# Patient Record
Sex: Male | Born: 1960 | Race: White | Hispanic: No | Smoking: Current every day smoker
Health system: Southern US, Community
[De-identification: ages and names within clinical notes are randomized; demographics above are authoritative.]

## PROBLEM LIST (undated history)

## (undated) DIAGNOSIS — R0600 Dyspnea, unspecified: Secondary | ICD-10-CM

## (undated) DIAGNOSIS — I2510 Atherosclerotic heart disease of native coronary artery without angina pectoris: Secondary | ICD-10-CM

## (undated) DIAGNOSIS — I219 Acute myocardial infarction, unspecified: Secondary | ICD-10-CM

## (undated) DIAGNOSIS — K219 Gastro-esophageal reflux disease without esophagitis: Secondary | ICD-10-CM

## (undated) DIAGNOSIS — F1010 Alcohol abuse, uncomplicated: Secondary | ICD-10-CM

## (undated) HISTORY — DX: Gastro-esophageal reflux disease without esophagitis: K21.9

## (undated) HISTORY — DX: Atherosclerotic heart disease of native coronary artery without angina pectoris: I25.10

## (undated) HISTORY — DX: Alcohol abuse, uncomplicated: F10.10

## (undated) HISTORY — DX: Acute myocardial infarction, unspecified: I21.9

---

## 2001-05-27 ENCOUNTER — Encounter: Payer: Self-pay | Admitting: Emergency Medicine

## 2001-05-27 ENCOUNTER — Emergency Department (HOSPITAL_COMMUNITY): Admission: EM | Admit: 2001-05-27 | Discharge: 2001-05-27 | Payer: Self-pay | Admitting: Emergency Medicine

## 2003-04-16 DIAGNOSIS — I219 Acute myocardial infarction, unspecified: Secondary | ICD-10-CM

## 2003-04-16 HISTORY — DX: Acute myocardial infarction, unspecified: I21.9

## 2003-08-26 ENCOUNTER — Encounter (INDEPENDENT_AMBULATORY_CARE_PROVIDER_SITE_OTHER): Payer: Self-pay | Admitting: *Deleted

## 2003-08-26 ENCOUNTER — Inpatient Hospital Stay (HOSPITAL_COMMUNITY): Admission: EM | Admit: 2003-08-26 | Discharge: 2003-08-28 | Payer: Self-pay | Admitting: Emergency Medicine

## 2003-08-31 ENCOUNTER — Inpatient Hospital Stay (HOSPITAL_COMMUNITY): Admission: EM | Admit: 2003-08-31 | Discharge: 2003-09-01 | Payer: Self-pay | Admitting: Emergency Medicine

## 2003-10-27 ENCOUNTER — Inpatient Hospital Stay (HOSPITAL_COMMUNITY): Admission: EM | Admit: 2003-10-27 | Discharge: 2003-10-28 | Payer: Self-pay | Admitting: Emergency Medicine

## 2003-12-09 ENCOUNTER — Encounter: Admission: RE | Admit: 2003-12-09 | Discharge: 2003-12-09 | Payer: Self-pay | Admitting: Gastroenterology

## 2003-12-22 ENCOUNTER — Emergency Department (HOSPITAL_COMMUNITY): Admission: EM | Admit: 2003-12-22 | Discharge: 2003-12-22 | Payer: Self-pay | Admitting: *Deleted

## 2004-02-19 ENCOUNTER — Emergency Department (HOSPITAL_COMMUNITY): Admission: EM | Admit: 2004-02-19 | Discharge: 2004-02-19 | Payer: Self-pay | Admitting: Emergency Medicine

## 2004-03-31 ENCOUNTER — Emergency Department (HOSPITAL_COMMUNITY): Admission: EM | Admit: 2004-03-31 | Discharge: 2004-03-31 | Payer: Self-pay

## 2006-05-15 IMAGING — CR DG CHEST 1V PORT
1 series · 1 of 1 positions shown · non-contrast
Comparison: 08/31/03.

CLINICAL DATA: chest pain
 PORTABLE CHEST ONE VIEW (6200 hours)

[view not recorded]
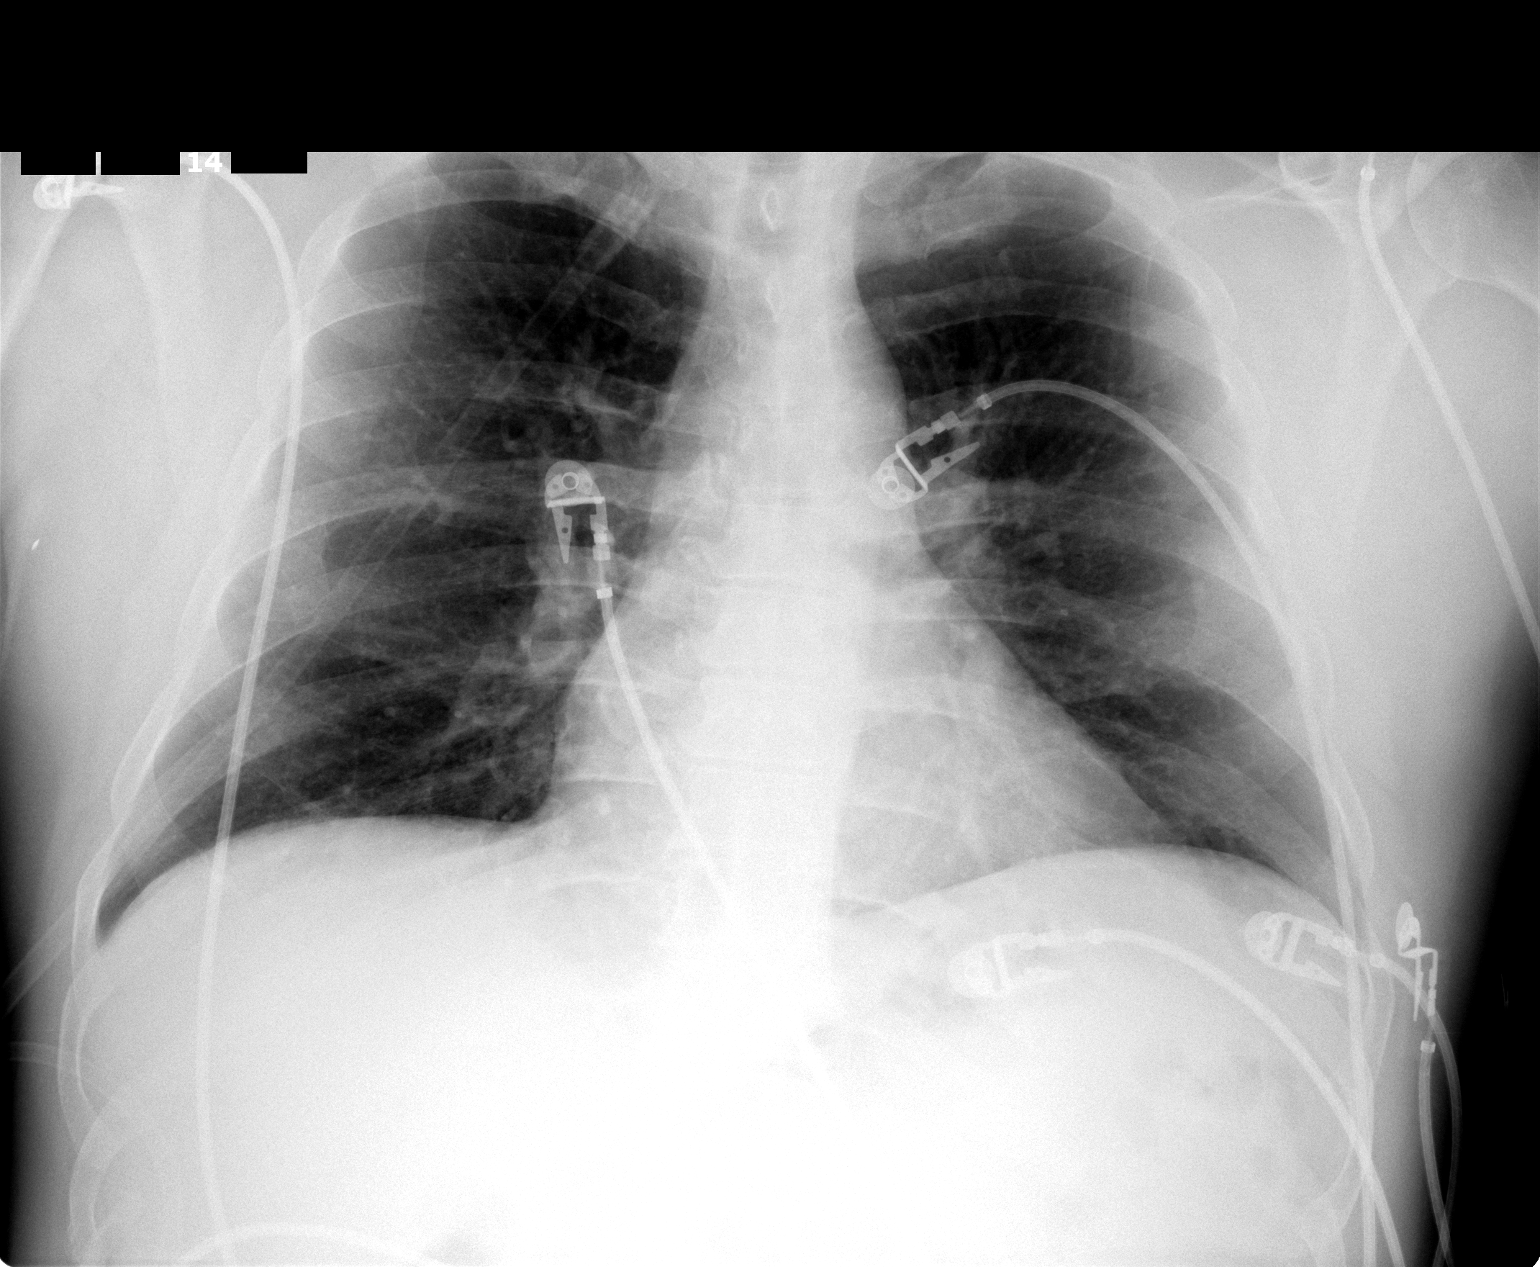

[1 of 1 positions shown; findings below may reference images not displayed]

FINDINGS: Telemetry leads and oxygen tubing overlie the chest.  Lungs are clear.  No CHF or pneumonia.  No effusion or pneumothorax. Heart size is normal.
 IMPRESSION
 No acute chest disease. Stable exam.

## 2008-07-12 ENCOUNTER — Emergency Department (HOSPITAL_COMMUNITY): Admission: EM | Admit: 2008-07-12 | Discharge: 2008-07-12 | Payer: Self-pay | Admitting: Emergency Medicine

## 2008-07-17 ENCOUNTER — Emergency Department (HOSPITAL_COMMUNITY): Admission: EM | Admit: 2008-07-17 | Discharge: 2008-07-17 | Payer: Self-pay | Admitting: Emergency Medicine

## 2010-06-11 ENCOUNTER — Emergency Department (HOSPITAL_COMMUNITY)
Admission: EM | Admit: 2010-06-11 | Discharge: 2010-06-11 | Discharge status: Against Medical Advice | Payer: Medicare Other | Attending: Emergency Medicine | Admitting: Emergency Medicine

## 2010-06-11 ENCOUNTER — Emergency Department (HOSPITAL_COMMUNITY): Payer: Medicare Other

## 2010-06-11 DIAGNOSIS — F1010 Alcohol abuse, uncomplicated: Secondary | ICD-10-CM | POA: Insufficient documentation

## 2010-06-11 DIAGNOSIS — R071 Chest pain on breathing: Secondary | ICD-10-CM | POA: Insufficient documentation

## 2010-06-11 DIAGNOSIS — R0789 Other chest pain: Secondary | ICD-10-CM

## 2010-06-11 LAB — DIFFERENTIAL
Basophils Absolute: 0 10*3/uL (ref 0.0–0.1)
Basophils Relative: 0 % (ref 0–1)
Eosinophils Absolute: 0 10*3/uL (ref 0.0–0.7)
Lymphs Abs: 2.2 10*3/uL (ref 0.7–4.0)
Monocytes Relative: 8 % (ref 3–12)

## 2010-06-11 LAB — PROTIME-INR: INR: 0.94 (ref 0.00–1.49)

## 2010-06-11 LAB — CBC
HCT: 47 % (ref 39.0–52.0)
Hemoglobin: 16.7 g/dL (ref 13.0–17.0)
MCH: 34.2 pg — ABNORMAL HIGH (ref 26.0–34.0)
MCHC: 35.5 g/dL (ref 30.0–36.0)
MCV: 96.1 fL (ref 78.0–100.0)
RBC: 4.89 MIL/uL (ref 4.22–5.81)
WBC: 7 10*3/uL (ref 4.0–10.5)

## 2010-06-11 LAB — POCT CARDIAC MARKERS: Troponin i, poc: <0.05 ng/mL (ref 0.00–0.09)

## 2010-06-11 LAB — POCT I-STAT, CHEM 8
Chloride: 107 mEq/L (ref 96–112)
Creatinine, Ser: 1.1 mg/dL (ref 0.4–1.5)
Glucose, Bld: 92 mg/dL (ref 70–99)
Hemoglobin: 17.3 g/dL — ABNORMAL HIGH (ref 13.0–17.0)
TCO2: 22 mmol/L (ref 0–100)

## 2010-06-11 LAB — COMPREHENSIVE METABOLIC PANEL
ALT: 86 U/L — ABNORMAL HIGH (ref 0–53)
AST: 121 U/L — ABNORMAL HIGH (ref 0–37)
Albumin: 4.3 g/dL (ref 3.5–5.2)
Alkaline Phosphatase: 95 U/L (ref 39–117)
Chloride: 104 mEq/L (ref 96–112)
GFR calc Af Amer: >60 mL/min (ref >60)
GFR calc non Af Amer: >60 mL/min (ref >60)
Potassium: 4.1 mEq/L (ref 3.5–5.1)
Sodium: 138 mEq/L (ref 135–145)
Total Bilirubin: 0.8 mg/dL (ref 0.3–1.2)

## 2010-06-11 LAB — CK TOTAL AND CKMB (NOT AT ARMC)
CK, MB: 12.4 ng/mL (ref 0.3–4.0)
Relative Index: 1.4 (ref 0.0–2.5)

## 2010-06-11 LAB — ETHANOL: Alcohol, Ethyl (B): 214 mg/dL — ABNORMAL HIGH (ref 0–10)

## 2010-06-11 LAB — TROPONIN I: Troponin I: 0.03 ng/mL (ref 0.00–0.06)

## 2010-06-22 NOTE — Consult Note (Signed)
NAME:  Noah, Rosales NO.:  [id number]  MEDICAL RECORD NO.:  [id number]           PATIENT TYPE:  LOCATION:                                 FACILITY:  PHYSICIAN:  Peter M. Swaziland, M.D.  DATE OF BIRTH:  1960-09-19  DATE OF CONSULTATION: DATE OF DISCHARGE:                                CONSULTATION   DATE OF CONSULTATION:  June 11, 2010.  The patient was formerly followed by [facility], but has not followed up recently nor does he have a primary care physician.  REASON FOR CONSULTATION:  Chest discomfort.  HISTORY OF PRESENT ILLNESS:  Noah Rosales is a 50 year old Caucasian male with a known history of coronary artery disease, having had acute inferior MI in May 2005 with subsequent DES to the proximal right coronary artery secondary to 99% stenosis and mild-to-moderate disease in LAD and left circumflex systems at that time as well as peripheral arterial disease with a drug-eluting stent to the right femoral artery, hyperlipidemia, normal endoscopy in 2005, but per patient, history of GERD as well as ongoing heavy tobacco abuse and EtOH abuse who presents now for further evaluation of chest discomfort.  The patient was in his usual state of health, which unfortunately includes chronic mild-to-moderate chest discomfort and upper extremity discomfort in his shoulders that he feels is most closely associated with eating spicy foods.  Yesterday, he ate some spicy foods, and this morning, a few minutes after waking up, he noticed significant worsening of his typical chest discomfort.  When pressed, he does state that it is very similar to his prior angina during his MI in 2005, but he also says it is very similar to the chest discomfort he experiences on a very regular basis that is most closely associated with spicy food, it is just more severe.  The patient has not been taking any medications for at least a year and continues to smoke two packs per day  and has been doing so for 40+ years.  He continues to drink significant amount of alcohol, with a blood level today of 214.  Per his report, approximately 10 drinks per day, but no illicit drugs.  He has had no exertional symptoms recently, but he does not exert himself and is on disability. The patient has been mildly hypertensive with peak systolics in the 140s and peak diastolic at 90, otherwise normal sinus rhythm on his EKG and telemetry.  Other vital signs within normal limits and stable.  Chest x- ray without significant changes, prior tracing.  Point-of-care markers does have mildly elevated CK-MB, but troponin is negative.  Otherwise labs remarkable for AST of 121, ALT of 86, lipase of 25, and of course, EtOH of 214.  The patient does appear slightly inebriated at the moment, but is in no acute distress.  PAST MEDICAL HISTORY: 1. CAD.     a.     Acute inferior MI with subsequent cardiac cath, Aug 26, 2003:  DES to proximal RCA secondary to 99% stenosis, residual 30%      to 50% stenosis in LAD and left  circumflex system.     b.     A 2-D echocardiogram, May 2005 with LVEF 45% to 50% and mild      hypokinesis of the mid-distal anterior wall. 2. PAD.     a.     Lower extremity catheterization, Aug 26, 2003:  DES to right      femoral artery. 3. Hyperlipidemia. 4. GERD.     a.     Endoscopy, 2005, without significant findings.  SOCIAL HISTORY:  The patient lives in [place] with his girlfriend.  He is on disability.  An 80+ pack-year smoking history, currently smoking two ppd and 10 alcoholic drinks per day.  No illicit drugs.  No herbal meds. Regular diet.  No regular exercise.  FAMILY HISTORY:  Markedly positive with father who had first MI at age 25 and deceased at age 20 after six heart attacks in total.  A brother who had CABG x3 at age 60.  Mother with coronary disease, but not prematurely; deceased at age 89.  REVIEW OF SYSTEMS:  Please see HPI.  All other  systems were reviewed and were negative.  The patient has had some mild nausea, but no vomiting. He has had some mild shortness of breath with his symptoms, but this has resolved.  No orthopnea, PND, lower extremity edema, palpitations, presyncope; chronic cough, unchanged.  No diaphoresis.  No fevers, chills, bleeding, dark stools, constipation, diarrhea, urinary frequency, or nocturia.  No change to chronic mild weakness/fatigue.  No significant change in mood symptoms or other significant changes.  CODE STATUS:  Full.  ALLERGIES:  NKDA.  MEDICATIONS:  No home medications.  In the emergency department, given 0.5 inch of Nitropaste.  PHYSICAL EXAMINATION:  VITAL SIGNS:  Temperature 97.4 degrees Fahrenheit with BP 144/88 and pulse at 82, respirations 18, and O2 saturation 100% on room air. GENERAL:  The patient is alert and oriented x3.  He is in no apparent distress, able to speak easily in full sentences without respiratory distress.  He does appear disheveled and slightly inebriated. HEENT:  Head is normocephalic, atraumatic.  Pupils equal, round, and reactive to light.  Extraocular muscles are intact.  Nares are patent without discharge.  Dentition is poor.  Oropharynx without erythema or exudates. NECK:  Supple without lymphadenopathy.  No thyromegaly.  No JVD. HEART:  Rate is regular with audible S1 and S2.  No clicks, rubs, murmurs or gallops and pulses are 2+ and equal in both upper and lower extremities bilaterally. LUNGS:  Clear to auscultation bilaterally. SKIN:  No rashes, no petechiae. ABDOMEN:  Soft, nontender, nondistended.  Normal abdominal bowel sounds. No rebound or guarding.  The patient is obese.  No obvious hepatosplenomegaly. EXTREMITIES:  Without clubbing, cyanosis, or edema. MUSCULOSKELETAL:  Without joint deformity or effusions.  No spinal or CVA tenderness. NEUROLOGIC:  Cranial nerves II-XII grossly intact.  Strength is 5/5 in all extremities and axial  groups.  Normal sensation and normal cerebellar function (as assessed in the hospital bed only).  RADIOLOGY:  No significant changes noted on his chest x-ray when compared to a prior chest x-ray from July 17, 2008.  EKG:  Rhythm NSR, rate 68 bpm with no significant ST-T wave changes; very small Q-waves in inferior leads, question significance; normal axis, no evidence of hypertrophy.  PR 148, QRS 70, and QTc 408.  When compared to a prior tracing from July 12, 2008, the tracing actually looks improved; previously had some mild T-wave inversion in inferior leads.  LABORATORY DATA:  WBC is 7.0 with HGB of 16.7, HCT 47.0, and PLT count is 203.  Sodium 138, potassium 4.1, chloride 104, bicarb 24, BUN 7, creatinine 0.82, glucose 93, AST 121, and ALT 86.  Point-of-care markers notable for a negative troponin, but CK-MB of 8.8, lipase 25, and EtOH 214.  ASSESSMENT/PLAN:  The patient seen by both Jarrett Ables, PA-C and attending cardiologist, Dr. Peter Swaziland.  Mr. Cottrill is a 50 year old Caucasian gentleman with a known history of coronary artery disease as well as history significant for peripheral arterial disease, hyperlipidemia, heavy ongoing tobacco abuse and EtOH abuse, as well as very strong family history and very poor medical compliance who now presents with chest discomfort with both typical and atypical features, with minimal objective evidence of a cardiac etiology thus far.  The patient refuses admission for further evaluation.  He also refuses to quit smoking or drinking EtOH.  He expresses no interest in taking any medications.  The patient received extensive education/instruction on the importance of tobacco cessation, EtOH cessation, good medical compliance in order to allow for further evaluation by a cardiac catheterization or any other ischemic workup. The patient refuses any workup anyway, but also refuses to make any lifestyle changes. The patient was also  instructed as to his likely premature death given his high-risk lifestyle, especially given known comorbidities.  The patient refuses any further care.     Jarrett Ables, PAC   ______________________________ Peter M. Swaziland, M.D.    MS/MEDQ  D:  06/11/2010  T:  06/12/2010  Job:  [id number]  Electronically Signed by Jarrett Ables PAC on 06/19/2010 04:29:36 PM Electronically Signed by PETER Swaziland M.D. on 06/22/2010 11:16:53 AM

## 2010-07-25 LAB — POCT CARDIAC MARKERS
CKMB, poc: 2.1 ng/mL (ref 1.0–8.0)
Myoglobin, poc: 109 ng/mL (ref 12–200)
Troponin i, poc: <0.05 ng/mL (ref 0.00–0.09)

## 2010-07-25 LAB — HEPATIC FUNCTION PANEL
Albumin: 3.9 g/dL (ref 3.5–5.2)
Alkaline Phosphatase: 101 U/L (ref 39–117)
Bilirubin, Direct: 0.2 mg/dL (ref 0.0–0.3)
Indirect Bilirubin: 0.6 mg/dL (ref 0.3–0.9)
Total Protein: 7.5 g/dL (ref 6.0–8.3)

## 2010-07-25 LAB — CBC
HCT: 46.5 % (ref 39.0–52.0)
MCV: 97.1 fL (ref 78.0–100.0)
Platelets: 218 10*3/uL (ref 150–400)
RBC: 4.8 MIL/uL (ref 4.22–5.81)
WBC: 6.2 10*3/uL (ref 4.0–10.5)

## 2010-07-25 LAB — BASIC METABOLIC PANEL
CO2: 27 mEq/L (ref 19–32)
Creatinine, Ser: 0.78 mg/dL (ref 0.4–1.5)
GFR calc Af Amer: >60 mL/min (ref >60)
GFR calc non Af Amer: >60 mL/min (ref >60)
Sodium: 141 mEq/L (ref 135–145)

## 2010-07-25 LAB — POCT I-STAT, CHEM 8
BUN: 9 mg/dL (ref 6–23)
Calcium, Ion: 1.11 mmol/L — ABNORMAL LOW (ref 1.12–1.32)
Glucose, Bld: 109 mg/dL — ABNORMAL HIGH (ref 70–99)
Potassium: 4 mEq/L (ref 3.5–5.1)

## 2010-07-25 LAB — MAGNESIUM: Magnesium: 2.2 mg/dL (ref 1.5–2.5)

## 2010-07-26 LAB — POCT I-STAT, CHEM 8
BUN: 4 mg/dL — ABNORMAL LOW (ref 6–23)
Calcium, Ion: 1.05 mmol/L — ABNORMAL LOW (ref 1.12–1.32)
Creatinine, Ser: 1 mg/dL (ref 0.4–1.5)
Glucose, Bld: 74 mg/dL (ref 70–99)
TCO2: 24 mmol/L (ref 0–100)

## 2010-08-31 NOTE — Op Note (Signed)
NAME:  ENDER, RORKE                         ACCOUNT NO.:  [id number]   MEDICAL RECORD NO.:  [id number]                   PATIENT TYPE:  INP   LOCATION:  2041                                 FACILITY:  [facility]   PHYSICIAN:  Fayrene Fearing L. Malon Kindle., M.D.          DATE OF BIRTH:  [birth year]   DATE OF PROCEDURE:  10/28/2003  DATE OF DISCHARGE:  10/28/2003                                 OPERATIVE REPORT   PROCEDURE:  Esophagogastroduodenoscopy.   INDICATIONS:  Patient is admitted.  Had recent dysphagia.  Also had a recent  heart attack, followed by Dr. Amil Amen.   DESCRIPTION OF PROCEDURE:  The procedure had been explained to the patient  and consent obtained.  With the patient in the left lateral decubitus  position, the Olympus scope was inserted and advanced.  The pharynx was  completely normal.  The esophagus was seen well.  The stomach was entered.  The pylorus identified and passed to the duodenum, including the bulb and  second portion.  The __________ was normal.  The pylori channel, antrum and  body of the stomach were normal.  No ulceration or inflammation.  The fundus  and cardia seen well on the retroflexed view and were normal.  The GE  junction was widely patent.  There were free reflux with no evidence of  stricture.  The distal and proximal esophagus endoscopically normal.  The  scope withdrawn.  The patient tolerated the procedure well.   ASSESSMENT:  1. Dysphagia.  2. An essentially normal endoscopy.   PLAN:  We will add Reglan to his medical regimen.  Feel it is okay to go  ahead and discharge.  We will see him back in the office in 4-6 weeks.                                               James L. Malon Kindle., M.D.    Waldron Session  D:  10/28/2003  T:  10/28/2003  Job:  [id number]   cc:   Francisca December, M.D.  [address]  [address]  [place]  [place] [address]  Fax: [phone]

## 2010-08-31 NOTE — Consult Note (Signed)
NAME:  Noah Rosales, Noah Rosales                         ACCOUNT NO.:  [id number]   MEDICAL RECORD NO.:  [id number]                   PATIENT TYPE:  INP   LOCATION:  2041                                 FACILITY:  [facility]   PHYSICIAN:  Fayrene Fearing L. Malon Kindle., M.D.          DATE OF BIRTH:  [birth year]   DATE OF CONSULTATION:  DATE OF DISCHARGE:                                   CONSULTATION   REFERRING PHYSICIAN:  Dr. Corliss Marcus   HISTORY:  A 50 year old gentleman who was hospitalized back in May.  Had an  inferior wall MI and underwent cardiac catheterization, angioplasty, and  stent implantation in the right coronary artery.  This went quite well.  He  had a good ejection fraction, went home.  He was brought back into the  hospital yesterday with chest pressure and sensations of pain in his neck.  Enzymes have been negative.  It is not felt by cardiology that these  symptoms were due to cardiac disease at this time.  The patient and his  wife/girlfriend indicate that he has trouble swallowing, particularly pills  and meat and at times will feel like it is stuck in his chest, cause pain  across his chest and in his neck.  Then, at times, he is able to eat  anything and swallow anything he wants without difficulty.  He has had no  heartburn.  Does not take any chronic antacids.  He has been on Prevacid for  a month and that really has not seemed to help.  He notes that if he belches  things get better.  He denies any abdominal pain.  He often wakes up at  night with a pressure in his chest, sits up, drinks something, and feels  better.  Often wakes up coughing and has a sour stomach acid taste in his  mouth.   MEDICATIONS ON ADMISSION:  1. Prevacid.  2. Altace.  3. Plavix.  4. Aspirin.  5. Metoprolol.  6. Nitroglycerin p.r.n.   ALLERGIES:  He has no drug allergies.   PAST MEDICAL HISTORY:  1. Recent inferior MI with angioplasty and stent.  2. No chronic medical problems other than  elevated cholesterol.  3. He has never had any previous surgeries.   FAMILY HISTORY:  Father died of a heart attack.  Mother died of unclear  causes.  No family history of cancer.   SOCIAL HISTORY:  Works in [employer].  Has been out of work since his  heart attack.  He has had a long-term live-in relationship with his  girlfriend.  He smoked for 35 years and drinks two to three beers when he  drinks, but often goes several days without drinking.   REVIEW OF SYSTEMS:  Remarkable for lack of melenic stools, lack of previous  history of ulcers or chronic heartburn.  He has had no recent melena,  hematochezia, or change in his bowel movements.  He has  had no liver  disease, hepatitis.  Never had his gallbladder checked.   PHYSICAL EXAMINATION:  VITAL SIGNS:  Unremarkable.  GENERAL:  Alert white male.  HEENT:  Eyes:  Sclerae nonicteric.  Throat is normal.  No lymphadenopathy.  Teeth reveal poor dentition.  No lesions in his pharynx.  LUNGS:  Clear.  HEART:  Regular rate and rhythm without murmurs or gallops.  ABDOMEN:  Obese, soft, and nontender.   ASSESSMENT:  Chest pain and dysphagia of unclear cause.  Cardiac enzymes are  negative.  Clearly, there is some element of gastrointestinal problems and  this may all be due to reflux but the patient has not really gotten much  relief with Prevacid.  I agree an endoscopy would be appropriate.   PLAN:  Will go ahead with an endoscopy today.  I have discussed this with  the patient and his wife/girlfriend and will try to get this done this  afternoon.                                               James L. Malon Kindle., M.D.    Waldron Session  D:  10/28/2003  T:  10/28/2003  Job:  [id number]

## 2010-08-31 NOTE — Cardiovascular Report (Signed)
NAME:  Noah Rosales, Noah Rosales NO.:  [id number]   MEDICAL RECORD NO.:  [id number]                   PATIENT TYPE:  INP   LOCATION:  1824                                 FACILITY:  [facility]   PHYSICIAN:  Francisca December, M.D.               DATE OF BIRTH:  12-Jan-1961   DATE OF PROCEDURE:  08/26/2003  DATE OF DISCHARGE:                              CARDIAC CATHETERIZATION   REFERRING PHYSICIAN:  Carleene Cooper, M.D.   PROCEDURES PERFORMED:  1. Coronary angiography  2. Percutaneous coronary intervention/stent implantation, right coronary     artery, proximal.  3. Right femoral arteriogram.  4. Angio-Seal percutaneous closure, right femoral artery.   CARDIOLOGIST:  Francisca December, M.D.   INDICATIONS:  Noah Rosales is a 50 year old man who is now  approximately 2.5 hours  into an acute inferior wall myocardial infarction.  He presented to [facility] emergency room this morning at about 10 A.M. following  the onset of anterior substernal chest pain radiating through to the back  and left shoulder at 0800 this morning.  He did have similar pain yesterday  associated with nausea and diaphoresis, which lasted about one hour.  He did  not seek medical care at that time.  The electrocardiogram was diagnostic  for an acute inferior wall myocardial infarction.  He is brought now to the  cardiac catheterization laboratory to provide for acute angioplasty abdomen  reperfusion.   PROCEDURAL NOTE:  The patient was brought to the cardiac catheterization  laboratory where the right groin was prepped and draped in the usual sterile  fashion.  Local anesthesia was obtained with the infiltration of the 1%  lidocaine.  A 7 French catheter and sheath were inserted percutaneously into  the right femoral artery utilizing an anterior approach over a guiding J-  wire.  A 100 cm 6 [place] #4 left Judkins diagnostic catheter was advanced  into the ascending aorta where the pressure was  recorded.  The left coronary  artery os was engaged and cine angiography performed in multiple LAO and RAO  projections.  The #4 6 French left Judkins catheter was exchanged for a 7  [place] #4 right FR-4 guiding catheter.  Cine angiography of the right  coronary artery was conducted in the LAO and RAO projections.  An ACT was  obtained and found to be 188 seconds.  Three-thousand units of heparin was  administered as well as a double bolus of some Integrilin and constant  infusion.  The lesion was crossed with a 0.014 inch Sci-Med intracoronary  guidewire without difficulty.  The lesion was primarily stented using a  3.5/20 mm Sci-Med TAXUS drug-eluting intracoronary stent.  The device was  deployed at a peak pressure of 12 atmospheres for one minute.  The stent  balloon was removed and a 3.5/15 mm Sci-Med Quantum Maverick intracoronary  balloon was placed in the more proximal segment of the stent.  It was  inflated to a peak pressure of 20 atmospheres for approximately 30 seconds.  This balloon was deflated and removed; and, cine angiography performed in  multiple LAO and RAO projections to confirm adequate patency both with an  without the guidewire in place.  The guiding catheter was then removed.   A 45-degree cine angiogram of the right femoral artery was performed  utilizing the side port of the arterial sheath.  A 40-degree LAO angiogram  was also performed, again using hand injection.  This documented the  arteriotomy site to be well above the bifurcation and to the profunda  femoris, and superficial femoral arteries.  There was no significant  atherosclerotic disease present.  I then proceeded with percutaneous closure  using __________, which was successful.  Repeat ACT prior to removal of the  sheath was 324 seconds.   HEMODYNAMIC DATA:  Systemic arterial pressure was 103/74 with a mean of 88  mmHg.   ANGIOGRAPHIC DATA:  There was a right dominant coronary system present.    Main Left Coronary Artery:  The main left coronary artery was normal.   Left Anterior Descending Artery:  The left anterior descending artery and  its branches was minimally-to-moderately diseased.  There were no high-grade  stenoses.  There was diffuse atherosclerosis proximally and in the  midportion.  There was some calcification present.  Two small-to-moderate  sized diagonal branches arise.  There is a 50% narrowing in the origin of  the first marginal.  There is a 30% narrowing in the midportion of the LAD,  which is relatively focal.  There is a 40% stenosis in the mid-to-distal  portion of the LAD just before the origin of the second diagonal branch.  The ongoing anterior descending artery reaches and barely traverses the  apex.   Left Circumflex Coronary Artery:  The left circumflex coronary artery shows  diffuse atherosclerosis; but, again without a high-grade stenosis.  There is  a 40% stenosis, which is focal in the proximal segment.  Two small marginal  branches arise and then there is a large third marginal followed by a small  fourth marginal.  Between the first and second marginals there is an  eccentric 40% stenosis, which is tubular to diffuse in nature.  Distal to  this there are no significant obstructions.   Right Coronary Artery:  The right coronary artery and its branches was  highly diseased.  The vessel was patent on initial angiography.  There was a  subtotal 99% stenosis in the proximal segment.  The lesion was diffuse  extending over greater than 20 mm, although the most focal portion of the  stenosis was only 3-4 mm.  Ongoing right coronary is without significant  obstruction.  It does bifurcate into a moderate-to-large posterior  descending artery, and a moderate posterolateral segment with one left  ventricular branch.  None of these distal branches have significant  obstruction, although there is a cutoff in the very distal portion of the PDA.  Following  balloon dilatation and stent implantation in the proximal  right coronary there is no residual stenosis.  TIMI Grade III flow was  present both prior to and after the stent implantation.  After stent  implantation there was complete distal flow in the PDA with typical sub-  branches identified.   FINAL IMPRESSION:  1. Atherosclerotic coronary vascular disease, single-vessel, but with     diffuse mild-to-moderate disease in the left coronary artery.  2. Acute inferior wall myocardial infarction  with spontaneous recanalization     after heparin administered in the emergency room.  3. Status post successful percutaneous coronary intervention/drug-eluting     stent implantation, proximal right coronary.  4. Successful percutaneous closure of the right femoral artery.                                               Francisca December, M.D.    JHE/MEDQ  D:  08/26/2003  T:  08/27/2003  Job:  [id number]

## 2010-10-09 ENCOUNTER — Emergency Department (HOSPITAL_COMMUNITY): Payer: Medicare Other

## 2010-10-09 ENCOUNTER — Emergency Department (HOSPITAL_COMMUNITY)
Admission: EM | Admit: 2010-10-09 | Discharge: 2010-10-09 | Discharge status: Against Medical Advice | Payer: Medicare Other | Attending: Emergency Medicine | Admitting: Emergency Medicine

## 2010-10-09 DIAGNOSIS — R079 Chest pain, unspecified: Secondary | ICD-10-CM | POA: Insufficient documentation

## 2010-10-09 DIAGNOSIS — I252 Old myocardial infarction: Secondary | ICD-10-CM | POA: Insufficient documentation

## 2010-10-09 DIAGNOSIS — Z9861 Coronary angioplasty status: Secondary | ICD-10-CM | POA: Insufficient documentation

## 2010-10-09 DIAGNOSIS — R0602 Shortness of breath: Secondary | ICD-10-CM | POA: Insufficient documentation

## 2010-10-09 DIAGNOSIS — I2510 Atherosclerotic heart disease of native coronary artery without angina pectoris: Secondary | ICD-10-CM | POA: Insufficient documentation

## 2010-10-09 DIAGNOSIS — I998 Other disorder of circulatory system: Secondary | ICD-10-CM | POA: Insufficient documentation

## 2010-10-09 DIAGNOSIS — R110 Nausea: Secondary | ICD-10-CM | POA: Insufficient documentation

## 2010-10-09 DIAGNOSIS — I10 Essential (primary) hypertension: Secondary | ICD-10-CM | POA: Insufficient documentation

## 2010-10-09 DIAGNOSIS — J438 Other emphysema: Secondary | ICD-10-CM | POA: Insufficient documentation

## 2010-10-09 LAB — COMPREHENSIVE METABOLIC PANEL
ALT: 195 U/L — ABNORMAL HIGH (ref 0–53)
Alkaline Phosphatase: 101 U/L (ref 39–117)
CO2: 25 mEq/L (ref 19–32)
Calcium: 9.3 mg/dL (ref 8.4–10.5)
GFR calc Af Amer: >60 mL/min (ref >60)
GFR calc non Af Amer: >60 mL/min (ref >60)
Glucose, Bld: 102 mg/dL — ABNORMAL HIGH (ref 70–99)
Sodium: 139 mEq/L (ref 135–145)

## 2010-10-09 LAB — DIFFERENTIAL
Basophils Relative: 0 % (ref 0–1)
Eosinophils Absolute: 0.1 10*3/uL (ref 0.0–0.7)
Eosinophils Relative: 2 % (ref 0–5)
Monocytes Absolute: 0.4 10*3/uL (ref 0.1–1.0)
Monocytes Relative: 6 % (ref 3–12)
Neutro Abs: 4.4 10*3/uL (ref 1.7–7.7)

## 2010-10-09 LAB — CK TOTAL AND CKMB (NOT AT ARMC): Total CK: 478 U/L — ABNORMAL HIGH (ref 7–232)

## 2010-10-09 LAB — CBC
Hemoglobin: 17.2 g/dL — ABNORMAL HIGH (ref 13.0–17.0)
MCH: 34.7 pg — ABNORMAL HIGH (ref 26.0–34.0)
MCHC: 36.1 g/dL — ABNORMAL HIGH (ref 30.0–36.0)

## 2010-11-13 ENCOUNTER — Inpatient Hospital Stay (HOSPITAL_BASED_OUTPATIENT_CLINIC_OR_DEPARTMENT_OTHER)
Admission: RE | Admit: 2010-11-13 | Discharge: 2010-11-13 | Disposition: A | Discharge status: Home / Self Care | Payer: Medicare Other | Source: Ambulatory Visit | Attending: Cardiology | Admitting: Cardiology

## 2010-11-13 DIAGNOSIS — Z9861 Coronary angioplasty status: Secondary | ICD-10-CM | POA: Insufficient documentation

## 2010-11-13 DIAGNOSIS — I2510 Atherosclerotic heart disease of native coronary artery without angina pectoris: Secondary | ICD-10-CM | POA: Insufficient documentation

## 2010-11-13 DIAGNOSIS — R079 Chest pain, unspecified: Secondary | ICD-10-CM | POA: Insufficient documentation

## 2010-11-15 NOTE — Cardiovascular Report (Signed)
NAME:  Noah Rosales, Noah Rosales NO.:  [id number]  MEDICAL RECORD NO.:  [id number]  LOCATION:                                 FACILITY:  PHYSICIAN:  Armanda Magic, M.D.     DATE OF BIRTH:  [birth year]  DATE OF PROCEDURE:  11/13/2010 DATE OF DISCHARGE:                           CARDIAC CATHETERIZATION   PROCEDURES: 1. Left heart catheterization. 2. Coronary angiography. 3. Left ventriculography.  OPERATOR:  Armanda Magic, MD  INDICATIONS:  History of CAD with new onset of chest pain.  INTRAVENOUS ACCESS:  Via right femoral artery 4-French sheath.  INTRAVENOUS MEDICATIONS:  Fentanyl 25 mcg and Versed 2 mg.  COMPLICATIONS:  None.  This is a 50 year old male with a history of coronary artery disease in the past with a right coronary artery stent in the past, who now presents with episodes of chest pain.  The patient was brought to the Cardiac Catheterization Laboratory in a fasting nonsedated state.  Informed consent was obtained.  The patient was connected to continuous heart rate and pulse oximetry monitoring and intermittent blood pressure monitoring.  The right groin was prepped and draped in a sterile fashion.  A 1% Xylocaine was used for local anesthesia.  Using a modified Seldinger technique, a 4-French sheath was placed in right femoral artery.  Under fluoroscopic guidance a 4-French JL-4 catheter was placed in left coronary artery but could not engage the coronary ostium adequately.  Catheter was exchanged out over a guidewire for a 4-French JL-5 catheter which successfully engaged the left coronary ostium.  Multiple cine films were taken at 30-degree RAO, 40-degree LAO views.  This catheter was then exchanged out over a guidewire for a 4-French 3-D RCA catheter which was placed under fluoroscopic guidance into the area of the right coronary artery but could not successfully engage the coronary ostium.  The catheter was exchanged out over a guidewire for a  4-French JR-4 catheter which again could not adequately engage the coronary ostium.  The catheter was exchanged out over a guidewire for a 4-French Amplatz catheter which successfully engaged the right coronary ostium.  Multiple cine films were taken at 30-degree RAO and 40-degree LAO views.  There appeared to be some vasospasm in the proximal portion of the right coronary artery and 200 mcg of intracoronary nitroglycerin was administered.  Multiple cine films again were taken then in the 30-degree RAO and 40-degree LAO views.  This catheter was then removed over a guide wire and a 4-French angled pigtail catheter was placed under fluoroscopic guidance into the left ventricular cavity.  Left ventriculography was performed in the 30- degree RAO view using a total of 25 mL of contrast at 12 mL per second. The catheter was then pulled back across the aortic valve with no significant gradient noted.  At the end of the procedure, all catheters and sheaths were removed.  Manual compression was performed until adequate hemostasis was obtained.  The patient was transferred back to the room in stable condition.  RESULTS: 1. The left main coronary artery is widely patent and bifurcates into     left anterior descending artery and left circumflex artery. 2. The left anterior descending artery  is widely patent in its     proximal portion giving rise to a moderate-sized first diagonal     branch.  Just distal to the first diagonal, there is a 50-70%     moderately long tubular stenosis of the mid LAD before giving rise     to a second diagonal branch which is widely patent.  The ongoing     LAD is patent and traverses to the apex.  There is evidence of left-     to-right collateral feeding the distal RCA of the LAD. 3. Left circumflex gives rise to a first small obtuse marginal branch     and there is a 70-80% focal stenosis in the left circumflex.  The     left circumflex then gives rise to a small  second obtuse marginal     branch which is widely patent.  Distally, the left circumflex has     another focal 70-80% stenosis.  The ongoing circumflex distal to     that is small but patent. 4. The right coronary artery shows a patent stent in the proximal and     midportion of the RCA.  It gives rise to a large RV marginal branch     which is widely patent.  Just distal to the end of the stent, there     is a focal stenosis of 70-80%.  Distal to that, the right coronary     artery is occluded. 5. Left ventriculography shows normal LV function, EF 60%, LVEDP 14     mmHg, aortic pressure 131/88 mmHg, LV pressure 126/10 mmHg.  ASSESSMENT: 1. Three-vessel obstructive coronary artery disease. 2. Normal left ventricular function.  PLAN:  Aspirin 325 mg daily.  We will start Imdur 30 mg daily.  [facility] was consulted while the patient was in recovery, and they will plan on seeing the patient in the office this week with plans for coronary artery bypass grafting next week.     Armanda Magic, M.D.     TT/MEDQ  D:  11/14/2010  T:  11/14/2010  Job:  [id number]  cc:   [facility] Surgery  Electronically Signed by Armanda Magic M.D. on 11/15/2010 08:54:48 AM

## 2010-11-20 ENCOUNTER — Ambulatory Visit (HOSPITAL_COMMUNITY)
Admission: RE | Admit: 2010-11-20 | Discharge: 2010-11-20 | Disposition: A | Discharge status: Home / Self Care | Payer: Medicare Other | Source: Ambulatory Visit | Attending: Surgery | Admitting: Surgery

## 2010-11-20 ENCOUNTER — Encounter (HOSPITAL_COMMUNITY)
Admission: RE | Admit: 2010-11-20 | Discharge: 2010-11-20 | Disposition: A | Discharge status: Home / Self Care | Payer: Medicare Other | Source: Ambulatory Visit | Attending: Surgery | Admitting: Surgery

## 2010-11-20 ENCOUNTER — Encounter (INDEPENDENT_AMBULATORY_CARE_PROVIDER_SITE_OTHER): Payer: Medicare Other | Admitting: Surgery

## 2010-11-20 ENCOUNTER — Encounter (HOSPITAL_COMMUNITY): Payer: Medicare Other

## 2010-11-20 ENCOUNTER — Other Ambulatory Visit: Payer: Self-pay | Admitting: Surgery

## 2010-11-20 DIAGNOSIS — I2510 Atherosclerotic heart disease of native coronary artery without angina pectoris: Secondary | ICD-10-CM

## 2010-11-20 DIAGNOSIS — Z01810 Encounter for preprocedural cardiovascular examination: Secondary | ICD-10-CM | POA: Insufficient documentation

## 2010-11-20 DIAGNOSIS — I6529 Occlusion and stenosis of unspecified carotid artery: Secondary | ICD-10-CM | POA: Insufficient documentation

## 2010-11-20 DIAGNOSIS — Z01812 Encounter for preprocedural laboratory examination: Secondary | ICD-10-CM | POA: Insufficient documentation

## 2010-11-20 LAB — PROTIME-INR: INR: 0.95 (ref 0.00–1.49)

## 2010-11-20 LAB — URINALYSIS, ROUTINE W REFLEX MICROSCOPIC
Bilirubin Urine: NEGATIVE
Hgb urine dipstick: NEGATIVE
Ketones, ur: NEGATIVE mg/dL
Specific Gravity, Urine: 1.016 (ref 1.005–1.030)
Urobilinogen, UA: 1 mg/dL (ref 0.0–1.0)
pH: 6 (ref 5.0–8.0)

## 2010-11-20 LAB — SURGICAL PCR SCREEN
MRSA, PCR: NEGATIVE
Staphylococcus aureus: POSITIVE — AB

## 2010-11-20 LAB — COMPREHENSIVE METABOLIC PANEL
AST: 37 U/L (ref 0–37)
CO2: 29 mEq/L (ref 19–32)
Calcium: 9.7 mg/dL (ref 8.4–10.5)
Chloride: 103 mEq/L (ref 96–112)
Creatinine, Ser: 0.85 mg/dL (ref 0.50–1.35)
GFR calc Af Amer: >60 mL/min (ref >60)
GFR calc non Af Amer: >60 mL/min (ref >60)
Glucose, Bld: 93 mg/dL (ref 70–99)
Total Bilirubin: 1 mg/dL (ref 0.3–1.2)

## 2010-11-20 LAB — HEMOGLOBIN A1C: Mean Plasma Glucose: 126 mg/dL — ABNORMAL HIGH (ref <117)

## 2010-11-20 LAB — CBC
HCT: 49.2 % (ref 39.0–52.0)
Hemoglobin: 17.3 g/dL — ABNORMAL HIGH (ref 13.0–17.0)
MCHC: 35.2 g/dL (ref 30.0–36.0)
RBC: 5.08 MIL/uL (ref 4.22–5.81)
WBC: 7.7 10*3/uL (ref 4.0–10.5)

## 2010-11-20 LAB — BLOOD GAS, ARTERIAL

## 2010-11-20 NOTE — Consult Note (Addendum)
NEW PATIENT CONSULTATION  Noah Rosales, Noah Rosales DOB:  Oct 25, 1960                                        November 20, 2010 CHART #:  [id number]  REASON FOR CONSULTATION:  Three-vessel coronary artery disease with unstable angina.  CLINICAL HISTORY:  I was asked by Dr. Mayford Knife to evaluate the patient for consideration for coronary artery bypass graft surgery.  He is a 50 year old gentleman with a history of heavy smoking and alcohol abuse who has a history of coronary artery disease, status post myocardial infarction and stenting of his right coronary artery in 2005.  He reports having recurrent episodes of chest discomfort over the past 8 years, although he is somewhat vague about this.  He said these episodes typically occur after eating a meal and began in his epigastrium spread up into his chest and then into both shoulders.  He said that he frequently drinks few beers and they go away.  He is apparently seen in the hospital several weeks ago due to chest discomfort and elevated CPK of 478 with an MB of 8.4 and normal troponin.  He refused admission.  He did have an outpatient nuclear stress test which showed a very small area of decreased blood flow, but I am not sure in which wall.  He underwent cardiac catheterization on November 13, 2010, which showed significant three- vessel disease.  The LAD had a 50-70% moderately long stenosis just distal to the first diagonal branch.  There were left-to-right collaterals feeding the occluded right coronary artery.  The right coronary had a patent stent in the proximal and midportion.  Just beyond the stent, there is about 70-80% stenosis and then the right coronary was occluded beyond that.  There was very faint filling of the distal vessel by collaterals in the left.  The left circumflex had a 70-80% midvessel stenosis.  There is 70-80% distal stenosis.  There is really only one large obtuse marginal branch.  Left ventricular  ejection fraction was about 60%.  REVIEW OF SYSTEMS:  GENERAL:  He denies any fever or chills.  He has had no recent weight changes.  He denies fatigue. EYES:  Negative. ENT:  Negative. ENDOCRINE:  He denies diabetes and hypothyroidism. CARDIOVASCULAR:  As above.  He has had chest discomfort and pressure, particularly after eating.  He does report occasional orthopnea and dyspnea on exertion.  RESPIRATORY:  He denies cough and sputum production. GI:  He has had no nausea or vomiting.  He denies melena and bright red blood per rectum. GU:  He denies dysuria and hematuria. VASCULAR:  He denies claudication and phlebitis. NEUROLOGICAL:  He has occasional dizziness.  He denies any focal weakness or numbness.  He has never had a TIA or stroke. MUSCULOSKELETAL:  He does report some arthralgias and muscle pains. PSYCHIATRIC:  He does have a history of nervousness and said that he had some withdrawal from alcohol and tobacco when he was in the hospital for 3 days for a stent in 2005. HEMATOLOGIC:  Negative.  ALLERGIES:  To Zoloft which caused a rash.  MEDICATIONS: 1. Prilosec 40 mg daily. 2. Aspirin 81 mg daily. 3. Nitroglycerin 0.4 mg p.r.n. 4. He also has some Viagra at home, but has not started taking that     antibiotics on weekends.  PAST MEDICAL HISTORY:  He has a history  of coronary artery disease as mentioned above, status post myocardial infarction and stenting of his right coronary artery.  He has history of gastroesophageal reflux disease.  He reports a history of recent impotence.  FAMILY HISTORY:  His father died of MI at 26 years old.  Mother died of MI at 53 years old.  He has 12 siblings and they have all had myocardial infarctions.  SOCIAL HISTORY:  He is married, lives with his wife.  He smokes about one and half pack of cigarettes per day now, but has smoked 3 packs per day until recently.  He drinks about 8 beers per night every night.  PHYSICAL EXAMINATION:   Vital Signs:  His blood pressure is 123/88, pulse is 84 and regular and respiratory rate 16 and unlabored.  Oxygen saturation on room air is 96%.  General:  He is a well-developed white male in no distress.  HEENT:  Normocephalic and atraumatic.  Pupils are equal and reactive to light and accommodation.  Extraocular muscles are intact.  Oropharynx is clear.  Neck:  Normal carotid pulses bilaterally. There are no bruits.  There is no adenopathy or thyromegaly.  Cardiac: Regular rate and rhythm with normal S1 and S2.  There is no murmur, rub or gallop.  Lungs:  Clear.  Abdomen:  Active bowel sounds.  His abdomen is soft and nontender.  There are no palpable masses or organomegaly. Extremities:  No peripheral edema.  Pedal pulses are palpable bilaterally.  Skin:  Warm and dry.  Neurologic:  Alert and oriented x3. Motor and sensory exams are grossly normal.  IMPRESSION:  The patient has significant three-vessel coronary artery disease with recurrent postprandial chest discomfort that sounds like angina.  I agree that coronary artery bypass graft surgery is probably the best treatment to prevent further ischemia and infarction and improve his quality of life.  I discussed the importance of maximum cardiac risk factor reduction including complete smoking cessation with the patient and his wife.  He said that he understood and wanted to try to quit.  I discussed the operative procedure with them including alternatives, benefits, and risks including, but not limited to bleeding, blood transfusion, infection, stroke, myocardial infarction, graft failure, and death.  He understands and agrees to proceed.  I also discussed the possibility that he may have alcohol or tobacco withdrawal in the hospital and that we would try to prophylax him for that.  He is in agreement with that.  He is scheduled for surgery on Thursday, November 22, 2010.  Evelene Croon, M.D. Electronically Signed  BB/MEDQ  D:   11/20/2010  T:  11/20/2010  Job:  [id number]  cc:   Armanda Magic, M.D.

## 2010-11-21 ENCOUNTER — Ambulatory Visit (HOSPITAL_COMMUNITY)
Admission: RE | Admit: 2010-11-21 | Discharge: 2010-11-21 | Disposition: A | Discharge status: Home / Self Care | Payer: Medicare Other | Source: Ambulatory Visit | Attending: Surgery | Admitting: Surgery

## 2010-11-22 ENCOUNTER — Inpatient Hospital Stay (HOSPITAL_COMMUNITY): Payer: Medicare Other

## 2010-11-22 ENCOUNTER — Inpatient Hospital Stay (HOSPITAL_COMMUNITY)
Admission: RE | Admit: 2010-11-22 | Discharge: 2010-11-27 | DRG: 236 | Disposition: A | Discharge status: Home / Self Care | Payer: Medicare Other | Source: Ambulatory Visit | Attending: Surgery | Admitting: Surgery

## 2010-11-22 DIAGNOSIS — I2510 Atherosclerotic heart disease of native coronary artery without angina pectoris: Secondary | ICD-10-CM

## 2010-11-22 DIAGNOSIS — Z9861 Coronary angioplasty status: Secondary | ICD-10-CM

## 2010-11-22 DIAGNOSIS — D62 Acute posthemorrhagic anemia: Secondary | ICD-10-CM | POA: Diagnosis not present

## 2010-11-22 DIAGNOSIS — F1010 Alcohol abuse, uncomplicated: Secondary | ICD-10-CM | POA: Diagnosis present

## 2010-11-22 DIAGNOSIS — F17200 Nicotine dependence, unspecified, uncomplicated: Secondary | ICD-10-CM | POA: Diagnosis present

## 2010-11-22 DIAGNOSIS — IMO0002 Reserved for concepts with insufficient information to code with codable children: Secondary | ICD-10-CM | POA: Diagnosis not present

## 2010-11-22 DIAGNOSIS — Z7982 Long term (current) use of aspirin: Secondary | ICD-10-CM

## 2010-11-22 DIAGNOSIS — I200 Unstable angina: Secondary | ICD-10-CM | POA: Diagnosis present

## 2010-11-22 DIAGNOSIS — Z01812 Encounter for preprocedural laboratory examination: Secondary | ICD-10-CM

## 2010-11-22 DIAGNOSIS — I252 Old myocardial infarction: Secondary | ICD-10-CM

## 2010-11-22 DIAGNOSIS — K219 Gastro-esophageal reflux disease without esophagitis: Secondary | ICD-10-CM | POA: Diagnosis present

## 2010-11-22 HISTORY — PX: OTHER SURGICAL HISTORY: SHX169

## 2010-11-22 LAB — POCT I-STAT 3, ART BLOOD GAS (G3+)
Acid-base deficit: 2 mmol/L (ref 0.0–2.0)
Bicarbonate: 24.8 mEq/L — ABNORMAL HIGH (ref 20.0–24.0)
Bicarbonate: 24.8 mEq/L — ABNORMAL HIGH (ref 20.0–24.0)
O2 Saturation: 100 %
O2 Saturation: 96 %
O2 Saturation: 94 %
Patient temperature: 35.3
Patient temperature: 37
TCO2: 26 mmol/L (ref 0–100)
TCO2: 26 mmol/L (ref 0–100)
TCO2: 26 mmol/L (ref 0–100)
TCO2: 26 mmol/L (ref 0–100)
pCO2 arterial: 43.4 mmHg (ref 35.0–45.0)
pCO2 arterial: 44.2 mmHg (ref 35.0–45.0)
pH, Arterial: 7.367 (ref 7.350–7.450)
pH, Arterial: 7.294 — ABNORMAL LOW (ref 7.350–7.450)
pH, Arterial: 7.362 (ref 7.350–7.450)
pH, Arterial: 7.367 (ref 7.350–7.450)
pO2, Arterial: 103 mmHg — ABNORMAL HIGH (ref 80.0–100.0)
pO2, Arterial: 77 mmHg — ABNORMAL LOW (ref 80.0–100.0)
pO2, Arterial: 93 mmHg (ref 80.0–100.0)

## 2010-11-22 LAB — POCT I-STAT 4, (NA,K, GLUC, HGB,HCT)
Glucose, Bld: 95 mg/dL (ref 70–99)
Glucose, Bld: 138 mg/dL — ABNORMAL HIGH (ref 70–99)
HCT: 44 % (ref 39.0–52.0)
HCT: 37 % — ABNORMAL LOW (ref 39.0–52.0)
Hemoglobin: 16 g/dL (ref 13.0–17.0)
Hemoglobin: 11.9 g/dL — ABNORMAL LOW (ref 13.0–17.0)
Hemoglobin: 12.6 g/dL — ABNORMAL LOW (ref 13.0–17.0)
Potassium: 4.3 mEq/L (ref 3.5–5.1)
Potassium: 5 mEq/L (ref 3.5–5.1)
Potassium: 5.7 mEq/L — ABNORMAL HIGH (ref 3.5–5.1)
Potassium: 4.1 mEq/L (ref 3.5–5.1)
Sodium: 139 mEq/L (ref 135–145)
Sodium: 139 mEq/L (ref 135–145)
Sodium: 140 mEq/L (ref 135–145)

## 2010-11-22 LAB — POCT I-STAT, CHEM 8
BUN: 5 mg/dL — ABNORMAL LOW (ref 6–23)
Chloride: 106 mEq/L (ref 96–112)
Chloride: 104 mEq/L (ref 96–112)
Creatinine, Ser: 0.8 mg/dL (ref 0.50–1.35)
Glucose, Bld: 119 mg/dL — ABNORMAL HIGH (ref 70–99)
HCT: 35 % — ABNORMAL LOW (ref 39.0–52.0)
Hemoglobin: 11.9 g/dL — ABNORMAL LOW (ref 13.0–17.0)
Potassium: 5.4 mEq/L — ABNORMAL HIGH (ref 3.5–5.1)
Potassium: 4.2 mEq/L (ref 3.5–5.1)
Sodium: 139 mEq/L (ref 135–145)
Sodium: 138 mEq/L (ref 135–145)
TCO2: 23 mmol/L (ref 0–100)

## 2010-11-22 LAB — CREATININE, SERUM
GFR calc Af Amer: >60 mL/min (ref >60)
GFR calc non Af Amer: >60 mL/min (ref >60)

## 2010-11-22 LAB — CBC
HCT: 38.5 % — ABNORMAL LOW (ref 39.0–52.0)
HCT: 37.6 % — ABNORMAL LOW (ref 39.0–52.0)
Hemoglobin: 12.5 g/dL — ABNORMAL LOW (ref 13.0–17.0)
MCH: 32.9 pg (ref 26.0–34.0)
MCV: 98.2 fL (ref 78.0–100.0)
MCV: 98.2 fL (ref 78.0–100.0)
Platelets: 137 10*3/uL — ABNORMAL LOW (ref 150–400)
RDW: 12.9 % (ref 11.5–15.5)
RDW: 13 % (ref 11.5–15.5)
WBC: 13.9 10*3/uL — ABNORMAL HIGH (ref 4.0–10.5)

## 2010-11-22 LAB — PROTIME-INR
INR: 1.61 — ABNORMAL HIGH (ref 0.00–1.49)
Prothrombin Time: 19.4 seconds — ABNORMAL HIGH (ref 11.6–15.2)

## 2010-11-22 LAB — HEMOGLOBIN AND HEMATOCRIT, BLOOD: Hemoglobin: 12 g/dL — ABNORMAL LOW (ref 13.0–17.0)

## 2010-11-22 LAB — GLUCOSE, CAPILLARY

## 2010-11-23 ENCOUNTER — Inpatient Hospital Stay (HOSPITAL_COMMUNITY): Payer: Medicare Other

## 2010-11-23 HISTORY — PX: OTHER SURGICAL HISTORY: SHX169

## 2010-11-23 LAB — CBC
HCT: 28.5 % — ABNORMAL LOW (ref 39.0–52.0)
HCT: 27.3 % — ABNORMAL LOW (ref 39.0–52.0)
MCH: 32.6 pg (ref 26.0–34.0)
MCHC: 33.7 g/dL (ref 30.0–36.0)
MCHC: 33.7 g/dL (ref 30.0–36.0)
MCHC: 34.8 g/dL (ref 30.0–36.0)
MCV: 95.1 fL (ref 78.0–100.0)
Platelets: 94 10*3/uL — ABNORMAL LOW (ref 150–400)
Platelets: 120 10*3/uL — ABNORMAL LOW (ref 150–400)
Platelets: 138 10*3/uL — ABNORMAL LOW (ref 150–400)
RDW: 12.7 % (ref 11.5–15.5)
RDW: 14.3 % (ref 11.5–15.5)
WBC: 10.2 10*3/uL (ref 4.0–10.5)
WBC: 12.9 10*3/uL — ABNORMAL HIGH (ref 4.0–10.5)

## 2010-11-23 LAB — GLUCOSE, CAPILLARY
Glucose-Capillary: 117 mg/dL — ABNORMAL HIGH (ref 70–99)
Glucose-Capillary: 123 mg/dL — ABNORMAL HIGH (ref 70–99)
Glucose-Capillary: 124 mg/dL — ABNORMAL HIGH (ref 70–99)
Glucose-Capillary: 107 mg/dL — ABNORMAL HIGH (ref 70–99)
Glucose-Capillary: 129 mg/dL — ABNORMAL HIGH (ref 70–99)
Glucose-Capillary: 121 mg/dL — ABNORMAL HIGH (ref 70–99)
Glucose-Capillary: 129 mg/dL — ABNORMAL HIGH (ref 70–99)
Glucose-Capillary: 93 mg/dL (ref 70–99)
Glucose-Capillary: 97 mg/dL (ref 70–99)

## 2010-11-23 LAB — POCT I-STAT 4, (NA,K, GLUC, HGB,HCT)
Glucose, Bld: 106 mg/dL — ABNORMAL HIGH (ref 70–99)
HCT: 23 % — ABNORMAL LOW (ref 39.0–52.0)
Hemoglobin: 10.2 g/dL — ABNORMAL LOW (ref 13.0–17.0)
Hemoglobin: 8.5 g/dL — ABNORMAL LOW (ref 13.0–17.0)
Potassium: 4.8 mEq/L (ref 3.5–5.1)
Potassium: 4.9 mEq/L (ref 3.5–5.1)
Sodium: 137 mEq/L (ref 135–145)
Sodium: 139 mEq/L (ref 135–145)

## 2010-11-23 LAB — POCT I-STAT 3, ART BLOOD GAS (G3+)
Acid-base deficit: 3 mmol/L — ABNORMAL HIGH (ref 0.0–2.0)
O2 Saturation: 97 %
Patient temperature: 38.4
TCO2: 26 mmol/L (ref 0–100)
TCO2: 22 mmol/L (ref 0–100)
pH, Arterial: 7.39 (ref 7.350–7.450)
pO2, Arterial: 97 mmHg (ref 80.0–100.0)

## 2010-11-23 LAB — BASIC METABOLIC PANEL
Calcium: 7.3 mg/dL — ABNORMAL LOW (ref 8.4–10.5)
GFR calc non Af Amer: >60 mL/min (ref >60)
Glucose, Bld: 114 mg/dL — ABNORMAL HIGH (ref 70–99)
Sodium: 136 mEq/L (ref 135–145)

## 2010-11-23 LAB — MAGNESIUM: Magnesium: 2.1 mg/dL (ref 1.5–2.5)

## 2010-11-23 LAB — PREPARE PLATELET PHERESIS

## 2010-11-23 LAB — POCT I-STAT, CHEM 8
Calcium, Ion: 1.14 mmol/L (ref 1.12–1.32)
Hemoglobin: 9.5 g/dL — ABNORMAL LOW (ref 13.0–17.0)
Sodium: 138 mEq/L (ref 135–145)
TCO2: 23 mmol/L (ref 0–100)

## 2010-11-23 LAB — CREATININE, SERUM: GFR calc Af Amer: >60 mL/min (ref >60)

## 2010-11-24 ENCOUNTER — Inpatient Hospital Stay (HOSPITAL_COMMUNITY): Payer: Medicare Other

## 2010-11-24 LAB — CBC
HCT: 25.1 % — ABNORMAL LOW (ref 39.0–52.0)
MCH: 32.3 pg (ref 26.0–34.0)
MCHC: 33.9 g/dL (ref 30.0–36.0)
MCV: 95.4 fL (ref 78.0–100.0)
Platelets: 124 10*3/uL — ABNORMAL LOW (ref 150–400)
RDW: 14.7 % (ref 11.5–15.5)

## 2010-11-24 LAB — TYPE AND SCREEN
ABO/RH(D): O NEG
Antibody Screen: NEGATIVE
Unit division: 0

## 2010-11-24 LAB — BASIC METABOLIC PANEL
BUN: 8 mg/dL (ref 6–23)
Calcium: 7.8 mg/dL — ABNORMAL LOW (ref 8.4–10.5)
Creatinine, Ser: 0.69 mg/dL (ref 0.50–1.35)
GFR calc Af Amer: >60 mL/min (ref >60)
GFR calc non Af Amer: >60 mL/min (ref >60)

## 2010-11-24 LAB — GLUCOSE, CAPILLARY: Glucose-Capillary: 108 mg/dL — ABNORMAL HIGH (ref 70–99)

## 2010-11-25 LAB — BASIC METABOLIC PANEL WITH GFR
BUN: 6 mg/dL (ref 6–23)
CO2: 27 meq/L (ref 19–32)
Calcium: 8.2 mg/dL — ABNORMAL LOW (ref 8.4–10.5)
Chloride: 106 meq/L (ref 96–112)
Creatinine, Ser: 0.7 mg/dL (ref 0.50–1.35)
GFR calc Af Amer: >60 mL/min
GFR calc non Af Amer: >60 mL/min
Glucose, Bld: 107 mg/dL — ABNORMAL HIGH (ref 70–99)
Potassium: 3.4 meq/L — ABNORMAL LOW (ref 3.5–5.1)
Sodium: 139 meq/L (ref 135–145)

## 2010-11-25 LAB — CBC
MCH: 31.9 pg (ref 26.0–34.0)
MCHC: 33.3 g/dL (ref 30.0–36.0)
MCV: 95.7 fL (ref 78.0–100.0)
Platelets: 153 10*3/uL (ref 150–400)
RBC: 2.76 MIL/uL — ABNORMAL LOW (ref 4.22–5.81)

## 2010-12-03 NOTE — Op Note (Signed)
NAMEMarland Rosales  DAGOBERTO, [name] NO.:  [id number]  MEDICAL RECORD NO.:  [id number]  LOCATION:  2304                         FACILITY:  [facility]  PHYSICIAN:  Evelene Croon, M.D.     DATE OF BIRTH:  [birth year]  DATE OF PROCEDURE:  11/22/2010 DATE OF DISCHARGE:                              OPERATIVE REPORT   PREOPERATIVE DIAGNOSIS:  Mediastinal bleeding, status post coronary artery bypass graft surgery.  POSTOPERATIVE DIAGNOSIS:  Mediastinal bleeding, status post coronary artery bypass graft surgery.  PROCEDURE:  Exploration of mediastinum, extracorporeal circulation, repair of bleeding vein graft branch.  SURGEON:  Evelene Croon, MD  ASSISTANT:  RNFA.  ANESTHESIA:  General endotracheal.  CLINICAL HISTORY:  This patient is a 50 year old gentleman, who underwent coronary artery bypass graft surgery x4 earlier today.  He had excellent hemostasis initially at the end of surgery, but within 1 hour postoperatively, he started putting out some blood from his mediastinal tubes.  This was about 150 mL an hour for few hours and it appeared to be darker venous-type blood.  This continued to slowly increased to about 200-250 mL an hour and then finally 300 mL an hour when we decided to return him to the operating room.  He remained hemodynamically stable with normal clotting values.  He required no blood products.  I discussed the operative procedure with the patient and his wife.  I discussed the alternatives, benefits, and risks including but not limited to bleeding, blood transfusion, infection, recurrence of bleeding, and he understood and agreed to proceed.  OPERATIVE PROCEDURE:  The patient was taken back to the operating room in hemodynamically stable condition.  He was placed on table in the supine position.  He was placed under general endotracheal anesthesia by Anesthesiology.  Then the neck, chest, and abdomen were prepped with Betadine soap and solution and draped in  usual sterile manner.  The chest incision was opened.  The sternal wires were removed.  On separation of the sternum, there was some bright red blood within the mediastinum.  The sternal retractor was placed.  There was also a wider amount of clot present behind the heart as well as some fresh bright red blood and it quickly became obvious that this was the bleeding site.  I removed all the blood clot and tried to see what was bleeding from the back of the heart and was concerned that might be something at the anastomosis of the left circumflex obtuse marginal vein graft.  I could not adequately expose this area, and therefore, I decided to place the patient on cardiopulmonary bypass since there was continued bright red blood from this area.  The patient was then heparinized when an adequate ACT was obtained.  The distal ascending aorta was cannulated using a 20- French aortic cannula for arterial inflow.  Venous outflow was achieved using a two-stage venous cannula for the right atrial appendage.  The patient was placed on cardiopulmonary bypass.  The lungs were deflated. While on bypass, I was able to lift the heart up and expose this graft. It was apparent that the bleeding was from a small vein graft branch just proximal to the anastomosis, which still had  the tie on it, but looked like it blew out right at the base of the branch.  This was repaired easily with a single 7-0 Prolene figure-of-eight stitch.  The anastomosis itself appeared hemostatic.  The remainder of the vein graft was checked and was hemostatic.  The other distal anastomoses were hemostatic.  There was no further bleeding sites seen.  The ventricular pacing wires were again placed on the anterior surface of the right ventricle.  The patient was AV paced at 90 and weaned from cardiopulmonary bypass on no inotropic agents.  Total bypass time was 17 minutes.  Then, protamine was given and the venous and aortic  cannulas were removed without difficulty.  Cardiac function appeared excellent. The cardiac output was 6 liters per minute.  Hemostasis was achieved without difficulty.  The chest tubes were declotted and placed back in original position with a tube in the posterior pericardium, one in the anterior mediastinum and one in the left pleural space.  Then, the sternum was closed with double #6 stainless steel wires.  The fascia was closed with continuous #1 Vicryl suture.  Subcutaneous tissue was closed with continuous 2-0 Vicryl and the skin with 3-0 Vicryl subcuticular closure.  The sponge, needle, and instrument counts were correct according to the scrub nurse.  Dry sterile dressing applied over the incisions around the chest tubes and Pleur-Evac suction.  The patient remained hemodynamically stable and was transported to the SICU in guarded, but stable condition.     Evelene Croon, M.D.     BB/MEDQ  D:  11/22/2010  T:  11/23/2010  Job:  [id number]  Electronically Signed by Evelene Croon M.D. on 12/03/2010 03:59:10 PM

## 2010-12-03 NOTE — Op Note (Signed)
NAMEMarland Kitchen  Noah Rosales, Noah Rosales NO.:  [id number]  MEDICAL RECORD NO.:  [id number]  LOCATION:  2304                         FACILITY:  [facility]  PHYSICIAN:  Evelene Croon, M.D.     DATE OF BIRTH:  1961-03-30  DATE OF PROCEDURE: DATE OF DISCHARGE:                              OPERATIVE REPORT   PREOPERATIVE DIAGNOSIS:  Severe three-vessel coronary artery disease with unstable angina.  POSTOPERATIVE DIAGNOSIS:  Severe three-vessel coronary artery disease with unstable angina.  OPERATIVE PROCEDURE:  Median sternotomy, extracorporeal circulation, coronary artery bypass graft surgery x4 using a left internal mammary artery graft to the left anterior descending coronary artery, with a saphenous vein graft to the obtuse marginal branch of the left circumflex coronary artery, and a sequential saphenous vein graft to posterior descending and posterolateral branches of the right coronary artery.  Endoscopic vein harvesting from the right leg.  ATTENDING SURGEON:  Evelene Croon, MD  ASSISTANT:  Coral Ceo, PA-C  ANESTHESIA:  General endotracheal.  CLINICAL HISTORY:  This patient is a 50 year old gentleman with a history of heavy smoking, alcohol abuse as well as history of coronary artery disease status post myocardial infarction and stenting of his right coronary artery in 2005.  He reports having recurrent episodes of chest discomfort over the past 8 years somewhat vague.  He says the episodes typically occurred after eating meal and began in the epigastrium and spread in the chest and then to both shoulders.  He was apparently seen in the hospital several weeks ago with chest discomfort and had an elevated CPK of 478 with MB of 8.4 and a normal troponin.  He refused admission and further workup.  He did have an outpatient nuclear stress test which showed very small area of decreased blood flow but I do not have the official results.  He underwent cardiac catheterization on  November 13, 2010, that showed significant three-vessel disease.  The LAD had moderately long 50-70% stenosis just distal to the first diagonal branch.  The vessel was heavily calcified in this area.  There were left- to-right collaterals feeding the occluded right coronary artery.  The right coronary artery had a patent stent in the proximal and midportion. Just beyond the stent, there was about 70-80% stenosis and the right coronary artery was occluded beyond that.  There was faint filling of the distal vessel by collaterals from the left.  I did review his prior catheterizations in 2005, which showed that the right coronary artery was a fairly large vessel with a moderate size posterior descending and posterolateral branch.  These had mild disease in 2005.  Left circumflex had a 70-80% midvessel stenosis and a 70-80% distal stenosis.  There was really only one large obtuse marginal branch.  Left ventricular ejection fraction was about 60%.  After review of the catheterization and examination of the patient, it was felt that coronary artery bypass graft surgery is the best treatment to prevent further ischemia, infarction and improve his quality of life.  I discussed the operative procedure with the patient and his wife including alternatives, benefits, and risks including but not limited to bleeding, blood transfusion, infection, stroke, myocardial infarction, graft failure, and death.  I also discussed the importance of maximum cardiac risk factor reduction including complete smoking cessation.  He said they had understood and agreed to proceed.  OPERATIVE PROCEDURE:  The patient was taken to the operating room and placed on the table in supine position.  After induction of general endotracheal anesthesia, a Foley catheter was placed in the bladder using sterile technique.  Then the chest, abdomen, both lower extremities were prepped and draped in usual sterile manner.  The chest was  entered through a median sternotomy incision and the pericardium opened in the midline.  Examination of the heart showed good ventricular contractility.  The ascending aorta had no palpable plaques in it.  Then, the left internal mammary artery was flushed from the chest wall as a pedicle graft.  This was a medium caliber vessel with excellent blood flow through it.  At the same time, a segment of greater saphenous vein was harvested from the right leg using endoscopic vein harvest technique.  This vein was of medium size and good quality.  Then, the patient was heparinized and when an adequate ACT was obtained, the distal ascending aorta was cannulated using a 20-French aortic cannula for arterial inflow.  Venous outflow was achieved using a two- stage venous cannula for the right atrial appendage.  Antegrade cardioplegia and vent cannula was inserted into the aortic root.  The patient was placed on cardiopulmonary bypass and distal coronaries identified.  The LAD was a large vessel that had minimal disease in the distal portion.  The obtuse marginal was also a large vessel and had some mild segmental plaque distally.  The right coronary artery did give off moderate-sized posterior descending and posterolateral branches. Both of these had some segmental plaque within them but were suitable for grafting.  Then, the aorta was crossclamped and 800 mL of cold blood antegrade cardioplegia was administered in the aortic root with quick arrest of the heart.  Systemic hypothermia to 20 degrees centigrade and topical hypothermic iced saline was used.  A temperature probe was placed in the septum insulating the pad in the pericardium.  The first distal anastomosis was performed in the obtuse marginal branch.  The internal diameter was 1.75 mm.  The conduit used was a segment of greater saphenous vein anastomosed for in an end-to-side manner using continuous 7-0 Prolene suture.  Flow was noted  through the graft and was excellent.  Second distal anastomosis was performed to the posterior descending coronary artery.  The internal diameter was about 1.5 mm.  The conduit used was a second segment of greater saphenous vein and the anastomosis performed in a sequential side-to-side manner using continuous 7-0 Prolene suture.  Flow was noted through the graft and was good.  The third distal anastomosis was performed in the posterolateral branch. The internal diameter was also about 1.5 mm.  Conduit used was the same segment of greater saphenous vein and the anastomosis performed in an end-to-side manner using continuous 7-0 Prolene suture.  Flow was noted through the graft and was excellent.  Then, another dose of cardioplegia was given down the vein grafts in the aortic root.  The fourth distal anastomosis was performed to the distal LAD.  The internal diameter of this vessel was about 2 mm.  Conduit used was a left internal mammary graft and this was brought through an opening in the left pericardium anterior to the phrenic nerve.  It was anastomosed to the LAD in an end-to-side manner using continuous 8-0 Prolene suture. The  pedicle was sutured to the epicardium with 6-0 Prolene sutures.  The patient was then rewarmed to 37 degrees centigrade.  Another dose of cardioplegia was given and the two proximal vein graft anastomoses were performed in the mid ascending aorta in an end-to-side manner using continuous 6-0 Prolene suture.  Then, the clamp was removed from mammary pedicle.  There was rapid warming of the ventricular septum and return of spontaneous ventricular fibrillation.  Crossclamp was removed, the time was 63 minutes and the patient spontaneously converted to sinus rhythm.  The proximal and distal anastomoses appeared static while the graft satisfactory.  Graft markers were placed around the proximal anastomosis.  Two temporary right ventricular and right atrial  pacing wires were placed above through the skin.  When the patient rewarmed to 37 degrees centigrade, he was weaned from cardiopulmonary bypass on no inotropic agents.  Total bypass time was 80 minutes.  Cardiac function appeared good with cardiac output of 5 liters per minute.  The protamine was given and venous and aortic cannulae were removed without difficulty.  Hemostasis was achieved.  Three chest tubes were placed with 2 in the posterior pericardium, 1 in left pleural space and 1 in the anterior mediastinum.  The sternum was then closed with double #6 stainless steel wires.  Fascia was closed with continuous #1 Vicryl suture.  Subcutaneous tissue was closed with continuous 2-0 Vicryl and the skin with 3-0 Vicryl subcuticular closure.  The lower extremity vein harvest site was closed in layers in a similar manner. The sponge, needle and instrument counts were correct according to scrub nurse.  Dry sterile dressings were applied over the incisions, around the chest tubes which were Pleur-Evac suctioned.  The patient remained hemodynamically stable, was transported to the SICU in guarded and stable condition.     Evelene Croon, M.D.     BB/MEDQ  D:  11/22/2010  T:  11/22/2010  Job:  [id number]  cc:   Armanda Magic, M.D.  Electronically Signed by Evelene Croon M.D. on 12/03/2010 03:59:06 PM

## 2010-12-03 NOTE — Discharge Summary (Signed)
NAMEMarland Rosales  MASYN, ROSTRO NO.:  [id number]  MEDICAL RECORD NO.:  [id number]  LOCATION:  2004                         FACILITY:  [facility]  PHYSICIAN:  Evelene Croon, M.D.     DATE OF BIRTH:  [birth year]  DATE OF ADMISSION:  11/22/2010 DATE OF DISCHARGE:  11/27/2010                              DISCHARGE SUMMARY   ADMITTING DIAGNOSES: 1. History of coronary artery disease (status post myocardial     infarction, percutaneous coronary intervention with stent to the     right coronary artery in 2005). 2. History of gastroesophageal reflux disease. 3. History of tobacco abuse. 4. History of alcohol abuse.  DISCHARGE DIAGNOSES: 1. History of coronary artery disease (status post myocardial     infarction, percutaneous coronary intervention with stent to the     right coronary artery in 2005). 2. History of gastroesophageal reflux disease. 3. History of tobacco abuse. 4. History of alcohol abuse. 5. Acute blood loss anemia.  PROCEDURES: 1. CABG x4 (LIMA to LAD, SVG to obtuse marginal, SVG sequentially to     posterior descending coronary artery and posterolateral branches of     the RCA by Dr. Laneta Simmers on November 22, 2010). 2. Exploration of mediastinum, extracorporeal circulation repair of     bleeding vein graft branch on November 22, 2010.  HISTORY OF PRESENT ILLNESS:  This is a 50 year old Caucasian male, with the aforementioned past medical history, who according to medical records was having recurrent episodes of chest discomfort for approximately the past 8 years.  As previously stated, he is status post MI and PCI to the RCA in 2005.  He was initially seen in consultation in the office by Dr. Laneta Simmers on November 20, 2010 for the consideration of coronary artery bypass grafting surgery.  According to the patient, this chest discomfort typically occurred after eating meals.  It begins in his epigastrium and spreads into his chest and into both shoulders. Apparently, he was  seen in the hospital several weeks prior to this admission for chest discomfort.  At that time, he was found to have a CPK of 478 with an MB of 8.4 and normal troponin.  Apparently, he  refused admission.  He then had an outpatient nuclear stress test which showed a very small area of decreased blood flow (although the actual results are currently not available).  He then underwent a cardiac catheterization on November 13, 2010 by Dr. Armanda Magic.  Results showed an LAD of 50%-70% moderately long stenosis just distal to first diagonal branch, just beyond the stent, there was a 70%-80% stenosis of the right coronary artery, which was then occluded beyond that, the left circumflex had a 70%-80% midvessel stenosis as well as distal stenosis, and the left ventricular ejection fraction was about 60%.  Potential risks, complications, and benefits of the surgery were discussed with the patient.  He agreed to proceed with surgery.  He was admitted to [facility] on November 22, 2010 in order to undergo CABG x4 by Dr. Laneta Simmers.  BRIEF HOSPITAL COURSE STAY:  The patient was extubated without difficulty early the evening of surgery.  He did have to undergo exploration of the mediastinum and repair  of bleeding vein graft branch later that evening.  Afterwards, he remained atrial paced initially and was not started on beta-blocker until postoperative day #4 secondary to labile blood pressure.  He was also initially on neo and insulin drips. These were able to be weaned off.  The Swan-Ganz, A-line, chest tube, and Foley were all removed early in his postoperative course.  He was started on a nicotine patch (the patient has a history of tobacco abuse and he was also started on a multivitamin, thiamine, Ativan, and given two beers daily for his history of alcohol abuse).  He continued to make steady progress.  He was felt surgically stable for transfer from the intensive care unit to [facility] for further  convalescence on November 25, 2010.  Currently, on postop day #4, he had T-max 99.6, later became afebrile, heart rate is in the 100, BP 119/75, O2 sat 92% on room air. He does have complaints of constipation and tenderness of the right lower extremity.  He will be given a laxative choice for his constipation.  He has some tenderness of his right lower extremity, but there are no obvious signs of infection.  He has already been tolerating a diet.  PHYSICAL EXAMINATION:  HEART:  He is slight tachycardic. PULMONARY:  Clear. ABDOMEN:  Soft, nontender.  Bowel sounds present. EXTREMITIES:  Trace lower extremity, right greater than left. SKIN:  Sternal and right lower extremity wounds are clean, dry, and continuing to heal.  Provided, he remains afebrile, hemodynamically stable, and pending morning round evaluation, and surgically stable for discharge on November 27, 2010.  Epicardial pacing wires will be removed today and chest tube sutures will be removed in the morning prior to his discharge.  Please note previous to his stay, he was surgically stable for transfer from intensive care unit to [facility] for further convalescence.  It should be noted that the patient was found to have acute blood loss anemia postoperatively.  His H and H went as low as 8.5 and 25.1.  He did not require postoperative transfusion.  He was started on ferrous gluconate twice daily.  LATEST LABORATORY STUDIES:  BMET done on August 12, potassium 3.4, which has been supplemented, sodium 139, BUN and creatinine 6 and 0.7 respectively.  CBC on this date, H and H 8.8 and 26.4, white blood cell count 1600, platelet count 153,000.  Last chest x-ray was done on November 24, 2010, which showed no pneumothorax, low lung volumes with perihilar and bibasilar atelectasis, small right pleural effusions.  DISCHARGE INSTRUCTIONS:  Include the following:  Diet:  Low-sodium heart-healthy diabetic diet.  Activity:  The patient may  walk up steps.  He may shower.  He is not to lift more than 10 pounds for 4 weeks and not to drive until after 4 weeks.  He is to continue with his breathing exercise daily.  He is to walk every day and increase his frequency and duration as tolerates.  Wound Care:  He is to use soap and water on his wounds.  He is to contact the office if any wound problems arise.  FOLLOWUP APPOINTMENTS: 1. The patient is to contact Dr. Norris Cross office for follow-up     appointment in 2 weeks. 2. The patient has an appointment to see Dr. Laneta Simmers on December 18, 2010 at 11:30 a.m., however, the patient is to be there at 11 a.m.     and he needs to have his chest x-ray taken at 10:15  a.m.  He also     needs to call for follow-up appointment with his medical doctor     regarding hemoglobin A1c preop of 6.0. 3.  The patient needs to make an appointment to see a medical doctor     regarding further followup of a HgA1C 6. 4.  The patient was instructed to stop smoking and he is currently     on the Nicoderm CQ patch.  He was given a number to assist him     with smoking cessation.  DISCHARGE MEDICATIONS:  At the time of this dictation include the following: 1. Ferrous gluconate 325 mg p.o. two times daily. 2. Folic acid 1 mg p.o. daily.3. Lasix 40 mg p.o. daily x4 days. 4. Potassium chloride 20 mEq p.o. daily x4 days. 5. Lopressor 25 mg one half tablet p.o. two times daily. 6. Multivitamin p.o. daily. 7. Nicotine patch 21 mg/24-hour patch transdermally daily. 8. Oxycodone 5 mg one-two tablets p.o. q.4-6 hours p.r.n. pain. 9. Enteric-coated aspirin 325 mg p.o. daily.  Please note that the patient was not discharged home on statin secondary to history of alcohol abuse and elevated LFTs upon admission.  He was also not discharged on an ACE inhibitor as his blood pressure was previously labile and he has a preserved EF. He has been just recently started on a low-dose beta-blocker .     Doree Fudge, PA   ______________________________ Evelene Croon, M.D.    DZ/MEDQ  D:  11/26/2010  T:  11/26/2010  Job:  [id number]  cc:   Armanda Magic, M.D.  Electronically Signed by Doree Fudge PA on 11/27/2010 09:04:33 AM Electronically Signed by Evelene Croon M.D. on 12/03/2010 03:59:14 PM

## 2010-12-07 ENCOUNTER — Telehealth: Payer: Self-pay | Admitting: *Deleted

## 2010-12-07 NOTE — Telephone Encounter (Signed)
Gen Lortab 7.5/500 one or two tabs every 3 to 6 hrs. Prn pain called to his pharmacy per spouse request. Lynnae January, RN

## 2010-12-12 ENCOUNTER — Other Ambulatory Visit: Payer: Self-pay | Admitting: Surgery

## 2010-12-12 DIAGNOSIS — I2510 Atherosclerotic heart disease of native coronary artery without angina pectoris: Secondary | ICD-10-CM

## 2010-12-14 DIAGNOSIS — F1010 Alcohol abuse, uncomplicated: Secondary | ICD-10-CM | POA: Insufficient documentation

## 2010-12-14 DIAGNOSIS — I2510 Atherosclerotic heart disease of native coronary artery without angina pectoris: Secondary | ICD-10-CM | POA: Insufficient documentation

## 2010-12-14 DIAGNOSIS — I219 Acute myocardial infarction, unspecified: Secondary | ICD-10-CM | POA: Insufficient documentation

## 2010-12-14 DIAGNOSIS — K219 Gastro-esophageal reflux disease without esophagitis: Secondary | ICD-10-CM | POA: Insufficient documentation

## 2010-12-18 ENCOUNTER — Ambulatory Visit: Payer: Medicare Other | Admitting: Surgery

## 2010-12-18 ENCOUNTER — Ambulatory Visit: Payer: Self-pay | Admitting: Surgery

## 2010-12-18 ENCOUNTER — Ambulatory Visit
Admission: RE | Admit: 2010-12-18 | Discharge: 2010-12-18 | Disposition: A | Discharge status: Home / Self Care | Payer: Medicare Other | Source: Ambulatory Visit | Attending: Surgery | Admitting: Surgery

## 2010-12-18 DIAGNOSIS — I2510 Atherosclerotic heart disease of native coronary artery without angina pectoris: Secondary | ICD-10-CM

## 2010-12-21 ENCOUNTER — Ambulatory Visit: Payer: Medicare Other | Admitting: Surgery

## 2011-01-01 ENCOUNTER — Ambulatory Visit (INDEPENDENT_AMBULATORY_CARE_PROVIDER_SITE_OTHER): Payer: Self-pay | Admitting: Surgery

## 2011-01-01 ENCOUNTER — Encounter: Payer: Self-pay | Admitting: Surgery

## 2011-01-01 VITALS — BP 140/90 | HR 96 | Resp 18 | Ht 68.0 in | Wt 197.0 lb

## 2011-01-01 DIAGNOSIS — Z951 Presence of aortocoronary bypass graft: Secondary | ICD-10-CM

## 2011-01-01 DIAGNOSIS — I2510 Atherosclerotic heart disease of native coronary artery without angina pectoris: Secondary | ICD-10-CM

## 2011-01-01 NOTE — Progress Notes (Signed)
  HPI: Patient returns for routine postoperative follow-up having undergone coronary bypass graft surgery x4 on  11/22/2010. The patient's early postoperative recovery while in the hospital was notable for postoperative mediastinal bleeding requiring exploration of the mediastinum with repair of a bleeding vein graft branch. He had a history of heavy alcohol and tobacco abuse and was treated with prophylactic Ativan, beer, multivitamins, and thiamine. Since hospital discharge the patient reports he has been feeling well overall. He is walking daily without chest pain or shortness of breath. He reports that he has been drinking about 6 beers per day and has smoked a few cigarettes per day since discharge.   Current Outpatient Prescriptions  Medication Sig Dispense Refill  . aspirin 325 MG tablet Take 325 mg by mouth daily.        . ferrous gluconate (FERGON) 325 MG tablet Take 325 mg by mouth daily with breakfast.        . folic acid (FOLVITE) 1 MG tablet Take 1 mg by mouth daily.        . metoprolol tartrate (LOPRESSOR) 25 MG tablet Take 25 mg by mouth 2 (two) times daily. 1/2 tablet bid       . Multiple Vitamin (MULTIVITAMIN PO) Take 1 tablet by mouth 1 day or 1 dose.        . nicotine (NICODERM CQ - DOSED IN MG/24 HOURS) 21 mg/24hr patch Place 1 patch onto the skin daily.        Marland Kitchen oxycodone (OXY-IR) 5 MG capsule Take 5 mg by mouth every 4 (four) hours as needed.        . potassium chloride (KLOR-CON) 20 MEQ packet Take 20 mEq by mouth 2 (two) times daily.          Physical Exam: BP 140/90  Pulse 96  Resp 18  Ht 5\' 8"  (1.727 m)  Wt 197 lb (89.359 kg)  BMI 29.95 kg/m2  SpO2 96% He looks well. Cardiac exam shows a regular rate and rhythm with normal heart sounds. Lung exam is clear. The chest incision is healing well and the sternum is stable. His leg incisions healing well and there is no peripheral edema.  Diagnostic Tests: Chest x-ray shows clear lung fields and no pleural  effusions.  Impression: Overall Mr. Kemler is making a good recovery following  surgery. I encouraged him to completely abstain from smoking. I discussed the importance of maximum cardiac risk factor reduction with him. I told him he could return to driving a car but should refrain from lifting anything heavy than 10 pounds for a total of 3 months from the date of surgery.  Plan: He is going to make a followup appointment with Dr. Armanda Magic and will contact me if he developed any problems with his incisions.

## 2011-01-01 NOTE — Patient Instructions (Signed)
You may return to driving when you feel comfortable with that.  Do not lift anything heavier than 10 lbs for three months postoperatively. Return to see me if any problems develop with you incision; such as redness, swelling, or drainage.  

## 2011-01-21 ENCOUNTER — Encounter: Payer: Self-pay | Admitting: *Deleted

## 2011-01-31 ENCOUNTER — Other Ambulatory Visit: Payer: Self-pay

## 2011-01-31 NOTE — Telephone Encounter (Signed)
Requesting refill on pain medication currently taking Vicodin 7.5/500 mg po every 4-6 hours prn #40, no refill. Rx called to pharmacy.

## 2011-02-11 ENCOUNTER — Encounter: Payer: Self-pay | Admitting: *Deleted

## 2013-03-10 ENCOUNTER — Other Ambulatory Visit: Payer: Self-pay | Admitting: Cardiology

## 2013-04-27 IMAGING — CR DG CHEST 1V PORT
1 series · 1 of 1 positions shown · non-contrast
Comparison: 06/10/2010

CLINICAL DATA: Chest pain, COPD, emphysema.

PORTABLE CHEST - 1 VIEW

[view not recorded]
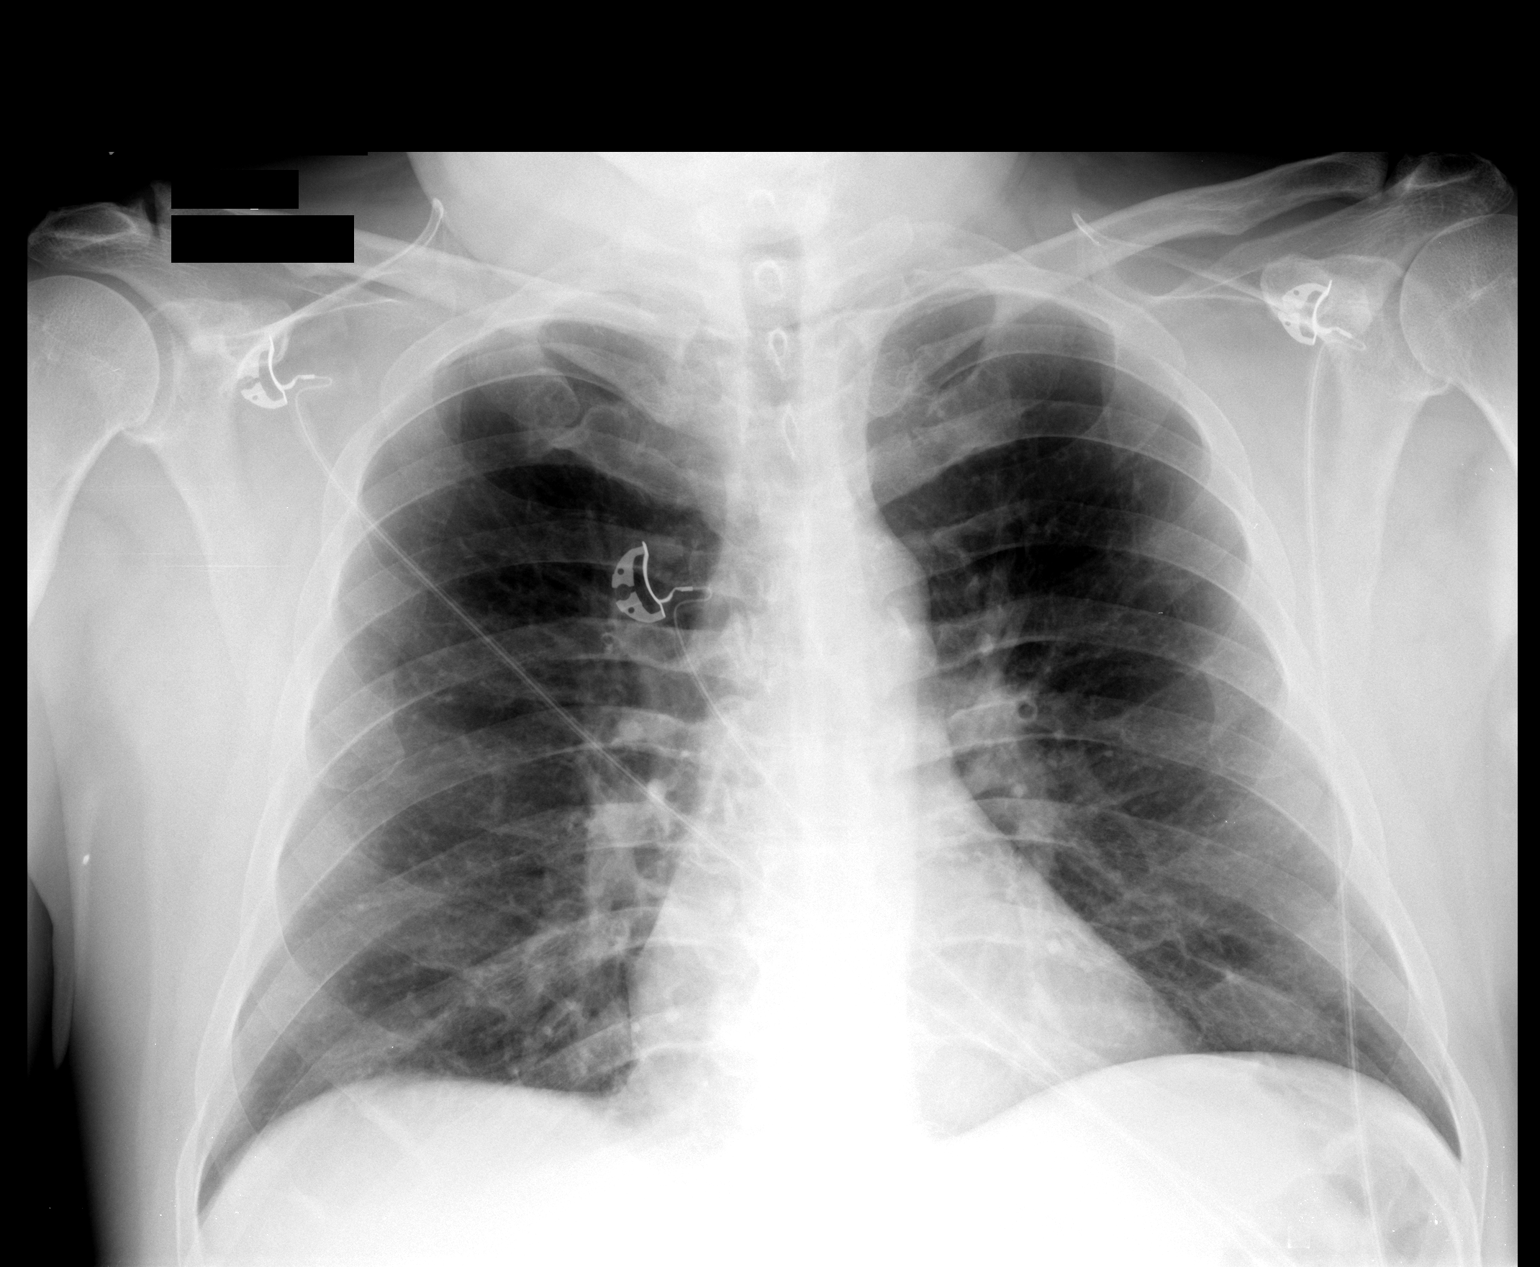

[1 of 1 positions shown; findings below may reference images not displayed]

FINDINGS: Mild peribronchial thickening, stable.  Heart is normal
size.  No confluent opacities or effusions.  No acute bony
abnormality.
IMPRESSION: Mild bronchitic changes.

## 2013-06-10 IMAGING — CR DG CHEST 1V PORT
1 series · 1 of 1 positions shown · non-contrast
Comparison: 11/20/2010.

CLINICAL DATA: CABG.  Postoperative radiograph.

PORTABLE CHEST - 1 VIEW

[AP]
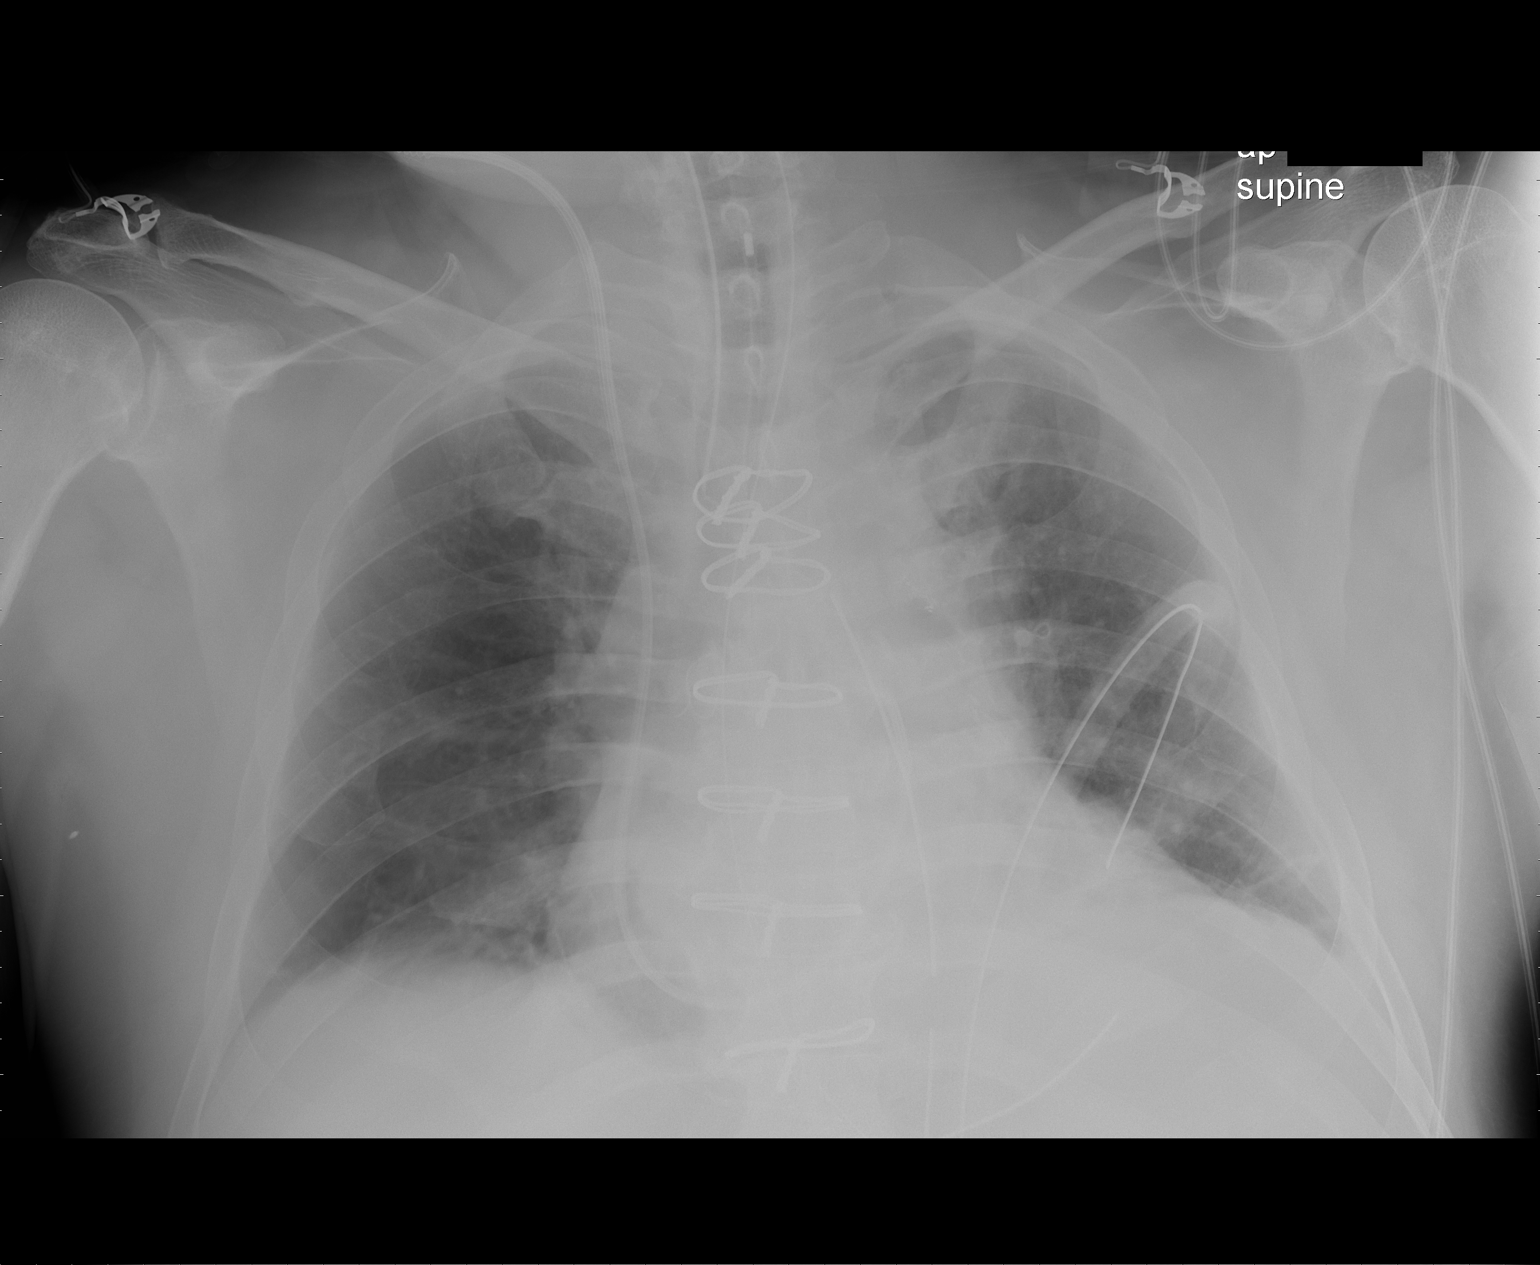

[1 of 1 positions shown; findings below may reference images not displayed]

FINDINGS: Low lung volumes are present.  Endotracheal tube is 31 mm
from the carina.  Mediastinal drain and chest tube noted.

Right IJ vascular sheath transmits Nazarudeen Darwish pulmonary catheter
which is in the pulmonary outflow tract.  Nasogastric tube present
with the tip not visualized.

Low volumes with atelectasis.  No pneumothorax.
IMPRESSION: 1.  Satisfactory appearance of support apparatus.  Endotracheal
tube 31 mm from the carina.
2.  Low volume chest with bibasilar atelectasis status post CABG.

## 2021-11-07 ENCOUNTER — Emergency Department (HOSPITAL_COMMUNITY): Payer: Medicare HMO

## 2021-11-07 ENCOUNTER — Inpatient Hospital Stay (HOSPITAL_COMMUNITY)
Admission: EM | Admit: 2021-11-07 | Discharge: 2021-11-10 | DRG: 690 | Disposition: A | Discharge status: Home / Self Care | Payer: Medicare HMO | Attending: Internal Medicine | Admitting: Internal Medicine

## 2021-11-07 ENCOUNTER — Encounter (HOSPITAL_COMMUNITY): Payer: Self-pay | Admitting: Emergency Medicine

## 2021-11-07 DIAGNOSIS — R338 Other retention of urine: Secondary | ICD-10-CM | POA: Diagnosis not present

## 2021-11-07 DIAGNOSIS — N3001 Acute cystitis with hematuria: Secondary | ICD-10-CM

## 2021-11-07 DIAGNOSIS — R319 Hematuria, unspecified: Secondary | ICD-10-CM | POA: Diagnosis not present

## 2021-11-07 DIAGNOSIS — N329 Bladder disorder, unspecified: Secondary | ICD-10-CM | POA: Diagnosis present

## 2021-11-07 DIAGNOSIS — E669 Obesity, unspecified: Secondary | ICD-10-CM | POA: Diagnosis present

## 2021-11-07 DIAGNOSIS — E860 Dehydration: Secondary | ICD-10-CM | POA: Diagnosis present

## 2021-11-07 DIAGNOSIS — R339 Retention of urine, unspecified: Secondary | ICD-10-CM | POA: Diagnosis not present

## 2021-11-07 DIAGNOSIS — E861 Hypovolemia: Secondary | ICD-10-CM | POA: Diagnosis present

## 2021-11-07 DIAGNOSIS — N3289 Other specified disorders of bladder: Secondary | ICD-10-CM

## 2021-11-07 DIAGNOSIS — N134 Hydroureter: Secondary | ICD-10-CM | POA: Diagnosis not present

## 2021-11-07 DIAGNOSIS — Z87442 Personal history of urinary calculi: Secondary | ICD-10-CM | POA: Diagnosis not present

## 2021-11-07 DIAGNOSIS — R310 Gross hematuria: Secondary | ICD-10-CM | POA: Diagnosis not present

## 2021-11-07 DIAGNOSIS — E785 Hyperlipidemia, unspecified: Secondary | ICD-10-CM | POA: Diagnosis present

## 2021-11-07 DIAGNOSIS — I252 Old myocardial infarction: Secondary | ICD-10-CM | POA: Diagnosis not present

## 2021-11-07 DIAGNOSIS — Z6831 Body mass index (BMI) 31.0-31.9, adult: Secondary | ICD-10-CM

## 2021-11-07 DIAGNOSIS — N136 Pyonephrosis: Secondary | ICD-10-CM | POA: Diagnosis not present

## 2021-11-07 DIAGNOSIS — E871 Hypo-osmolality and hyponatremia: Secondary | ICD-10-CM

## 2021-11-07 DIAGNOSIS — N1330 Unspecified hydronephrosis: Secondary | ICD-10-CM | POA: Diagnosis not present

## 2021-11-07 DIAGNOSIS — Z951 Presence of aortocoronary bypass graft: Secondary | ICD-10-CM | POA: Diagnosis not present

## 2021-11-07 DIAGNOSIS — F1010 Alcohol abuse, uncomplicated: Secondary | ICD-10-CM | POA: Diagnosis not present

## 2021-11-07 DIAGNOSIS — Z7982 Long term (current) use of aspirin: Secondary | ICD-10-CM

## 2021-11-07 DIAGNOSIS — K219 Gastro-esophageal reflux disease without esophagitis: Secondary | ICD-10-CM | POA: Diagnosis not present

## 2021-11-07 DIAGNOSIS — Z888 Allergy status to other drugs, medicaments and biological substances status: Secondary | ICD-10-CM

## 2021-11-07 DIAGNOSIS — F17210 Nicotine dependence, cigarettes, uncomplicated: Secondary | ICD-10-CM | POA: Diagnosis present

## 2021-11-07 DIAGNOSIS — Z91199 Patient's noncompliance with other medical treatment and regimen due to unspecified reason: Secondary | ICD-10-CM

## 2021-11-07 DIAGNOSIS — I2510 Atherosclerotic heart disease of native coronary artery without angina pectoris: Secondary | ICD-10-CM | POA: Diagnosis not present

## 2021-11-07 DIAGNOSIS — R079 Chest pain, unspecified: Secondary | ICD-10-CM | POA: Diagnosis not present

## 2021-11-07 DIAGNOSIS — I700 Atherosclerosis of aorta: Secondary | ICD-10-CM | POA: Diagnosis not present

## 2021-11-07 DIAGNOSIS — R9431 Abnormal electrocardiogram [ECG] [EKG]: Secondary | ICD-10-CM | POA: Diagnosis not present

## 2021-11-07 LAB — URINALYSIS, ROUTINE W REFLEX MICROSCOPIC: Protein, ur: NEGATIVE mg/dL

## 2021-11-07 LAB — COMPREHENSIVE METABOLIC PANEL
ALT: 40 U/L (ref 0–44)
AST: 48 U/L — ABNORMAL HIGH (ref 15–41)
Albumin: 3.6 g/dL (ref 3.5–5.0)
Alkaline Phosphatase: 87 U/L (ref 38–126)
Anion gap: 11 (ref 5–15)
BUN: <5 mg/dL — ABNORMAL LOW (ref 8–23)
CO2: 22 mmol/L (ref 22–32)
Calcium: 8.6 mg/dL — ABNORMAL LOW (ref 8.9–10.3)
Chloride: 93 mmol/L — ABNORMAL LOW (ref 98–111)
Creatinine, Ser: 0.7 mg/dL (ref 0.61–1.24)
GFR, Estimated: >60 mL/min (ref >60)
Glucose, Bld: 106 mg/dL — ABNORMAL HIGH (ref 70–99)
Potassium: 4.2 mmol/L (ref 3.5–5.1)
Sodium: 126 mmol/L — ABNORMAL LOW (ref 135–145)
Total Bilirubin: 1.2 mg/dL (ref 0.3–1.2)
Total Protein: 6.9 g/dL (ref 6.5–8.1)

## 2021-11-07 LAB — URINALYSIS, MICROSCOPIC (REFLEX)
RBC / HPF: >50 RBC/hpf (ref 0–5)
Squamous Epithelial / HPF: NONE SEEN (ref 0–5)

## 2021-11-07 LAB — CBC
HCT: 43.2 % (ref 39.0–52.0)
Hemoglobin: 15.3 g/dL (ref 13.0–17.0)
MCH: 36.6 pg — ABNORMAL HIGH (ref 26.0–34.0)
MCHC: 35.4 g/dL (ref 30.0–36.0)
MCV: 103.3 fL — ABNORMAL HIGH (ref 80.0–100.0)
Platelets: 205 10*3/uL (ref 150–400)
RBC: 4.18 MIL/uL — ABNORMAL LOW (ref 4.22–5.81)
RDW: 12 % (ref 11.5–15.5)
WBC: 8.7 10*3/uL (ref 4.0–10.5)
nRBC: 0 % (ref 0.0–0.2)

## 2021-11-07 MED ORDER — MORPHINE SULFATE (PF) 4 MG/ML IV SOLN
4.0000 mg | Freq: Once | INTRAVENOUS | Status: AC
  Administered 2021-11-07: 4 mg via INTRAVENOUS
  Filled 2021-11-07: qty 1

## 2021-11-07 MED ORDER — CEFTRIAXONE SODIUM 2 G IJ SOLR
2.0000 g | INTRAMUSCULAR | Status: DC
  Administered 2021-11-08 – 2021-11-10 (×3): 2 g via INTRAVENOUS
  Filled 2021-11-07 (×3): qty 20

## 2021-11-07 MED ORDER — SODIUM CHLORIDE 0.9 % IR SOLN
3000.0000 mL | Status: DC
  Administered 2021-11-07: 3000 mL

## 2021-11-07 MED ORDER — SODIUM CHLORIDE 0.9 % IV BOLUS: 500.0000 mL | Freq: Once | INTRAVENOUS | Status: DC

## 2021-11-07 MED ORDER — IOHEXOL 300 MG/ML  SOLN
100.0000 mL | Freq: Once | INTRAMUSCULAR | Status: AC | PRN
  Administered 2021-11-07: 100 mL via INTRAVENOUS

## 2021-11-07 MED ORDER — ONDANSETRON HCL 4 MG/2ML IJ SOLN
4.0000 mg | Freq: Once | INTRAMUSCULAR | Status: AC
Start: 2021-11-07 — End: 2021-11-07
  Administered 2021-11-07: 4 mg via INTRAVENOUS
  Filled 2021-11-07: qty 2

## 2021-11-07 MED ORDER — ALUM & MAG HYDROXIDE-SIMETH 200-200-20 MG/5ML PO SUSP
30.0000 mL | Freq: Once | ORAL | Status: AC
  Administered 2021-11-07: 30 mL via ORAL
  Filled 2021-11-07: qty 30

## 2021-11-07 NOTE — Consult Note (Signed)
Urology Consult  Referring physician:C Margo Aye Reason for referral: Gross hematuria  Chief Complaint: Gross hematuria  History of Present Illness: gross hematuria for 3 days and similar one month ago; had stone one year ago; void q 2 hours and nx1; flow good; smoking history; asa 81 mg  Hb 15.3; Cr normal; urine c/s pending;  CT scan: mild right hydro no stone; 1.7 cm protein cyst right kidney; filling defect posterior bladder irregular and possible source of hydro; full bladder;  PVR 1 liter; foley inserted and irrigated and CBI  Feels better with foley     Past Medical History:  Diagnosis Date   Alcohol abuse    CAD (coronary artery disease)    GERD (gastroesophageal reflux disease)    Myocardial infarction (HCC) 2005   s/p stent to right coronary artery   Past Surgical History:  Procedure Laterality Date   Exploration of mediastinum, extracorporeal circulation,  11/23/10   Dr Laneta Simmers   s/p cagb x 4  11/22/10   Dr Laneta Simmers    Medications: I have reviewed the patient's current medications. Allergies:  Allergies  Allergen Reactions   Sertraline Hcl Rash    Family History  Problem Relation Age of Onset   Heart disease Other    Social History:  reports that he has been smoking. He does not have any smokeless tobacco history on file. He reports current alcohol use. No history on file for drug use.  ROS: All systems are reviewed and negative except as noted. Rst negative   Physical Exam:  Vital signs in last 24 hours: Temp:  [97.7 F (36.5 C)-98.3 F (36.8 C)] 98.3 F (36.8 C) (07/26 2315) Pulse Rate:  [70-89] 84 (07/26 2315) Resp:  [14-21] 18 (07/26 2315) BP: (112-168)/(73-97) 150/91 (07/26 2315) SpO2:  [92 %-99 %] 93 % (07/26 2315)  Cardiovascular: Skin warm; not flushed Respiratory: Breaths quiet; no shortness of breath Abdomen: No masses Neurological: Normal sensation to touch Musculoskeletal: Normal motor function arms and legs Lymphatics: No inguinal  adenopathy Skin: No rashes Genitourinary:nondistended; no s/p tenderness; penis tender; 3 way 18 Fr light pink urine; irrigated easily with slow CBI; no clots or bright red blood  Laboratory Data:  Results for orders placed or performed during the hospital encounter of 11/07/21 (from the past 72 hour(s))  Urinalysis, Routine w reflex microscopic Urine, Clean Catch     Status: Abnormal   Collection Time: 11/07/21  5:16 PM  Result Value Ref Range   Color, Urine RED (A) YELLOW    Comment: BIOCHEMICALS MAY BE AFFECTED BY COLOR   APPearance TURBID (A) CLEAR   Specific Gravity, Urine NOT DONE 1.005 - 1.030   pH  5.0 - 8.0    TEST NOT REPORTED DUE TO COLOR INTERFERENCE OF URINE PIGMENT   Glucose, UA (A) NEGATIVE mg/dL    TEST NOT REPORTED DUE TO COLOR INTERFERENCE OF URINE PIGMENT   Hgb urine dipstick (A) NEGATIVE    TEST NOT REPORTED DUE TO COLOR INTERFERENCE OF URINE PIGMENT   Bilirubin Urine (A) NEGATIVE    TEST NOT REPORTED DUE TO COLOR INTERFERENCE OF URINE PIGMENT   Ketones, ur (A) NEGATIVE mg/dL    TEST NOT REPORTED DUE TO COLOR INTERFERENCE OF URINE PIGMENT   Protein, ur NEGATIVE NEGATIVE mg/dL   Nitrite (A) NEGATIVE    TEST NOT REPORTED DUE TO COLOR INTERFERENCE OF URINE PIGMENT   Leukocytes,Ua (A) NEGATIVE    TEST NOT REPORTED DUE TO COLOR INTERFERENCE OF URINE PIGMENT  Comment: Performed at [facility] [place] [facility], [address], [place], [place] [address]  Urinalysis, Microscopic (reflex)     Status: Abnormal   Collection Time: 11/07/21  5:16 PM  Result Value Ref Range   RBC / HPF >50 0 - 5 RBC/hpf   WBC, UA 6-10 0 - 5 WBC/hpf   Bacteria, UA MANY (A) NONE SEEN   Squamous Epithelial / LPF NONE SEEN 0 - 5   Mucus PRESENT     Comment: Performed at [facility], [address], [place], [place] [address]  CBC     Status: Abnormal   Collection Time: 11/07/21  5:32 PM  Result Value Ref Range   WBC 8.7 4.0 - 10.5 K/uL   RBC 4.18 (L) 4.22 - 5.81 MIL/uL   Hemoglobin 15.3  13.0 - 17.0 g/dL   HCT 74.0 81.4 - 48.1 %   MCV 103.3 (H) 80.0 - 100.0 fL   MCH 36.6 (H) 26.0 - 34.0 pg   MCHC 35.4 30.0 - 36.0 g/dL   RDW 85.6 31.4 - 97.0 %   Platelets 205 150 - 400 K/uL   nRBC 0.0 0.0 - 0.2 %    Comment: Performed at [facility], [address], [place], [place] [address]  Comprehensive metabolic panel     Status: Abnormal   Collection Time: 11/07/21  5:32 PM  Result Value Ref Range   Sodium 126 (L) 135 - 145 mmol/L   Potassium 4.2 3.5 - 5.1 mmol/L   Chloride 93 (L) 98 - 111 mmol/L   CO2 22 22 - 32 mmol/L   Glucose, Bld 106 (H) 70 - 99 mg/dL    Comment: Glucose reference range applies only to samples taken after fasting for at least 8 hours.   BUN <5 (L) 8 - 23 mg/dL   Creatinine, Ser 5.88 0.61 - 1.24 mg/dL   Calcium 8.6 (L) 8.9 - 10.3 mg/dL   Total Protein 6.9 6.5 - 8.1 g/dL   Albumin 3.6 3.5 - 5.0 g/dL   AST 48 (H) 15 - 41 U/L   ALT 40 0 - 44 U/L   Alkaline Phosphatase 87 38 - 126 U/L   Total Bilirubin 1.2 0.3 - 1.2 mg/dL   GFR, Estimated >50 >27 mL/min    Comment: (NOTE) Calculated using the CKD-EPI Creatinine Equation (2021)    Anion gap 11 5 - 15    Comment: Performed at [facility], [address] [place], [place] [address]   No results found for this or any previous visit (from the past 240 hour(s)). Creatinine: Recent Labs    11/07/21 1732  CREATININE 0.70    Xrays: See report/chart reviewed  Impression/Assessment:  Gross hematuria with possible mass in bladder with right hydro; picture drawn  Plan:  Did not change catheter since it looked good Irrigate PRN CBI until urology reassesses tomorrow Send urine for c/s Hold ASA Please start broad spectrum antibiotic  Will follow  Tyreesha Maharaj A Toriann Spadoni 11/07/2021, 11:36 PM

## 2021-11-07 NOTE — ED Notes (Signed)
Patient cc of new chest pain and back pain.   ED provider made aware

## 2021-11-07 NOTE — ED Provider Notes (Signed)
[facility] EMERGENCY DEPARTMENT Provider Note   CSN: [id number] Arrival date & time: 11/07/21  1614     History  Chief Complaint  Patient presents with   Hematuria    [name] is a 61 y.o. male.  Pt is a 61 yo male with a pmhx significant for CAD s/p CABG, CAD, GERD, and kidney stones.  Pt has not been to a doctor in several years.  Pt developed hematuria 3 days ago.  He also has back and abd pain.  He said he has had a kidney stone in the past, but none recently.  Pt is not taking blood thinners.  He only takes asa 81 mg.       Home Medications Prior to Admission medications   Medication Sig Start Date End Date Taking? Authorizing Provider  aspirin EC 81 MG tablet Take 81 mg by mouth daily as needed for moderate pain. Swallow whole.   Yes [provider]      Allergies    Sertraline hcl    Review of Systems   Review of Systems  Gastrointestinal:  Positive for abdominal pain.  Genitourinary:  Positive for hematuria.  All other systems reviewed and are negative.   Physical Exam Updated Vital Signs BP (!) 150/91   Pulse 84   Temp 98.3 F (36.8 C)   Resp 18   SpO2 93%  Physical Exam Vitals and nursing note reviewed.  Constitutional:      Appearance: Normal appearance. He is obese.  HENT:     Head: Normocephalic and atraumatic.     Right Ear: External ear normal.     Left Ear: External ear normal.     Nose: Nose normal.     Mouth/Throat:     Mouth: Mucous membranes are moist.     Pharynx: Oropharynx is clear.  Eyes:     Extraocular Movements: Extraocular movements intact.     Conjunctiva/sclera: Conjunctivae normal.     Pupils: Pupils are equal, round, and reactive to light.  Cardiovascular:     Rate and Rhythm: Normal rate and regular rhythm.     Pulses: Normal pulses.     Heart sounds: Normal heart sounds.  Pulmonary:     Effort: Pulmonary effort is normal.     Breath sounds: Normal breath sounds.  Abdominal:      General: Abdomen is flat. Bowel sounds are normal.     Palpations: Abdomen is soft.     Tenderness: There is abdominal tenderness in the suprapubic area.  Genitourinary:    Comments: Grossly bloody urine Musculoskeletal:        General: Normal range of motion.     Cervical back: Normal range of motion and neck supple.  Skin:    General: Skin is warm.     Capillary Refill: Capillary refill takes less than 2 seconds.  Neurological:     General: No focal deficit present.     Mental Status: He is alert and oriented to person, place, and time.  Psychiatric:        Mood and Affect: Mood normal.        Behavior: Behavior normal.     ED Results / Procedures / Treatments   Labs (all labs ordered are listed, but only abnormal results are displayed) Labs Reviewed  CBC - Abnormal; Notable for the following components:      Result Value   RBC 4.18 (*)    MCV 103.3 (*)    [facility]  36.6 (*)    All other components within normal limits  COMPREHENSIVE METABOLIC PANEL - Abnormal; Notable for the following components:   Sodium 126 (*)    Chloride 93 (*)    Glucose, Bld 106 (*)    BUN <5 (*)    Calcium 8.6 (*)    AST 48 (*)    All other components within normal limits  URINALYSIS, ROUTINE W REFLEX MICROSCOPIC - Abnormal; Notable for the following components:   Color, Urine RED (*)    APPearance TURBID (*)    Glucose, UA   (*)    Value: TEST NOT REPORTED DUE TO COLOR INTERFERENCE OF URINE PIGMENT   Hgb urine dipstick   (*)    Value: TEST NOT REPORTED DUE TO COLOR INTERFERENCE OF URINE PIGMENT   Bilirubin Urine   (*)    Value: TEST NOT REPORTED DUE TO COLOR INTERFERENCE OF URINE PIGMENT   Ketones, ur   (*)    Value: TEST NOT REPORTED DUE TO COLOR INTERFERENCE OF URINE PIGMENT   Nitrite   (*)    Value: TEST NOT REPORTED DUE TO COLOR INTERFERENCE OF URINE PIGMENT   Leukocytes,Ua   (*)    Value: TEST NOT REPORTED DUE TO COLOR INTERFERENCE OF URINE PIGMENT   All other components within normal  limits  URINALYSIS, MICROSCOPIC (REFLEX) - Abnormal; Notable for the following components:   Bacteria, UA MANY (*)    All other components within normal limits  URINE CULTURE  CULTURE, BLOOD (ROUTINE X 2)  CULTURE, BLOOD (ROUTINE X 2)  OSMOLALITY  ETHANOL  LIPID PANEL  HIV ANTIBODY (ROUTINE TESTING W REFLEX)  TROPONIN I (HIGH SENSITIVITY)  TROPONIN I (HIGH SENSITIVITY)    EKG None  Radiology DG Chest Portable 1 View  Result Date: 11/07/2021 CLINICAL DATA:  cp EXAM: PORTABLE CHEST 1 VIEW.  Patient is rotated. COMPARISON:  Chest x-ray 12/18/2010 FINDINGS: The heart and mediastinal contours are within normal limits. Surgical changes overlie the mediastinum. Left base hazy airspace opacity. No pulmonary edema. Query trace left pleural effusion. No right pleural effusion. No pneumothorax. No acute osseous abnormality. IMPRESSION: 1. Left base hazy airspace opacity that may represent a combination of infection/inflammation versus atelectasis. Recommend repeat PA and lateral view of the chest further evaluation. 2. Query trace left pleural effusion. Electronically Signed   By: Tish Frederickson M.D.   On: 11/07/2021 23:24   CT ABDOMEN PELVIS W CONTRAST  Result Date: 11/07/2021 CLINICAL DATA:  Hematuria EXAM: CT ABDOMEN AND PELVIS WITH CONTRAST TECHNIQUE: Multidetector CT imaging of the abdomen and pelvis was performed using the standard protocol following bolus administration of intravenous contrast. RADIATION DOSE REDUCTION: This exam was performed according to the departmental dose-optimization program which includes automated exposure control, adjustment of the mA and/or kV according to patient size and/or use of iterative reconstruction technique. CONTRAST:  OMNIPAQUE IOHEXOL 300 MG/ML  SOLN COMPARISON:  None Available. FINDINGS: Lower chest: Lung bases demonstrate pleural and parenchymal scarring at the left base. Coronary vascular calcification. Hepatobiliary: No focal liver abnormality  is seen. No gallstones, gallbladder wall thickening, or biliary dilatation. Pancreas: Unremarkable. No pancreatic ductal dilatation or surrounding inflammatory changes. Spleen: Normal in size without focal abnormality. Adrenals/Urinary Tract: Adrenal glands are normal. Mild right hydronephrosis and hydroureter. No obstructing stone. 17 mm slightly dense lesion mid pole right kidney without appreciable enhancement on delayed views suggesting hemorrhagic or proteinaceous complicated cyst, no specific follow-up imaging is recommended. Foley catheter in the bladder. Irregular hyperdense filling  defect within the posterior bladder, likely the source of obstruction on the right. Moderate urine in the bladder on delayed views. The bladder is slightly thick walled in appearance. Stomach/Bowel: Stomach is within normal limits. Appendix appears normal. No evidence of bowel wall thickening, distention, or inflammatory changes. Diverticular disease of the left colon without acute wall thickening. Vascular/Lymphatic: Nonaneurysmal aorta. Moderate aortic atherosclerosis. No suspicious lymph nodes. Reproductive: Prostate is unremarkable. Other: Negative for pelvic effusion or free air Musculoskeletal: No acute osseous abnormality IMPRESSION: 1. Mild right hydronephrosis and hydroureter without obstructing stone. Irregular hyperdense filling defect within the posterior bladder which could reflect hematoma or possible mass. Suggest correlation with direct visualization. 2. Diverticular disease of the left colon without acute inflammatory process. Electronically Signed   By: Jasmine Pang M.D.   On: 11/07/2021 22:51    Procedures Procedures    Medications Ordered in ED Medications  sodium chloride 0.9 % bolus 500 mL (0 mLs Intravenous Hold 11/07/21 2006)  sodium chloride irrigation 0.9 % 3,000 mL (3,000 mLs Irrigation New Bag/Given 11/07/21 1950)  cefTRIAXone (ROCEPHIN) 2 g in sodium chloride 0.9 % 100 mL IVPB (has no  administration in time range)  morphine (PF) 4 MG/ML injection 4 mg (4 mg Intravenous Given 11/07/21 2004)  ondansetron (ZOFRAN) injection 4 mg (4 mg Intravenous Given 11/07/21 2004)  alum & mag hydroxide-simeth (MAALOX/MYLANTA) 200-200-20 MG/5ML suspension 30 mL (30 mLs Oral Given 11/07/21 2311)  iohexol (OMNIPAQUE) 300 MG/ML solution 100 mL (100 mLs Intravenous Contrast Given 11/07/21 2233)    ED Course/ Medical Decision Making/ A&P                           Medical Decision Making Amount and/or Complexity of Data Reviewed Radiology: ordered.  Risk OTC drugs. Prescription drug management.   This patient presents to the ED for concern of hematuria, this involves an extensive number of treatment options, and is a complaint that carries with it a high risk of complications and morbidity.  The differential diagnosis includes kidney stone, uti, bladder cancer   Co morbidities that complicate the patient evaluation  CAD s/p CABG, CAD, GERD, and kidney stones   Additional history obtained:  Additional history obtained from epic chart review External records from outside source obtained and reviewed including wife   Lab Tests:  I Ordered, and personally interpreted labs.  The pertinent results include:  cmp with sodium low at 126; urine grossly bloody, but with many bacteria, cbc nl   Imaging Studies ordered:  I ordered imaging studies including cxr and ct abd/pelvis  I independently visualized and interpreted imaging which showed  CXR: IMPRESSION:  1. Left base hazy airspace opacity that may represent a combination  of infection/inflammation versus atelectasis. Recommend repeat PA  and lateral view of the chest further evaluation.  2. Query trace left pleural effusion.   CT abd/pelvis:  IMPRESSION:  1. Mild right hydronephrosis and hydroureter without obstructing  stone. Irregular hyperdense filling defect within the posterior  bladder which could reflect hematoma or  possible mass. Suggest  correlation with direct visualization.  2. Diverticular disease of the left colon without acute inflammatory  process.    I agree with the radiologist interpretation   Cardiac Monitoring:  The patient was maintained on a cardiac monitor.  I personally viewed and interpreted the cardiac monitored which showed an underlying rhythm of: nsr   Medicines ordered and prescription drug management:  I ordered medication including rocephin  for uti  Reevaluation of the patient after these medicines showed that the patient improved I have reviewed the patients home medicines and have made adjustments as needed   Test Considered:  ct   Critical Interventions:  foley   Consultations Obtained:  I requested consultation with the urologist (Dr. Matilde Sprang),  and discussed lab and imaging findings as well as pertinent plan - he will see pt in consult Pt d/w Dr. Nevada Crane (triad) for admission.   Problem List / ED Course:  Urinary retention:  post void residual > 1000cc urine.  3 way foley placed and CBI started.  Urine remains bloody. Gross hematuria:  pt given rocephin.  Urine sent for culture.  ? Mass in bladder.  Urology will see. Hyponatremia:  lipid panel ordered, osm ordered   Reevaluation:  After the interventions noted above, I reevaluated the patient and found that they have :improved   Social Determinants of Health:  Lives at home with wife   Dispostion:  After consideration of the diagnostic results and the patients response to treatment, I feel that the patent would benefit from admission.          Final Clinical Impression(s) / ED Diagnoses Final diagnoses:  Gross hematuria  Acute cystitis with hematuria  Hyponatremia  Urinary retention    Rx / DC Orders ED Discharge Orders     None         Isla Pence, MD 11/07/21 2345

## 2021-11-07 NOTE — H&P (Signed)
History and Physical  RAD GRAMLING BTD:[id number] DOB: 1961-03-23 DOA: 11/07/2021  Referring physician: Dr. Particia Nearing, EDP  PCP: Pcp, No  Outpatient Specialists: Urology Patient coming from: Home  Chief Complaint:  Gross hematuria.  HPI: Noah Rosales is a 61 y.o. male with medical history significant for coronary artery disease status post CABG, GERD, nephrolithiasis, who presented to [facility] ED from home with concern for gross hematuria x3 days.  Associated symptoms include dysuria.  Denies any subjective fevers.  Last dose of home aspirin was yesterday morning.  He presented to the ED for further evaluation.  In the ED, CT scan shows gross hematuria with possible mass in bladder.  Right hydronephrosis.  Seen by urologist recommended to irrigate bladder until urine output is no longer bloody and until urology assessed in the morning.  The patient was admitted by the hospitalist service, [facility].  ED Course: Tmax 98.3.  BP 118/72, pulse 93, respiratory rate 16, O2 saturation 93% on 2 L.  Lab studies significant for serum sodium 126, chloride 93, glucose 106, CBG 126.  Review of Systems: Review of systems as noted in the HPI. All other systems reviewed and are negative.   Past Medical History:  Diagnosis Date   Alcohol abuse    CAD (coronary artery disease)    GERD (gastroesophageal reflux disease)    Myocardial infarction [facility]) 2005   s/p stent to right coronary artery   Past Surgical History:  Procedure Laterality Date   Exploration of mediastinum, extracorporeal circulation,  11/23/10   Dr Laneta Simmers   s/p cagb x 4  11/22/10   Dr Laneta Simmers    Social History:  reports that he has been smoking. He does not have any smokeless tobacco history on file. He reports current alcohol use. No history on file for drug use.   Allergies  Allergen Reactions   Sertraline Hcl Rash    Family History  Problem Relation Age of Onset   Heart disease Other       Prior to Admission medications    Medication Sig Start Date End Date Taking? Authorizing Provider  aspirin EC 81 MG tablet Take 81 mg by mouth daily as needed for moderate pain. Swallow whole.   Yes [provider]    Physical Exam: BP (!) 150/91   Pulse 84   Temp 98.3 F (36.8 C)   Resp 18   SpO2 93%   General: 61 y.o. year-old male well developed well nourished in no acute distress.  Alert and oriented x3. Cardiovascular: Regular rate and rhythm with no rubs or gallops.  No thyromegaly or JVD noted.  No lower extremity edema. 2/4 pulses in all 4 extremities. Respiratory: Clear to auscultation with no wheezes or rales. Good inspiratory effort. Abdomen: Soft nontender nondistended with normal bowel sounds x4 quadrants. Muskuloskeletal: No cyanosis, clubbing or edema noted bilaterally Neuro: CN II-XII intact, strength, sensation, reflexes Skin: No ulcerative lesions noted or rashes Psychiatry: Judgement and insight appear normal. Mood is appropriate for condition and setting          Labs on Admission:  Basic Metabolic Panel: Recent Labs  Lab 11/07/21 1732  NA 126*  K 4.2  CL 93*  CO2 22  GLUCOSE 106*  BUN <5*  CREATININE 0.70  CALCIUM 8.6*   Liver Function Tests: Recent Labs  Lab 11/07/21 1732  AST 48*  ALT 40  ALKPHOS 87  BILITOT 1.2  PROT 6.9  ALBUMIN 3.6   No results for input(s): "LIPASE", "AMYLASE" in  the last 168 hours. No results for input(s): "AMMONIA" in the last 168 hours. CBC: Recent Labs  Lab 11/07/21 1732  WBC 8.7  HGB 15.3  HCT 43.2  MCV 103.3*  PLT 205   Cardiac Enzymes: No results for input(s): "CKTOTAL", "CKMB", "CKMBINDEX", "TROPONINI" in the last 168 hours.  BNP (last 3 results) No results for input(s): "BNP" in the last 8760 hours.  ProBNP (last 3 results) No results for input(s): "PROBNP" in the last 8760 hours.  CBG: No results for input(s): "GLUCAP" in the last 168 hours.  Radiological Exams on Admission: DG Chest Portable 1 View  Result  Date: 11/07/2021 CLINICAL DATA:  cp EXAM: PORTABLE CHEST 1 VIEW.  Patient is rotated. COMPARISON:  Chest x-ray 12/18/2010 FINDINGS: The heart and mediastinal contours are within normal limits. Surgical changes overlie the mediastinum. Left base hazy airspace opacity. No pulmonary edema. Query trace left pleural effusion. No right pleural effusion. No pneumothorax. No acute osseous abnormality. IMPRESSION: 1. Left base hazy airspace opacity that may represent a combination of infection/inflammation versus atelectasis. Recommend repeat PA and lateral view of the chest further evaluation. 2. Query trace left pleural effusion. Electronically Signed   By: Tish Frederickson M.D.   On: 11/07/2021 23:24   CT ABDOMEN PELVIS W CONTRAST  Result Date: 11/07/2021 CLINICAL DATA:  Hematuria EXAM: CT ABDOMEN AND PELVIS WITH CONTRAST TECHNIQUE: Multidetector CT imaging of the abdomen and pelvis was performed using the standard protocol following bolus administration of intravenous contrast. RADIATION DOSE REDUCTION: This exam was performed according to the departmental dose-optimization program which includes automated exposure control, adjustment of the mA and/or kV according to patient size and/or use of iterative reconstruction technique. CONTRAST:  OMNIPAQUE IOHEXOL 300 MG/ML  SOLN COMPARISON:  None Available. FINDINGS: Lower chest: Lung bases demonstrate pleural and parenchymal scarring at the left base. Coronary vascular calcification. Hepatobiliary: No focal liver abnormality is seen. No gallstones, gallbladder wall thickening, or biliary dilatation. Pancreas: Unremarkable. No pancreatic ductal dilatation or surrounding inflammatory changes. Spleen: Normal in size without focal abnormality. Adrenals/Urinary Tract: Adrenal glands are normal. Mild right hydronephrosis and hydroureter. No obstructing stone. 17 mm slightly dense lesion mid pole right kidney without appreciable enhancement on delayed views suggesting  hemorrhagic or proteinaceous complicated cyst, no specific follow-up imaging is recommended. Foley catheter in the bladder. Irregular hyperdense filling defect within the posterior bladder, likely the source of obstruction on the right. Moderate urine in the bladder on delayed views. The bladder is slightly thick walled in appearance. Stomach/Bowel: Stomach is within normal limits. Appendix appears normal. No evidence of bowel wall thickening, distention, or inflammatory changes. Diverticular disease of the left colon without acute wall thickening. Vascular/Lymphatic: Nonaneurysmal aorta. Moderate aortic atherosclerosis. No suspicious lymph nodes. Reproductive: Prostate is unremarkable. Other: Negative for pelvic effusion or free air Musculoskeletal: No acute osseous abnormality IMPRESSION: 1. Mild right hydronephrosis and hydroureter without obstructing stone. Irregular hyperdense filling defect within the posterior bladder which could reflect hematoma or possible mass. Suggest correlation with direct visualization. 2. Diverticular disease of the left colon without acute inflammatory process. Electronically Signed   By: Jasmine Pang M.D.   On: 11/07/2021 22:51    EKG: I independently viewed the EKG done and my findings are as followed: Sinus rhythm rate of 79.  No significant ST-T changes.  QTc 333.  Assessment/Plan Present on Admission:  Gross hematuria  Principal Problem:   Gross hematuria  Gross hematuria with pyuria UA positive for pyuria, gross hematuria for the past  3 days. Seen by urology, recommended to continue to hold off home aspirin Follow urine culture and blood cultures x2 peripherally. Started on Rocephin empirically Gentle IV fluid hydration NS at 50 cc/h x 2 days.  Hypovolemic hyponatremia Serum sodium 126 Started normal saline at 50 cc/h x 1 day. Repeat BMP in the morning.  Possible bladder mass N.p.o. after midnight or until seen by urology.    DVT prophylaxis: SCDs.   Pharmacological DVT prophylaxis contraindicated in the setting of active bleeding.  Full code  Code Status: Full code  Family Communication: None at bedside  Disposition Plan: Admitted to telemetry medical unit.  Consults called: Urology.  Admission status: Inpatient status.   Status is: Inpatient The patient requires at least 2 midnights for further evaluation and treatment of present condition.   Darlin Drop MD [facility] Pager [phone]  If 7PM-7AM, please contact night-coverage [email or web address] Password [facility]  11/07/2021, 11:39 PM

## 2021-11-07 NOTE — ED Notes (Signed)
Patient transported to CT 

## 2021-11-07 NOTE — ED Triage Notes (Signed)
Patient here with compliant of hematuria that started three days ago, history of kidney stones. States this hurts more intensely than when he had a kidney stone. Patient is alert, oriented, and in no apparent distress at this time.

## 2021-11-07 NOTE — ED Notes (Signed)
Patient roomed

## 2021-11-07 NOTE — ED Notes (Signed)
ED Provider at bedside. 

## 2021-11-07 NOTE — ED Notes (Signed)
Post void bladder scan: >949mL   Provider notified

## 2021-11-07 NOTE — ED Provider Triage Note (Signed)
Emergency Medicine Provider Triage Evaluation Note  Noah Rosales , a 61 y.o. male  was evaluated in triage.  Pt complains of hematuria and lower abdominal pain. States that same has been ongoing for the past 3 days and he is currently urinating bright red with large clots. States his pain is located throughout his lower abdomen and around to his back. Endorses hx of kidney stones, states his pain is worse than when he has had kidney stones in the past.  Review of Systems  Positive:  Negative:   Physical Exam  BP (!) 152/97   Pulse 89   Temp 97.7 F (36.5 C) (Oral)   Resp 16   SpO2 96%  Gen:   Awake, no distress   Resp:  Normal effort  MSK:   Moves extremities without difficulty  Other:    Medical Decision Making  Medically screening exam initiated at 5:16 PM.  Appropriate orders placed.  Noah Rosales was informed that the remainder of the evaluation will be completed by another provider, this initial triage assessment does not replace that evaluation, and the importance of remaining in the ED until their evaluation is complete.     Noah Bandy, PA-C 11/07/21 1718

## 2021-11-08 ENCOUNTER — Other Ambulatory Visit: Payer: Self-pay

## 2021-11-08 DIAGNOSIS — R310 Gross hematuria: Secondary | ICD-10-CM | POA: Diagnosis not present

## 2021-11-08 DIAGNOSIS — E871 Hypo-osmolality and hyponatremia: Secondary | ICD-10-CM

## 2021-11-08 DIAGNOSIS — N3289 Other specified disorders of bladder: Secondary | ICD-10-CM

## 2021-11-08 LAB — COMPREHENSIVE METABOLIC PANEL
ALT: 37 U/L (ref 0–44)
AST: 40 U/L (ref 15–41)
Albumin: 3.5 g/dL (ref 3.5–5.0)
Alkaline Phosphatase: 77 U/L (ref 38–126)
Anion gap: 9 (ref 5–15)
BUN: <5 mg/dL — ABNORMAL LOW (ref 8–23)
CO2: 26 mmol/L (ref 22–32)
Calcium: 8.5 mg/dL — ABNORMAL LOW (ref 8.9–10.3)
Chloride: 98 mmol/L (ref 98–111)
Creatinine, Ser: 0.69 mg/dL (ref 0.61–1.24)
GFR, Estimated: >60 mL/min (ref >60)
Glucose, Bld: 106 mg/dL — ABNORMAL HIGH (ref 70–99)
Potassium: 4.2 mmol/L (ref 3.5–5.1)
Sodium: 133 mmol/L — ABNORMAL LOW (ref 135–145)
Total Bilirubin: 1.6 mg/dL — ABNORMAL HIGH (ref 0.3–1.2)
Total Protein: 6.6 g/dL (ref 6.5–8.1)

## 2021-11-08 LAB — CBC WITH DIFFERENTIAL/PLATELET
Abs Immature Granulocytes: 0.02 10*3/uL (ref 0.00–0.07)
Basophils Absolute: 0 10*3/uL (ref 0.0–0.1)
Basophils Relative: 0 %
Eosinophils Absolute: 0 10*3/uL (ref 0.0–0.5)
Eosinophils Relative: 1 %
HCT: 41.9 % (ref 39.0–52.0)
Hemoglobin: 15 g/dL (ref 13.0–17.0)
Immature Granulocytes: 0 %
Lymphocytes Relative: 11 %
Lymphs Abs: 0.9 10*3/uL (ref 0.7–4.0)
MCH: 36.3 pg — ABNORMAL HIGH (ref 26.0–34.0)
MCHC: 35.8 g/dL (ref 30.0–36.0)
MCV: 101.5 fL — ABNORMAL HIGH (ref 80.0–100.0)
Monocytes Absolute: 0.7 10*3/uL (ref 0.1–1.0)
Monocytes Relative: 9 %
Neutro Abs: 6.2 10*3/uL (ref 1.7–7.7)
Neutrophils Relative %: 79 %
Platelets: 188 10*3/uL (ref 150–400)
RBC: 4.13 MIL/uL — ABNORMAL LOW (ref 4.22–5.81)
RDW: 12.1 % (ref 11.5–15.5)
WBC: 7.9 10*3/uL (ref 4.0–10.5)
nRBC: 0 % (ref 0.0–0.2)

## 2021-11-08 LAB — LIPID PANEL
Cholesterol: 229 mg/dL — ABNORMAL HIGH (ref 0–200)
HDL: 53 mg/dL (ref >40)
LDL Cholesterol: 157 mg/dL — ABNORMAL HIGH (ref 0–99)
Total CHOL/HDL Ratio: 4.3 RATIO
Triglycerides: 93 mg/dL (ref <150)
VLDL: 19 mg/dL (ref 0–40)

## 2021-11-08 LAB — PHOSPHORUS: Phosphorus: 3.9 mg/dL (ref 2.5–4.6)

## 2021-11-08 LAB — MAGNESIUM: Magnesium: 2.2 mg/dL (ref 1.7–2.4)

## 2021-11-08 LAB — ETHANOL: Alcohol, Ethyl (B): 10 mg/dL — ABNORMAL HIGH (ref <10)

## 2021-11-08 LAB — TROPONIN I (HIGH SENSITIVITY)
Troponin I (High Sensitivity): 4 ng/L (ref <18)
Troponin I (High Sensitivity): 4 ng/L (ref <18)

## 2021-11-08 LAB — HIV ANTIBODY (ROUTINE TESTING W REFLEX): HIV Screen 4th Generation wRfx: NONREACTIVE

## 2021-11-08 LAB — OSMOLALITY: Osmolality: 280 mOsm/kg (ref 275–295)

## 2021-11-08 MED ORDER — FOLIC ACID 1 MG PO TABS
1.0000 mg | ORAL_TABLET | Freq: Every day | ORAL | Status: DC
  Administered 2021-11-08 – 2021-11-10 (×3): 1 mg via ORAL
  Filled 2021-11-08 (×3): qty 1

## 2021-11-08 MED ORDER — THIAMINE HCL 100 MG PO TABS
100.0000 mg | ORAL_TABLET | Freq: Every day | ORAL | Status: DC
Start: 2021-11-08 — End: 2021-11-10
  Administered 2021-11-08 – 2021-11-09 (×2): 100 mg via ORAL
  Filled 2021-11-08 (×3): qty 1

## 2021-11-08 MED ORDER — OXYCODONE HCL 5 MG PO TABS
5.0000 mg | ORAL_TABLET | Freq: Four times a day (QID) | ORAL | Status: AC | PRN
  Administered 2021-11-08 (×2): 5 mg via ORAL
  Filled 2021-11-08 (×2): qty 1

## 2021-11-08 MED ORDER — HYDROMORPHONE HCL 1 MG/ML IJ SOLN
0.5000 mg | INTRAMUSCULAR | Status: DC | PRN
  Administered 2021-11-09 (×2): 0.5 mg via INTRAVENOUS
  Filled 2021-11-08 (×2): qty 1

## 2021-11-08 MED ORDER — THIAMINE HCL 100 MG/ML IJ SOLN
100.0000 mg | Freq: Every day | INTRAMUSCULAR | Status: DC
Start: 2021-11-08 — End: 2021-11-10
  Administered 2021-11-10: 100 mg via INTRAVENOUS

## 2021-11-08 MED ORDER — ADULT MULTIVITAMIN W/MINERALS CH
1.0000 | ORAL_TABLET | Freq: Every day | ORAL | Status: DC
Start: 2021-11-08 — End: 2021-11-10
  Administered 2021-11-08 – 2021-11-10 (×3): 1 via ORAL
  Filled 2021-11-08 (×3): qty 1

## 2021-11-08 MED ORDER — ACETAMINOPHEN 325 MG PO TABS
650.0000 mg | ORAL_TABLET | Freq: Four times a day (QID) | ORAL | Status: DC | PRN
  Administered 2021-11-08 – 2021-11-09 (×2): 650 mg via ORAL
  Filled 2021-11-08 (×3): qty 2

## 2021-11-08 MED ORDER — LORAZEPAM 1 MG PO TABS: 1.0000 mg | ORAL_TABLET | ORAL | Status: DC | PRN

## 2021-11-08 MED ORDER — POLYETHYLENE GLYCOL 3350 17 G PO PACK: 17.0000 g | PACK | Freq: Every day | ORAL | Status: DC | PRN

## 2021-11-08 MED ORDER — LORAZEPAM 2 MG/ML IJ SOLN: 0.0000 mg | Freq: Two times a day (BID) | INTRAMUSCULAR | Status: DC

## 2021-11-08 MED ORDER — MELATONIN 5 MG PO TABS
5.0000 mg | ORAL_TABLET | Freq: Every evening | ORAL | Status: DC | PRN
  Filled 2021-11-08: qty 1

## 2021-11-08 MED ORDER — CHLORHEXIDINE GLUCONATE CLOTH 2 % EX PADS
6.0000 | MEDICATED_PAD | Freq: Every day | CUTANEOUS | Status: DC
  Administered 2021-11-08 – 2021-11-10 (×3): 6 via TOPICAL

## 2021-11-08 MED ORDER — LORAZEPAM 2 MG/ML IJ SOLN: 0.0000 mg | Freq: Four times a day (QID) | INTRAMUSCULAR | Status: AC

## 2021-11-08 MED ORDER — LORAZEPAM 2 MG/ML IJ SOLN: 1.0000 mg | INTRAMUSCULAR | Status: DC | PRN

## 2021-11-08 MED ORDER — SODIUM CHLORIDE 0.9 % IV SOLN: INTRAVENOUS | Status: AC

## 2021-11-08 NOTE — Progress Notes (Signed)
CSW received consult for substance use for patient. CSW spoke with patient. CSW offered patient outpatient substance use resources. Patient declined. All questions answered. No further questions reported at this time.

## 2021-11-08 NOTE — Progress Notes (Signed)
Looks good Clear to dark urine 2 small clots once and almost no drip CBI Stop CBI Send home on broad spectrum antibiotic tomorrow if draining OK with foley- call Urology and we will see next week Bactrim or cipro is fine pending c/s

## 2021-11-08 NOTE — Progress Notes (Signed)
PROGRESS NOTE  Noah Rosales  AJO:[id number] DOB: [birth year] DOA: 11/07/2021 PCP: Pcp, No   Brief Narrative:  Patient is a 61 year old male with history of coronary artery disease status post CABG, GERD, nephrolithiasis, noncompliance, current alcohol use/smoking who presented here with complaints of gross hematuria for 3 days along with dysuria.  CT imaging done in the emergency department showed possible mass in the bladder, right hydronephrosis.  Urology consulted and following.  On presentation he was hemodynamically stable.  Lab work showed serum sodium of 126.   Assessment & Plan:  Principal Problem:   Gross hematuria Active Problems:   CAD (coronary artery disease)   Alcohol abuse   Hyponatremia   Bladder mass  Gross hematuria/pyuria: Patient was having hematuria for 3 days.  UA positive for pyuria, red blood cells.  Takes aspirin at home which is on hold.  Follow-up urine culture, blood culture. S/P foley placement. Started on ceftriaxone.  Continue gentle IV fluids.  Urology following  Bladder mass: As seen on the CT imaging. Showed mild right hydronephrosis and hydroureter without obstructing stone,irregular hyperdense filling defect within the posterior bladder which could reflect hematoma or possible mass. Urology following.  Might need cystoscopy  Hyponatremia: This is most likely hypovolemic hyponatremia secondary to dehydration.  Sodium has improved with IV fluid.  Currently stable.  Hyperlipidemia: LDL of 157.  Might need to start on statin given history of coronary artery disease status post CABG.  History of coronary artery disease: Status post CABG.  Patient is super noncompliant.  He has not seen a doctor for last 11 years.  He takes aspirin only as needed.  We may need to continue aspirin and start on high-dose statin on discharge  Alcohol abuse: Drinks 6-8 beers a day.  CIWA protocol started.  Continue thiamine folic acid  Tobacco use: Counseled cessation.   Smokes a pack a day.  Obesity:BMI 31.5        DVT prophylaxis:SCDs Start: 11/07/21 2339     Code Status: Full Code  Family Communication: None at bedside  Patient status:Inpatient  Patient is from :Home  Anticipated discharge NO:BSJG  Estimated DC date:in 2-3 days   Consultants: Urology  Procedures:None  Antimicrobials:  Anti-infectives (From admission, onward)    Start     Dose/Rate Route Frequency Ordered Stop   11/07/21 2345  cefTRIAXone (ROCEPHIN) 2 g in sodium chloride 0.9 % 100 mL IVPB        2 g 200 mL/hr over 30 Minutes Intravenous Every 24 hours 11/07/21 2338         Subjective: Patient seen and examined at the bedside this morning.  Hemodynamically stable.  Lying in bed.  Alert and oriented Foley is draining pinkish urine.  He says his urine is clearing up.  Denies any abdominal pain, nausea or vomiting.  Objective: Vitals:   11/08/21 0333 11/08/21 0456 11/08/21 0640 11/08/21 0808  BP: (!) 147/84  126/70 122/74  Pulse: 93  90 94  Resp: 20  18 18   Temp: 98.5 F (36.9 C)  98.7 F (37.1 C)   TempSrc: Oral  Oral   SpO2: 93%  92% 92%  Weight:  96.9 kg    Height:  5\' 9"  (1.753 m)      Intake/Output Summary (Last 24 hours) at 11/08/2021 1110 Last data filed at 11/08/2021 1014 Gross per 24 hour  Intake 7072.04 ml  Output 11/10/2021 ml  Net -4952.96 ml   Filed Weights   11/08/21 0456  Weight: 96.9 kg  Examination:  General exam: Overall comfortable, not in distress,obese HEENT: PERRL Respiratory system:  no wheezes or crackles  Cardiovascular system: S1 & S2 heard, RRR.  Gastrointestinal system: Abdomen is nondistended, soft and nontender. Central nervous system: Alert and oriented Extremities: No edema, no clubbing ,no cyanosis Skin: No rashes, no ulcers,no icterus   GU: Foley   Data Reviewed: I have personally reviewed following labs and imaging studies  CBC: Recent Labs  Lab 11/07/21 1732 11/08/21 0439  WBC 8.7 7.9  NEUTROABS   --  6.2  HGB 15.3 15.0  HCT 43.2 41.9  MCV 103.3* 101.5*  PLT 205 188   Basic Metabolic Panel: Recent Labs  Lab 11/07/21 1732 11/08/21 0439  NA 126* 133*  K 4.2 4.2  CL 93* 98  CO2 22 26  GLUCOSE 106* 106*  BUN <5* <5*  CREATININE 0.70 0.69  CALCIUM 8.6* 8.5*  MG  --  2.2  PHOS  --  3.9     Recent Results (from the past 240 hour(s))  Culture, blood (Routine X 2) w Reflex to ID Panel     Status: None (Preliminary result)   Collection Time: 11/08/21 12:21 AM   Specimen: BLOOD RIGHT HAND  Result Value Ref Range Status   Specimen Description BLOOD RIGHT HAND  Final   Special Requests   Final    BOTTLES DRAWN AEROBIC AND ANAEROBIC Blood Culture adequate volume   Culture   Final    NO GROWTH < 12 HOURS Performed at [facility], [address] [place], [place] [address]    Report Status PENDING  Incomplete  Culture, blood (Routine X 2) w Reflex to ID Panel     Status: None (Preliminary result)   Collection Time: 11/08/21 12:41 AM   Specimen: BLOOD LEFT HAND  Result Value Ref Range Status   Specimen Description BLOOD LEFT HAND  Final   Special Requests   Final    BOTTLES DRAWN AEROBIC AND ANAEROBIC Blood Culture adequate volume   Culture   Final    NO GROWTH < 12 HOURS Performed at [facility], [address], [place], [place] [address]    Report Status PENDING  Incomplete     Radiology Studies: DG Chest Portable 1 View  Result Date: 11/07/2021 CLINICAL DATA:  cp EXAM: PORTABLE CHEST 1 VIEW.  Patient is rotated. COMPARISON:  Chest x-ray 12/18/2010 FINDINGS: The heart and mediastinal contours are within normal limits. Surgical changes overlie the mediastinum. Left base hazy airspace opacity. No pulmonary edema. Query trace left pleural effusion. No right pleural effusion. No pneumothorax. No acute osseous abnormality. IMPRESSION: 1. Left base hazy airspace opacity that may represent a combination of infection/inflammation versus atelectasis. Recommend  repeat PA and lateral view of the chest further evaluation. 2. Query trace left pleural effusion. Electronically Signed   By: Tish Frederickson M.D.   On: 11/07/2021 23:24   CT ABDOMEN PELVIS W CONTRAST  Result Date: 11/07/2021 CLINICAL DATA:  Hematuria EXAM: CT ABDOMEN AND PELVIS WITH CONTRAST TECHNIQUE: Multidetector CT imaging of the abdomen and pelvis was performed using the standard protocol following bolus administration of intravenous contrast. RADIATION DOSE REDUCTION: This exam was performed according to the departmental dose-optimization program which includes automated exposure control, adjustment of the mA and/or kV according to patient size and/or use of iterative reconstruction technique. CONTRAST:  OMNIPAQUE IOHEXOL 300 MG/ML  SOLN COMPARISON:  None Available. FINDINGS: Lower chest: Lung bases demonstrate pleural and parenchymal scarring at the left  base. Coronary vascular calcification. Hepatobiliary: No focal liver abnormality is seen. No gallstones, gallbladder wall thickening, or biliary dilatation. Pancreas: Unremarkable. No pancreatic ductal dilatation or surrounding inflammatory changes. Spleen: Normal in size without focal abnormality. Adrenals/Urinary Tract: Adrenal glands are normal. Mild right hydronephrosis and hydroureter. No obstructing stone. 17 mm slightly dense lesion mid pole right kidney without appreciable enhancement on delayed views suggesting hemorrhagic or proteinaceous complicated cyst, no specific follow-up imaging is recommended. Foley catheter in the bladder. Irregular hyperdense filling defect within the posterior bladder, likely the source of obstruction on the right. Moderate urine in the bladder on delayed views. The bladder is slightly thick walled in appearance. Stomach/Bowel: Stomach is within normal limits. Appendix appears normal. No evidence of bowel wall thickening, distention, or inflammatory changes. Diverticular disease of the left colon without acute  wall thickening. Vascular/Lymphatic: Nonaneurysmal aorta. Moderate aortic atherosclerosis. No suspicious lymph nodes. Reproductive: Prostate is unremarkable. Other: Negative for pelvic effusion or free air Musculoskeletal: No acute osseous abnormality IMPRESSION: 1. Mild right hydronephrosis and hydroureter without obstructing stone. Irregular hyperdense filling defect within the posterior bladder which could reflect hematoma or possible mass. Suggest correlation with direct visualization. 2. Diverticular disease of the left colon without acute inflammatory process. Electronically Signed   By: Jasmine Pang M.D.   On: 11/07/2021 22:51    Scheduled Meds:  Chlorhexidine Gluconate Cloth  6 each Topical Q0600   folic acid  1 mg Oral Daily   LORazepam  0-4 mg Intravenous Q6H   Followed by   Melene Muller ON 11/10/2021] LORazepam  0-4 mg Intravenous Q12H   multivitamin with minerals  1 tablet Oral Daily   thiamine  100 mg Oral Daily   Or   thiamine  100 mg Intravenous Daily   Continuous Infusions:  sodium chloride 50 mL/hr at 11/08/21 0500   cefTRIAXone (ROCEPHIN)  IV Stopped (11/08/21 0136)   sodium chloride Stopped (11/07/21 2006)   sodium chloride irrigation       LOS: 1 day   Burnadette Pop, MD [facility] P7/27/2023, 11:10 AM

## 2021-11-09 ENCOUNTER — Encounter (HOSPITAL_COMMUNITY): Payer: Self-pay | Admitting: Internal Medicine

## 2021-11-09 DIAGNOSIS — R310 Gross hematuria: Secondary | ICD-10-CM | POA: Diagnosis not present

## 2021-11-09 LAB — BLOOD CULTURE ID PANEL (REFLEXED) - BCID2

## 2021-11-09 LAB — URINE CULTURE: Culture: NO GROWTH

## 2021-11-09 LAB — CULTURE, BLOOD (ROUTINE X 2): Special Requests: ADEQUATE

## 2021-11-09 MED ORDER — CIPROFLOXACIN HCL 500 MG PO TABS: 500.0000 mg | ORAL_TABLET | Freq: Two times a day (BID) | ORAL | 0 refills | Status: AC

## 2021-11-09 NOTE — Progress Notes (Signed)
PROGRESS NOTE  Noah Rosales  HKV:[id number] DOB: 1960/06/04 DOA: 11/07/2021 PCP: Pcp, No   Brief Narrative: Patient is a 61 year old male with history of coronary artery disease status post CABG, GERD, nephrolithiasis, noncompliance, current alcohol use/smoking who presented here with complaints of gross hematuria for 3 days along with dysuria.  CT imaging done in the emergency department showed possible mass in the bladder, right hydronephrosis.  Urology consulted and following.  On presentation he was hemodynamically stable.  Lab work showed serum sodium of 126.   Assessment & Plan:  Principal Problem:   Gross hematuria Active Problems:   CAD (coronary artery disease)   Alcohol abuse   Hyponatremia   Bladder mass  Gross hematuria/pyuria: Patient was having hematuria for 3 days.  UA positive for pyuria, red blood cells.  Takes aspirin at home which is on hold.  Urine culture, no growth so far. Blood cultures, 1 out of 4 bottles with gram variable rods.  Currently on ceftriaxone.  We will continue ceftriaxone.  Continue gentle IV fluids. Initially treated with continuous bladder irrigation, still has hematuria but freely flowing.  Monitor today. Urology recommended discharge home with Foley catheter, scheduled appointment for 8/10 with urology. Catheter care, nursing to teach to the patient.  Bladder mass: As seen on the CT imaging. Showed mild right hydronephrosis and hydroureter without obstructing stone,irregular hyperdense filling defect within the posterior bladder which could reflect hematoma or possible mass.  Seen by urology.  As above, plan to discharge home with Foley catheter and follow-up cystoscopy.  Hyponatremia: This is most likely hypovolemic hyponatremia secondary to dehydration.  Sodium has improved with IV fluid.  Currently stable.  Hyperlipidemia: LDL of 157.  Noncompliant.  Does not agree to go back on a statin.  History of coronary artery disease: Status post  CABG.  Patient is super noncompliant.  He has not seen a doctor for last 11 years.  He takes aspirin only as needed.    Alcohol abuse: Drinks 6-8 beers a day.  CIWA protocol started.  Continue thiamine folic acid.  No withdrawal so far.  Tobacco use: Counseled cessation.  Smokes a pack a day.  Nicotine patch.  Obesity:BMI 31.5   Blood cultures positive on IV antibiotics.  Final sensitivity pending.  Still has hematuria but urine is flowing freely.  Anticipate home tomorrow.    DVT prophylaxis:SCDs Start: 11/07/21 2339     Code Status: Full Code  Family Communication: None at bedside  Patient status:Inpatient  Patient is from :Home  Anticipated discharge FI:[facility]  Estimated DC date: Tomorrow.   Consultants: Urology  Procedures:None  Antimicrobials:  Anti-infectives (From admission, onward)    Start     Dose/Rate Route Frequency Ordered Stop   11/09/21 0000  ciprofloxacin (CIPRO) 500 MG tablet        500 mg Oral 2 times daily 11/09/21 0843 11/13/21 2359   11/07/21 2345  cefTRIAXone (ROCEPHIN) 2 g in sodium chloride 0.9 % 100 mL IVPB        2 g 200 mL/hr over 30 Minutes Intravenous Every 24 hours 11/07/21 2338         Subjective:  Patient seen and examined.  No overnight events.  He insists on going home. CBI was turned off yesterday, he still has hematuria but without evidence of blood clots or obstruction. Patient wanted to go at any cost, I showed him positive blood cultures and he agreed to stay back.  Objective: Vitals:   11/08/21 2004 11/08/21 2348 11/09/21  5993 11/09/21 1144  BP: (!) 123/58 117/66 (!) 141/77 119/67  Pulse: 82 62 70 (!) 57  Resp: 16  16 16   Temp: 99 F (37.2 C)  97.8 F (36.6 C) 98.1 F (36.7 C)  TempSrc: Oral  Oral Oral  SpO2: 96%  97% 97%  Weight:   95.3 kg   Height:        Intake/Output Summary (Last 24 hours) at 11/09/2021 1357 Last data filed at 11/09/2021 1143 Gross per 24 hour  Intake 3120 ml  Output 4600 ml  Net -1480 ml    Filed Weights   11/08/21 0456 11/09/21 0624  Weight: 96.9 kg 95.3 kg    Examination:  General: Looks fairly comfortable.  On room air.  Alert oriented x4.  Slightly impulsive. Cardiovascular: S1-S2 normal.  Regular rate rhythm. Respiratory: Bilateral clear.  No added sounds. Gastrointestinal: Soft.  Nontender.  Bowel sound present. Ext: No swelling or edema. Neuro: Intact. Musculoskeletal: No deformities. Skin: Intact. Catheters: Foley catheter with grossly bloody urine.  Free-flowing.    Data Reviewed: I have personally reviewed following labs and imaging studies  CBC: Recent Labs  Lab 11/07/21 1732 11/08/21 0439  WBC 8.7 7.9  NEUTROABS  --  6.2  HGB 15.3 15.0  HCT 43.2 41.9  MCV 103.3* 101.5*  PLT 205 188   Basic Metabolic Panel: Recent Labs  Lab 11/07/21 1732 11/08/21 0439  NA 126* 133*  K 4.2 4.2  CL 93* 98  CO2 22 26  GLUCOSE 106* 106*  BUN <5* <5*  CREATININE 0.70 0.69  CALCIUM 8.6* 8.5*  MG  --  2.2  PHOS  --  3.9     Recent Results (from the past 240 hour(s))  Culture, blood (Routine X 2) w Reflex to ID Panel     Status: Abnormal   Collection Time: 11/08/21 12:21 AM   Specimen: BLOOD RIGHT HAND  Result Value Ref Range Status   Specimen Description BLOOD RIGHT HAND  Final   Special Requests   Final    BOTTLES DRAWN AEROBIC AND ANAEROBIC Blood Culture adequate volume   Culture  Setup Time (A)  Final    GRAM VARIABLE ROD AEROBIC BOTTLE ONLY CRITICAL RESULT CALLED TO, READ BACK BY AND VERIFIED WITH: PHARMD JAMES LEDFORD 11/09/21@00 :32 BY TW    Culture (A)  Final    BACILLUS SPECIES Standardized susceptibility testing for this organism is not available. CRITICAL RESULT CALLED TO, READ BACK BY AND VERIFIED WITH: A. PAYTES PHARMD, AT 1205 11/09/21 D. VANHOOK Performed at [facility], [address], [place], [place]    Report Status 11/09/2021 FINAL  Final  Blood Culture ID Panel (Reflexed)     Status: None   Collection  Time: 11/08/21 12:21 AM  Result Value Ref Range Status   Enterococcus faecalis NOT DETECTED NOT DETECTED Corrected   Enterococcus Faecium NOT DETECTED NOT DETECTED Corrected   Listeria monocytogenes NOT DETECTED NOT DETECTED Corrected   Staphylococcus species NOT DETECTED NOT DETECTED Corrected   Staphylococcus aureus (BCID) NOT DETECTED NOT DETECTED Corrected   Staphylococcus epidermidis NOT DETECTED NOT DETECTED Corrected   Staphylococcus lugdunensis NOT DETECTED NOT DETECTED Corrected   Streptococcus species NOT DETECTED NOT DETECTED Corrected   Streptococcus agalactiae NOT DETECTED NOT DETECTED Corrected   Streptococcus pneumoniae NOT DETECTED NOT DETECTED Corrected   Streptococcus pyogenes NOT DETECTED NOT DETECTED Corrected   A.calcoaceticus-baumannii NOT DETECTED NOT DETECTED Corrected   Bacteroides fragilis NOT DETECTED NOT DETECTED Corrected   Enterobacterales  NOT DETECTED NOT DETECTED Corrected   Enterobacter cloacae complex NOT DETECTED NOT DETECTED Corrected   Escherichia coli NOT DETECTED NOT DETECTED Corrected   Klebsiella aerogenes NOT DETECTED NOT DETECTED Corrected   Klebsiella oxytoca NOT DETECTED NOT DETECTED Corrected   Klebsiella pneumoniae NOT DETECTED NOT DETECTED Corrected   Proteus species NOT DETECTED NOT DETECTED Corrected   Salmonella species NOT DETECTED NOT DETECTED Corrected   Serratia marcescens NOT DETECTED NOT DETECTED Corrected   Haemophilus influenzae NOT DETECTED NOT DETECTED Corrected   Neisseria meningitidis NOT DETECTED NOT DETECTED Corrected   Pseudomonas aeruginosa NOT DETECTED NOT DETECTED Corrected   Stenotrophomonas maltophilia NOT DETECTED NOT DETECTED Corrected   Candida albicans NOT DETECTED NOT DETECTED Corrected   Candida auris NOT DETECTED NOT DETECTED Corrected   Candida glabrata NOT DETECTED NOT DETECTED Corrected   Candida krusei NOT DETECTED NOT DETECTED Corrected   Candida parapsilosis NOT DETECTED NOT DETECTED Corrected    Candida tropicalis NOT DETECTED NOT DETECTED Corrected   Cryptococcus neoformans/gattii NOT DETECTED NOT DETECTED Corrected    Comment: Performed at [facility], [address], [place], [place] [address]  Urine Culture     Status: None   Collection Time: 11/08/21 12:41 AM   Specimen: Urine, Catheterized  Result Value Ref Range Status   Specimen Description URINE, CATHETERIZED  Final   Special Requests NONE  Final   Culture   Final    NO GROWTH Performed at [facility], [address], [place], [place] [address]    Report Status 11/09/2021 FINAL  Final  Culture, blood (Routine X 2) w Reflex to ID Panel     Status: None (Preliminary result)   Collection Time: 11/08/21 12:41 AM   Specimen: BLOOD LEFT HAND  Result Value Ref Range Status   Specimen Description BLOOD LEFT HAND  Final   Special Requests   Final    BOTTLES DRAWN AEROBIC AND ANAEROBIC Blood Culture adequate volume   Culture   Final    NO GROWTH 1 DAY Performed at [facility], [address], [place], [place] [address]    Report Status PENDING  Incomplete     Radiology Studies: DG Chest Portable 1 View  Result Date: 11/07/2021 CLINICAL DATA:  cp EXAM: PORTABLE CHEST 1 VIEW.  Patient is rotated. COMPARISON:  Chest x-ray 12/18/2010 FINDINGS: The heart and mediastinal contours are within normal limits. Surgical changes overlie the mediastinum. Left base hazy airspace opacity. No pulmonary edema. Query trace left pleural effusion. No right pleural effusion. No pneumothorax. No acute osseous abnormality. IMPRESSION: 1. Left base hazy airspace opacity that may represent a combination of infection/inflammation versus atelectasis. Recommend repeat PA and lateral view of the chest further evaluation. 2. Query trace left pleural effusion. Electronically Signed   By: Tish Frederickson M.D.   On: 11/07/2021 23:24   CT ABDOMEN PELVIS W CONTRAST  Result Date: 11/07/2021 CLINICAL DATA:  Hematuria EXAM: CT ABDOMEN AND  PELVIS WITH CONTRAST TECHNIQUE: Multidetector CT imaging of the abdomen and pelvis was performed using the standard protocol following bolus administration of intravenous contrast. RADIATION DOSE REDUCTION: This exam was performed according to the departmental dose-optimization program which includes automated exposure control, adjustment of the mA and/or kV according to patient size and/or use of iterative reconstruction technique. CONTRAST:  OMNIPAQUE IOHEXOL 300 MG/ML  SOLN COMPARISON:  None Available. FINDINGS: Lower chest: Lung bases demonstrate pleural and parenchymal scarring at the left base. Coronary vascular calcification. Hepatobiliary: No focal liver  abnormality is seen. No gallstones, gallbladder wall thickening, or biliary dilatation. Pancreas: Unremarkable. No pancreatic ductal dilatation or surrounding inflammatory changes. Spleen: Normal in size without focal abnormality. Adrenals/Urinary Tract: Adrenal glands are normal. Mild right hydronephrosis and hydroureter. No obstructing stone. 17 mm slightly dense lesion mid pole right kidney without appreciable enhancement on delayed views suggesting hemorrhagic or proteinaceous complicated cyst, no specific follow-up imaging is recommended. Foley catheter in the bladder. Irregular hyperdense filling defect within the posterior bladder, likely the source of obstruction on the right. Moderate urine in the bladder on delayed views. The bladder is slightly thick walled in appearance. Stomach/Bowel: Stomach is within normal limits. Appendix appears normal. No evidence of bowel wall thickening, distention, or inflammatory changes. Diverticular disease of the left colon without acute wall thickening. Vascular/Lymphatic: Nonaneurysmal aorta. Moderate aortic atherosclerosis. No suspicious lymph nodes. Reproductive: Prostate is unremarkable. Other: Negative for pelvic effusion or free air Musculoskeletal: No acute osseous abnormality IMPRESSION: 1. Mild right  hydronephrosis and hydroureter without obstructing stone. Irregular hyperdense filling defect within the posterior bladder which could reflect hematoma or possible mass. Suggest correlation with direct visualization. 2. Diverticular disease of the left colon without acute inflammatory process. Electronically Signed   By: Jasmine Pang M.D.   On: 11/07/2021 22:51    Scheduled Meds:  Chlorhexidine Gluconate Cloth  6 each Topical Q0600   folic acid  1 mg Oral Daily   LORazepam  0-4 mg Intravenous Q6H   Followed by   Melene Muller ON 11/10/2021] LORazepam  0-4 mg Intravenous Q12H   multivitamin with minerals  1 tablet Oral Daily   thiamine  100 mg Oral Daily   Or   thiamine  100 mg Intravenous Daily   Continuous Infusions:  cefTRIAXone (ROCEPHIN)  IV 2 g (11/08/21 2348)   sodium chloride Stopped (11/07/21 2006)   sodium chloride irrigation       LOS: 2 days   Dorcas Carrow, MD [facility] P7/28/2023, 1:57 PM

## 2021-11-09 NOTE — Care Management Important Message (Signed)
Important Message  Patient Details  Name: Noah Rosales MRN: [id number] Date of Birth: Sep 18, 1960   Medicare Important Message Given:  Yes     [place], [place] 11/09/2021, 9:28 AM

## 2021-11-09 NOTE — Progress Notes (Signed)
PHARMACY - PHYSICIAN COMMUNICATION CRITICAL VALUE ALERT - BLOOD CULTURE IDENTIFICATION (BCID)  Assessment:61 YOM with h/o nephrolithiasis presenting with sx of UTI.  Suspected source(s): Urinary   Culture Result(s): UPDATE: Originally read as 1/4 GNRs, now read as 1/4 gram variable rods, possibly bacillus spp.   BCID Result(s): Nothing detected on BCID    Name of physician (or Provider) Contacted: Dorcas Carrow, MD   Changes to prescribed antibiotics recommended:  No changes recommended at this time- remain on ceftriaxone 2g IV Q24H   Jani Gravel, PharmD PGY-2 Infectious Diseases Resident  11/09/2021 12:05 PM

## 2021-11-09 NOTE — Progress Notes (Signed)
PHARMACY - PHYSICIAN COMMUNICATION CRITICAL VALUE ALERT - BLOOD CULTURE IDENTIFICATION (BCID)  Noah Rosales is an 61 y.o. male who presented to [facility] on 11/07/2021 with a chief complaint of hematuria  7/27 Blood >>>1/4 GNR (no organism detected on rapid culture)  Name of physician (or Provider) Contacted: Dr. Antionette Char  Current antibiotics: Ceftriaxone 2g IV q24h  Changes to prescribed antibiotics recommended:  No changes for now F/U standard culture   Results for orders placed or performed during the hospital encounter of 11/07/21  Blood Culture ID Panel (Reflexed) (Collected: 11/08/2021 12:21 AM)  Result Value Ref Range   Enterococcus faecalis NOT DETECTED NOT DETECTED   Enterococcus Faecium NOT DETECTED NOT DETECTED   Listeria monocytogenes NOT DETECTED NOT DETECTED   Staphylococcus species NOT DETECTED NOT DETECTED   Staphylococcus aureus (BCID) NOT DETECTED NOT DETECTED   Staphylococcus epidermidis NOT DETECTED NOT DETECTED   Staphylococcus lugdunensis NOT DETECTED NOT DETECTED   Streptococcus species NOT DETECTED NOT DETECTED   Streptococcus agalactiae NOT DETECTED NOT DETECTED   Streptococcus pneumoniae NOT DETECTED NOT DETECTED   Streptococcus pyogenes NOT DETECTED NOT DETECTED   A.calcoaceticus-baumannii NOT DETECTED NOT DETECTED   Bacteroides fragilis NOT DETECTED NOT DETECTED   Enterobacterales NOT DETECTED NOT DETECTED   Enterobacter cloacae complex NOT DETECTED NOT DETECTED   Escherichia coli NOT DETECTED NOT DETECTED   Klebsiella aerogenes NOT DETECTED NOT DETECTED   Klebsiella oxytoca NOT DETECTED NOT DETECTED   Klebsiella pneumoniae NOT DETECTED NOT DETECTED   Proteus species NOT DETECTED NOT DETECTED   Salmonella species NOT DETECTED NOT DETECTED   Serratia marcescens NOT DETECTED NOT DETECTED   Haemophilus influenzae NOT DETECTED NOT DETECTED   Neisseria meningitidis NOT DETECTED NOT DETECTED   Pseudomonas aeruginosa NOT DETECTED NOT DETECTED    Stenotrophomonas maltophilia NOT DETECTED NOT DETECTED   Candida albicans NOT DETECTED NOT DETECTED   Candida auris NOT DETECTED NOT DETECTED   Candida glabrata NOT DETECTED NOT DETECTED   Candida krusei NOT DETECTED NOT DETECTED   Candida parapsilosis NOT DETECTED NOT DETECTED   Candida tropicalis NOT DETECTED NOT DETECTED   Cryptococcus neoformans/gattii NOT DETECTED NOT DETECTED    Abran Duke 11/09/2021  2:35 AM

## 2021-11-10 DIAGNOSIS — R310 Gross hematuria: Secondary | ICD-10-CM | POA: Diagnosis not present

## 2021-11-10 LAB — URINALYSIS, ROUTINE W REFLEX MICROSCOPIC
Bilirubin Urine: NEGATIVE
Glucose, UA: 100 mg/dL — AB
Ketones, ur: NEGATIVE mg/dL
Nitrite: POSITIVE — AB
Protein, ur: >300 mg/dL — AB
Specific Gravity, Urine: 1.02 (ref 1.005–1.030)
pH: 7 (ref 5.0–8.0)

## 2021-11-10 LAB — URINALYSIS, MICROSCOPIC (REFLEX)
RBC / HPF: >50 RBC/hpf (ref 0–5)
Squamous Epithelial / HPF: NONE SEEN (ref 0–5)
WBC, UA: >50 WBC/hpf (ref 0–5)

## 2021-11-10 LAB — BASIC METABOLIC PANEL
Anion gap: 6 (ref 5–15)
BUN: <5 mg/dL — ABNORMAL LOW (ref 8–23)
CO2: 28 mmol/L (ref 22–32)
Calcium: 8.1 mg/dL — ABNORMAL LOW (ref 8.9–10.3)
Chloride: 104 mmol/L (ref 98–111)
Creatinine, Ser: 0.77 mg/dL (ref 0.61–1.24)
GFR, Estimated: >60 mL/min (ref >60)
Glucose, Bld: 107 mg/dL — ABNORMAL HIGH (ref 70–99)
Potassium: 3.9 mmol/L (ref 3.5–5.1)
Sodium: 138 mmol/L (ref 135–145)

## 2021-11-10 LAB — CBC
HCT: 36.9 % — ABNORMAL LOW (ref 39.0–52.0)
Hemoglobin: 12.4 g/dL — ABNORMAL LOW (ref 13.0–17.0)
MCH: 36.3 pg — ABNORMAL HIGH (ref 26.0–34.0)
MCHC: 33.6 g/dL (ref 30.0–36.0)
MCV: 107.9 fL — ABNORMAL HIGH (ref 80.0–100.0)
Platelets: 171 10*3/uL (ref 150–400)
RBC: 3.42 MIL/uL — ABNORMAL LOW (ref 4.22–5.81)
RDW: 12.1 % (ref 11.5–15.5)
WBC: 6.4 10*3/uL (ref 4.0–10.5)
nRBC: 0 % (ref 0.0–0.2)

## 2021-11-10 NOTE — Discharge Summary (Signed)
Physician Discharge Summary   Patient: Noah Rosales MRN: [id number] DOB: [birth year]  Admit date:     11/07/2021  Discharge date: 11/10/21  Discharge Physician: Barnetta Chapel   PCP: Pcp, No   Recommendations at discharge:   Follow-up with urology team on discharge.  Urology team has cleared patient for discharge. Follow-up primary care provider within 1 week of discharge.  Discharge Diagnoses: Principal Problem:   Gross hematuria Active Problems:   CAD (coronary artery disease)   Alcohol abuse   Hyponatremia   Bladder mass   Hospital Course: Patient is a 61 year old male with history of coronary artery disease status post CABG, GERD, nephrolithiasis, noncompliance, current alcohol use/smoking who presented here with complaints of gross hematuria for 3 days along with dysuria.  CT imaging done in the emergency department showed possible mass in the bladder and right sided hydronephrosis.  Urology team was consulted.  Urology team inserted Foley catheter and continuous bladder irrigation was initiated.  Urology team is cleared patient for discharge, and patient will be discharged with Foley catheter in situ.  Patient will follow-up with urology team on discharge.  On presentation, sodium level was 126.  Sodium level was 138 prior to discharge.  Assessment and Plan: Gross hematuria/pyuria:  -Patient was having hematuria for 3 days.   -UA was positive for pyuria, red blood cells.  Patient takes aspirin at home which is on hold.  -Urine culture did not grow any organisms.   -Blood cultures, 1 out of 4 bottles with gram variable rods, likely contaminant.   -Patient was initially managed with IV ceftriaxone due to preliminary positive blood culture.   -Initially treated with continuous bladder irrigation, still has hematuria but freely flowing.   -Urology team is cleared patient for discharge home with Foley catheter -Patient will follow with urology team on 11/22/2021.   Bladder  mass:  -CT imaging revealed mild right hydronephrosis and hydroureter without obstructing stone,irregular hyperdense filling defect within the posterior bladder which could reflect hematoma or possible mass.   -Management as per urology team.   -The plan, as per urology team, is to discharge home with Foley catheter and follow-up cystoscopy.   Hyponatremia:  -Resolved.     Hyperlipidemia: LDL of 157.  Noncompliant.  Does not agree to go back on a statin.   History of coronary artery disease: Status post CABG.  Patient is super noncompliant.  He has not seen a doctor for last 11 years.  He takes aspirin only as needed.     Alcohol abuse: Drinks 6-8 beers a day.  CIWA protocol started.  Continue thiamine folic acid.  No withdrawal so far.   Tobacco use: Counseled cessation.  Smokes a pack a day.  Nicotine patch.   Obesity:BMI 31.5   Consultants: Urology Procedures performed: Foley catheter insertion, irrigation continuous bladder irrigation. Disposition: Home Diet recommendation:  Discharge Diet Orders (From admission, onward)     Start     Ordered   11/09/21 0000  Diet - low sodium heart healthy        11/09/21 0843           Cardiac diet DISCHARGE MEDICATION: Allergies as of 11/10/2021       Reactions   Sertraline Hcl Rash        Medication List     STOP taking these medications    aspirin EC 81 MG tablet       TAKE these medications    ciprofloxacin 500 MG tablet Commonly  known as: Cipro Take 1 tablet (500 mg total) by mouth 2 (two) times daily for 7 doses.        Follow-up Information     MacDiarmid, Lorin Picket, MD. Schedule an appointment as soon as possible for a visit in 12 day(s).   Specialty: Urology Why: Appoitment made for August 10th at 9:30am Contact information: [address] [place] [address] [phone]                Discharge Exam: [name] Weights   11/08/21 0456 11/09/21 0624  Weight: 96.9 kg 95.3 kg     Condition  at discharge: stable  The results of significant diagnostics from this hospitalization (including imaging, microbiology, ancillary and laboratory) are listed below for reference.   Imaging Studies: DG Chest Portable 1 View  Result Date: 11/07/2021 CLINICAL DATA:  cp EXAM: PORTABLE CHEST 1 VIEW.  Patient is rotated. COMPARISON:  Chest x-ray 12/18/2010 FINDINGS: The heart and mediastinal contours are within normal limits. Surgical changes overlie the mediastinum. Left base hazy airspace opacity. No pulmonary edema. Query trace left pleural effusion. No right pleural effusion. No pneumothorax. No acute osseous abnormality. IMPRESSION: 1. Left base hazy airspace opacity that may represent a combination of infection/inflammation versus atelectasis. Recommend repeat PA and lateral view of the chest further evaluation. 2. Query trace left pleural effusion. Electronically Signed   By: Tish Frederickson M.D.   On: 11/07/2021 23:24   CT ABDOMEN PELVIS W CONTRAST  Result Date: 11/07/2021 CLINICAL DATA:  Hematuria EXAM: CT ABDOMEN AND PELVIS WITH CONTRAST TECHNIQUE: Multidetector CT imaging of the abdomen and pelvis was performed using the standard protocol following bolus administration of intravenous contrast. RADIATION DOSE REDUCTION: This exam was performed according to the departmental dose-optimization program which includes automated exposure control, adjustment of the mA and/or kV according to patient size and/or use of iterative reconstruction technique. CONTRAST:  OMNIPAQUE IOHEXOL 300 MG/ML  SOLN COMPARISON:  None Available. FINDINGS: Lower chest: Lung bases demonstrate pleural and parenchymal scarring at the left base. Coronary vascular calcification. Hepatobiliary: No focal liver abnormality is seen. No gallstones, gallbladder wall thickening, or biliary dilatation. Pancreas: Unremarkable. No pancreatic ductal dilatation or surrounding inflammatory changes. Spleen: Normal in size without focal  abnormality. Adrenals/Urinary Tract: Adrenal glands are normal. Mild right hydronephrosis and hydroureter. No obstructing stone. 17 mm slightly dense lesion mid pole right kidney without appreciable enhancement on delayed views suggesting hemorrhagic or proteinaceous complicated cyst, no specific follow-up imaging is recommended. Foley catheter in the bladder. Irregular hyperdense filling defect within the posterior bladder, likely the source of obstruction on the right. Moderate urine in the bladder on delayed views. The bladder is slightly thick walled in appearance. Stomach/Bowel: Stomach is within normal limits. Appendix appears normal. No evidence of bowel wall thickening, distention, or inflammatory changes. Diverticular disease of the left colon without acute wall thickening. Vascular/Lymphatic: Nonaneurysmal aorta. Moderate aortic atherosclerosis. No suspicious lymph nodes. Reproductive: Prostate is unremarkable. Other: Negative for pelvic effusion or free air Musculoskeletal: No acute osseous abnormality IMPRESSION: 1. Mild right hydronephrosis and hydroureter without obstructing stone. Irregular hyperdense filling defect within the posterior bladder which could reflect hematoma or possible mass. Suggest correlation with direct visualization. 2. Diverticular disease of the left colon without acute inflammatory process. Electronically Signed   By: Jasmine Pang M.D.   On: 11/07/2021 22:51    Microbiology: Results for orders placed or performed during the hospital encounter of 11/07/21  Culture, blood (Routine X 2) w Reflex to ID  Panel     Status: Abnormal   Collection Time: 11/08/21 12:21 AM   Specimen: BLOOD RIGHT HAND  Result Value Ref Range Status   Specimen Description BLOOD RIGHT HAND  Final   Special Requests   Final    BOTTLES DRAWN AEROBIC AND ANAEROBIC Blood Culture adequate volume   Culture  Setup Time (A)  Final    GRAM VARIABLE ROD AEROBIC BOTTLE ONLY CRITICAL RESULT CALLED TO, READ  BACK BY AND VERIFIED WITH: PHARMD JAMES LEDFORD 11/09/21@00 :32 BY TW    Culture (A)  Final    BACILLUS SPECIES Standardized susceptibility testing for this organism is not available. CRITICAL RESULT CALLED TO, READ BACK BY AND VERIFIED WITH: A. PAYTES PHARMD, AT 1205 11/09/21 D. VANHOOK Performed at [facility], [address] [place], [place] [address]    Report Status 11/09/2021 FINAL  Final  Blood Culture ID Panel (Reflexed)     Status: None   Collection Time: 11/08/21 12:21 AM  Result Value Ref Range Status   Enterococcus faecalis NOT DETECTED NOT DETECTED Corrected   Enterococcus Faecium NOT DETECTED NOT DETECTED Corrected   Listeria monocytogenes NOT DETECTED NOT DETECTED Corrected   Staphylococcus species NOT DETECTED NOT DETECTED Corrected   Staphylococcus aureus (BCID) NOT DETECTED NOT DETECTED Corrected   Staphylococcus epidermidis NOT DETECTED NOT DETECTED Corrected   Staphylococcus lugdunensis NOT DETECTED NOT DETECTED Corrected   Streptococcus species NOT DETECTED NOT DETECTED Corrected   Streptococcus agalactiae NOT DETECTED NOT DETECTED Corrected   Streptococcus pneumoniae NOT DETECTED NOT DETECTED Corrected   Streptococcus pyogenes NOT DETECTED NOT DETECTED Corrected   A.calcoaceticus-baumannii NOT DETECTED NOT DETECTED Corrected   Bacteroides fragilis NOT DETECTED NOT DETECTED Corrected   Enterobacterales NOT DETECTED NOT DETECTED Corrected   Enterobacter cloacae complex NOT DETECTED NOT DETECTED Corrected   Escherichia coli NOT DETECTED NOT DETECTED Corrected   Klebsiella aerogenes NOT DETECTED NOT DETECTED Corrected   Klebsiella oxytoca NOT DETECTED NOT DETECTED Corrected   Klebsiella pneumoniae NOT DETECTED NOT DETECTED Corrected   Proteus species NOT DETECTED NOT DETECTED Corrected   Salmonella species NOT DETECTED NOT DETECTED Corrected   Serratia marcescens NOT DETECTED NOT DETECTED Corrected   Haemophilus influenzae NOT DETECTED NOT DETECTED Corrected    Neisseria meningitidis NOT DETECTED NOT DETECTED Corrected   Pseudomonas aeruginosa NOT DETECTED NOT DETECTED Corrected   Stenotrophomonas maltophilia NOT DETECTED NOT DETECTED Corrected   Candida albicans NOT DETECTED NOT DETECTED Corrected   Candida auris NOT DETECTED NOT DETECTED Corrected   Candida glabrata NOT DETECTED NOT DETECTED Corrected   Candida krusei NOT DETECTED NOT DETECTED Corrected   Candida parapsilosis NOT DETECTED NOT DETECTED Corrected   Candida tropicalis NOT DETECTED NOT DETECTED Corrected   Cryptococcus neoformans/gattii NOT DETECTED NOT DETECTED Corrected    Comment: Performed at [facility], [address] [place], [place] [address]  Urine Culture     Status: None   Collection Time: 11/08/21 12:41 AM   Specimen: Urine, Catheterized  Result Value Ref Range Status   Specimen Description URINE, CATHETERIZED  Final   Special Requests NONE  Final   Culture   Final    NO GROWTH Performed at [facility], [address], [place], [place] [address]    Report Status 11/09/2021 FINAL  Final  Culture, blood (Routine X 2) w Reflex to ID Panel     Status: None (Preliminary result)   Collection Time: 11/08/21 12:41 AM   Specimen: BLOOD LEFT HAND  Result Value  Ref Range Status   Specimen Description BLOOD LEFT HAND  Final   Special Requests   Final    BOTTLES DRAWN AEROBIC AND ANAEROBIC Blood Culture adequate volume   Culture   Final    NO GROWTH 2 DAYS Performed at [facility], [address], [place], [place] [address]    Report Status PENDING  Incomplete    Labs: CBC: Recent Labs  Lab 11/07/21 1732 11/08/21 0439 11/10/21 0116  WBC 8.7 7.9 6.4  NEUTROABS  --  6.2  --   HGB 15.3 15.0 12.4*  HCT 43.2 41.9 36.9*  MCV 103.3* 101.5* 107.9*  PLT 205 188 171   Basic Metabolic Panel: Recent Labs  Lab 11/07/21 1732 11/08/21 0439 11/10/21 0116  NA 126* 133* 138  K 4.2 4.2 3.9  CL 93* 98 104  CO2 22 26 28   GLUCOSE 106* 106* 107*   BUN <5* <5* <5*  CREATININE 0.70 0.69 0.77  CALCIUM 8.6* 8.5* 8.1*  MG  --  2.2  --   PHOS  --  3.9  --    Liver Function Tests: Recent Labs  Lab 11/07/21 1732 11/08/21 0439  AST 48* 40  ALT 40 37  ALKPHOS 87 77  BILITOT 1.2 1.6*  PROT 6.9 6.6  ALBUMIN 3.6 3.5   CBG: No results for input(s): "GLUCAP" in the last 168 hours.  Discharge time spent: greater than 30 minutes.  Signed: 11/10/21, MD [facility] 11/10/2021

## 2021-11-10 NOTE — Progress Notes (Signed)
Received referral to assist pt with [facility] RN. Met with pt and girlfriend. He plans to return home with the support of his girlfriend. He doesn't have a preference for a [place] agency. Discussed CMS Medicare compare list. He agreed to use [facility]. Contacted Georgina Snell with [facility] and he accepted the referral. Informed Georgina Snell that pt needs [facility] RN starting tomorrow.

## 2021-11-10 NOTE — Progress Notes (Signed)
Foley bag with 600 ml blood tinged urine draining well. No s/s blood clots or obstruction of urinary catheter. Okay for discharge to home. HH RN arranged per Case Mgr to visit tomorrow to assess foley catheter and drainage. Current drain bag removed and leg bag applied for transport home. Addl standard drain bag provided with care instruction for foley catheter at home. Pt and girl friend verbalized understanding of care and maintenance as well as follow up with urology. Okay to d/c home with 3 way foley cath per Dr Marlou Porch.

## 2021-11-13 LAB — CULTURE, BLOOD (ROUTINE X 2)
Culture: NO GROWTH
Special Requests: ADEQUATE

## 2021-11-22 ENCOUNTER — Emergency Department (HOSPITAL_COMMUNITY)
Admission: EM | Admit: 2021-11-22 | Discharge: 2021-11-23 | Disposition: A | Discharge status: Home / Self Care | Payer: Medicare HMO | Attending: Emergency Medicine | Admitting: Emergency Medicine

## 2021-11-22 ENCOUNTER — Other Ambulatory Visit: Payer: Self-pay

## 2021-11-22 ENCOUNTER — Encounter (HOSPITAL_COMMUNITY): Payer: Self-pay | Admitting: Emergency Medicine

## 2021-11-22 DIAGNOSIS — Z466 Encounter for fitting and adjustment of urinary device: Secondary | ICD-10-CM | POA: Diagnosis not present

## 2021-11-22 DIAGNOSIS — Z5321 Procedure and treatment not carried out due to patient leaving prior to being seen by health care provider: Secondary | ICD-10-CM | POA: Insufficient documentation

## 2021-11-22 NOTE — ED Notes (Signed)
Foley catheter removed by triage RN , patient expressed relief.

## 2021-11-22 NOTE — ED Triage Notes (Signed)
Patient requesting urinary catheter remove , patient stated pain / blood clots for several days .

## 2021-11-22 NOTE — ED Provider Triage Note (Signed)
Emergency Medicine Provider Triage Evaluation Note  Noah Rosales , a 61 y.o. male  was evaluated in triage.  Pt complains of painful foley catheter. Had catheter placed at [facility] for blood clots in urine and urinary retention. Was supposed to go to urologist today and was going to request to have it removed but missed his appointment. Hasn't had blood clots in urine x 2 days.   Review of Systems  Positive: Penile pain Negative: Abdominal pain, vomiting, hematuria  Physical Exam  BP 135/78 (BP Location: Left Arm)   Pulse 77   Temp 98 F (36.7 C) (Oral)   Resp 16   SpO2 95%  Gen:   Awake, no distress   Resp:  Normal effort  MSK:   Moves extremities without difficulty  Other:    Medical Decision Making  Medically screening exam initiated at 9:33 PM.  Appropriate orders placed.  Noah Rosales was informed that the remainder of the evaluation will be completed by another provider, this initial triage assessment does not replace that evaluation, and the importance of remaining in the ED until their evaluation is complete.     Alucard Fearnow T, PA-C 11/22/21 2135

## 2021-11-23 NOTE — ED Notes (Signed)
Patient called x2 for vitals recheck with no respose

## 2022-09-08 ENCOUNTER — Encounter (HOSPITAL_COMMUNITY): Payer: Self-pay

## 2022-09-08 ENCOUNTER — Emergency Department (HOSPITAL_COMMUNITY): Payer: Medicare HMO

## 2022-09-08 ENCOUNTER — Inpatient Hospital Stay (HOSPITAL_COMMUNITY)
Admission: EM | Admit: 2022-09-08 | Discharge: 2022-09-13 | DRG: 653 | Disposition: A | Discharge status: Home Health | Payer: Medicare HMO | Attending: Internal Medicine | Admitting: Internal Medicine

## 2022-09-08 ENCOUNTER — Other Ambulatory Visit: Payer: Self-pay

## 2022-09-08 DIAGNOSIS — Z8551 Personal history of malignant neoplasm of bladder: Secondary | ICD-10-CM | POA: Diagnosis not present

## 2022-09-08 DIAGNOSIS — F1010 Alcohol abuse, uncomplicated: Secondary | ICD-10-CM | POA: Diagnosis not present

## 2022-09-08 DIAGNOSIS — K219 Gastro-esophageal reflux disease without esophagitis: Secondary | ICD-10-CM | POA: Diagnosis present

## 2022-09-08 DIAGNOSIS — F17200 Nicotine dependence, unspecified, uncomplicated: Secondary | ICD-10-CM | POA: Diagnosis present

## 2022-09-08 DIAGNOSIS — N2889 Other specified disorders of kidney and ureter: Secondary | ICD-10-CM

## 2022-09-08 DIAGNOSIS — J90 Pleural effusion, not elsewhere classified: Secondary | ICD-10-CM | POA: Diagnosis not present

## 2022-09-08 DIAGNOSIS — E871 Hypo-osmolality and hyponatremia: Secondary | ICD-10-CM | POA: Diagnosis present

## 2022-09-08 DIAGNOSIS — Z955 Presence of coronary angioplasty implant and graft: Secondary | ICD-10-CM

## 2022-09-08 DIAGNOSIS — F17210 Nicotine dependence, cigarettes, uncomplicated: Secondary | ICD-10-CM | POA: Diagnosis not present

## 2022-09-08 DIAGNOSIS — Z6831 Body mass index (BMI) 31.0-31.9, adult: Secondary | ICD-10-CM

## 2022-09-08 DIAGNOSIS — Z951 Presence of aortocoronary bypass graft: Secondary | ICD-10-CM

## 2022-09-08 DIAGNOSIS — E876 Hypokalemia: Secondary | ICD-10-CM | POA: Diagnosis present

## 2022-09-08 DIAGNOSIS — F10239 Alcohol dependence with withdrawal, unspecified: Secondary | ICD-10-CM | POA: Diagnosis present

## 2022-09-08 DIAGNOSIS — B9562 Methicillin resistant Staphylococcus aureus infection as the cause of diseases classified elsewhere: Secondary | ICD-10-CM | POA: Diagnosis present

## 2022-09-08 DIAGNOSIS — E669 Obesity, unspecified: Secondary | ICD-10-CM | POA: Diagnosis present

## 2022-09-08 DIAGNOSIS — Z888 Allergy status to other drugs, medicaments and biological substances status: Secondary | ICD-10-CM

## 2022-09-08 DIAGNOSIS — C679 Malignant neoplasm of bladder, unspecified: Principal | ICD-10-CM | POA: Diagnosis present

## 2022-09-08 DIAGNOSIS — S3729XA Other injury of bladder, initial encounter: Secondary | ICD-10-CM | POA: Diagnosis not present

## 2022-09-08 DIAGNOSIS — I10 Essential (primary) hypertension: Secondary | ICD-10-CM | POA: Diagnosis not present

## 2022-09-08 DIAGNOSIS — R9431 Abnormal electrocardiogram [ECG] [EKG]: Secondary | ICD-10-CM | POA: Diagnosis not present

## 2022-09-08 DIAGNOSIS — D494 Neoplasm of unspecified behavior of bladder: Secondary | ICD-10-CM | POA: Diagnosis not present

## 2022-09-08 DIAGNOSIS — N136 Pyonephrosis: Secondary | ICD-10-CM | POA: Diagnosis present

## 2022-09-08 DIAGNOSIS — D62 Acute posthemorrhagic anemia: Secondary | ICD-10-CM | POA: Diagnosis present

## 2022-09-08 DIAGNOSIS — Z8249 Family history of ischemic heart disease and other diseases of the circulatory system: Secondary | ICD-10-CM

## 2022-09-08 DIAGNOSIS — N200 Calculus of kidney: Secondary | ICD-10-CM | POA: Diagnosis not present

## 2022-09-08 DIAGNOSIS — I252 Old myocardial infarction: Secondary | ICD-10-CM | POA: Diagnosis not present

## 2022-09-08 DIAGNOSIS — D630 Anemia in neoplastic disease: Secondary | ICD-10-CM | POA: Diagnosis not present

## 2022-09-08 DIAGNOSIS — N320 Bladder-neck obstruction: Secondary | ICD-10-CM | POA: Diagnosis present

## 2022-09-08 DIAGNOSIS — R338 Other retention of urine: Principal | ICD-10-CM

## 2022-09-08 DIAGNOSIS — I2510 Atherosclerotic heart disease of native coronary artery without angina pectoris: Secondary | ICD-10-CM | POA: Diagnosis present

## 2022-09-08 DIAGNOSIS — N390 Urinary tract infection, site not specified: Secondary | ICD-10-CM

## 2022-09-08 DIAGNOSIS — N3289 Other specified disorders of bladder: Secondary | ICD-10-CM | POA: Diagnosis present

## 2022-09-08 DIAGNOSIS — G9341 Metabolic encephalopathy: Secondary | ICD-10-CM | POA: Diagnosis present

## 2022-09-08 DIAGNOSIS — Z452 Encounter for adjustment and management of vascular access device: Secondary | ICD-10-CM | POA: Diagnosis not present

## 2022-09-08 DIAGNOSIS — R310 Gross hematuria: Secondary | ICD-10-CM | POA: Diagnosis present

## 2022-09-08 DIAGNOSIS — R319 Hematuria, unspecified: Secondary | ICD-10-CM | POA: Diagnosis present

## 2022-09-08 DIAGNOSIS — R58 Hemorrhage, not elsewhere classified: Secondary | ICD-10-CM | POA: Diagnosis not present

## 2022-09-08 DIAGNOSIS — N1330 Unspecified hydronephrosis: Secondary | ICD-10-CM | POA: Diagnosis not present

## 2022-09-08 DIAGNOSIS — N131 Hydronephrosis with ureteral stricture, not elsewhere classified: Secondary | ICD-10-CM | POA: Diagnosis not present

## 2022-09-08 LAB — URINALYSIS, ROUTINE W REFLEX MICROSCOPIC

## 2022-09-08 LAB — COMPREHENSIVE METABOLIC PANEL
ALT: 33 U/L (ref 0–44)
AST: 46 U/L — ABNORMAL HIGH (ref 15–41)
Albumin: 3.1 g/dL — ABNORMAL LOW (ref 3.5–5.0)
Alkaline Phosphatase: 83 U/L (ref 38–126)
Anion gap: 12 (ref 5–15)
BUN: 7 mg/dL — ABNORMAL LOW (ref 8–23)
CO2: 20 mmol/L — ABNORMAL LOW (ref 22–32)
Calcium: 7.7 mg/dL — ABNORMAL LOW (ref 8.9–10.3)
Chloride: 94 mmol/L — ABNORMAL LOW (ref 98–111)
Creatinine, Ser: 0.83 mg/dL (ref 0.61–1.24)
GFR, Estimated: >60 mL/min (ref >60)
Glucose, Bld: 103 mg/dL — ABNORMAL HIGH (ref 70–99)
Potassium: 4.1 mmol/L (ref 3.5–5.1)
Sodium: 126 mmol/L — ABNORMAL LOW (ref 135–145)
Total Bilirubin: 1.2 mg/dL (ref 0.3–1.2)
Total Protein: 6 g/dL — ABNORMAL LOW (ref 6.5–8.1)

## 2022-09-08 LAB — CBC WITH DIFFERENTIAL/PLATELET
Abs Immature Granulocytes: 0.03 10*3/uL (ref 0.00–0.07)
Basophils Absolute: 0 10*3/uL (ref 0.0–0.1)
Basophils Relative: 0 %
Eosinophils Absolute: 0 10*3/uL (ref 0.0–0.5)
Eosinophils Relative: 0 %
HCT: 28.6 % — ABNORMAL LOW (ref 39.0–52.0)
Hemoglobin: 9 g/dL — ABNORMAL LOW (ref 13.0–17.0)
Immature Granulocytes: 0 %
Lymphocytes Relative: 12 %
Lymphs Abs: 0.9 10*3/uL (ref 0.7–4.0)
MCH: 30.9 pg (ref 26.0–34.0)
MCHC: 31.5 g/dL (ref 30.0–36.0)
MCV: 98.3 fL (ref 80.0–100.0)
Monocytes Absolute: 0.5 10*3/uL (ref 0.1–1.0)
Monocytes Relative: 6 %
Neutro Abs: 6.1 10*3/uL (ref 1.7–7.7)
Neutrophils Relative %: 82 %
Platelets: 112 10*3/uL — ABNORMAL LOW (ref 150–400)
RBC: 2.91 MIL/uL — ABNORMAL LOW (ref 4.22–5.81)
RDW: 13.4 % (ref 11.5–15.5)
WBC: 7.6 10*3/uL (ref 4.0–10.5)
nRBC: 0 % (ref 0.0–0.2)

## 2022-09-08 LAB — URINALYSIS, MICROSCOPIC (REFLEX)
RBC / HPF: >50 RBC/hpf (ref 0–5)
WBC, UA: >50 WBC/hpf (ref 0–5)

## 2022-09-08 MED ORDER — THIAMINE HCL 100 MG/ML IJ SOLN
100.0000 mg | Freq: Every day | INTRAMUSCULAR | Status: DC
  Administered 2022-09-10 – 2022-09-12 (×2): 100 mg via INTRAVENOUS
  Filled 2022-09-08 (×2): qty 2

## 2022-09-08 MED ORDER — FOLIC ACID 1 MG PO TABS
1.0000 mg | ORAL_TABLET | Freq: Every day | ORAL | Status: DC
  Administered 2022-09-10 – 2022-09-13 (×4): 1 mg via ORAL
  Filled 2022-09-08 (×5): qty 1

## 2022-09-08 MED ORDER — MORPHINE SULFATE (PF) 2 MG/ML IV SOLN
2.0000 mg | INTRAVENOUS | Status: DC | PRN
  Administered 2022-09-09 (×2): 4 mg via INTRAVENOUS
  Administered 2022-09-10 – 2022-09-12 (×5): 2 mg via INTRAVENOUS
  Filled 2022-09-08: qty 1
  Filled 2022-09-08: qty 2
  Filled 2022-09-08 (×4): qty 1
  Filled 2022-09-08: qty 2

## 2022-09-08 MED ORDER — ACETAMINOPHEN 325 MG PO TABS
650.0000 mg | ORAL_TABLET | Freq: Four times a day (QID) | ORAL | Status: DC | PRN
  Administered 2022-09-09 – 2022-09-13 (×2): 650 mg via ORAL
  Filled 2022-09-08 (×2): qty 2

## 2022-09-08 MED ORDER — LORAZEPAM 2 MG/ML IJ SOLN
1.0000 mg | INTRAMUSCULAR | Status: AC | PRN
  Administered 2022-09-09: 4 mg via INTRAVENOUS
  Administered 2022-09-09: 3 mg via INTRAVENOUS
  Administered 2022-09-10: 1 mg via INTRAVENOUS
  Filled 2022-09-08 (×2): qty 2
  Filled 2022-09-08: qty 1

## 2022-09-08 MED ORDER — SODIUM CHLORIDE 0.9 % IV SOLN
1.0000 g | INTRAVENOUS | Status: DC
  Administered 2022-09-09: 1 g via INTRAVENOUS
  Filled 2022-09-08 (×2): qty 10

## 2022-09-08 MED ORDER — ACETAMINOPHEN 650 MG RE SUPP: 650.0000 mg | Freq: Four times a day (QID) | RECTAL | Status: DC | PRN

## 2022-09-08 MED ORDER — ONDANSETRON HCL 4 MG/2ML IJ SOLN: 4.0000 mg | Freq: Four times a day (QID) | INTRAMUSCULAR | Status: DC | PRN

## 2022-09-08 MED ORDER — SODIUM CHLORIDE 0.9 % IV SOLN
1.0000 g | Freq: Once | INTRAVENOUS | Status: AC
  Administered 2022-09-08: 1 g via INTRAVENOUS
  Filled 2022-09-08: qty 10

## 2022-09-08 MED ORDER — MORPHINE SULFATE (PF) 4 MG/ML IV SOLN
4.0000 mg | Freq: Once | INTRAVENOUS | Status: AC
  Administered 2022-09-08: 4 mg via INTRAVENOUS
  Filled 2022-09-08: qty 1

## 2022-09-08 MED ORDER — THIAMINE MONONITRATE 100 MG PO TABS
100.0000 mg | ORAL_TABLET | Freq: Every day | ORAL | Status: DC
  Administered 2022-09-11 – 2022-09-13 (×2): 100 mg via ORAL
  Filled 2022-09-08 (×3): qty 1

## 2022-09-08 MED ORDER — ONDANSETRON HCL 4 MG PO TABS: 4.0000 mg | ORAL_TABLET | Freq: Four times a day (QID) | ORAL | Status: DC | PRN

## 2022-09-08 MED ORDER — SODIUM CHLORIDE 0.9 % IR SOLN
3000.0000 mL | Status: DC
  Administered 2022-09-08 (×4): 3000 mL

## 2022-09-08 MED ORDER — LORAZEPAM 1 MG PO TABS
1.0000 mg | ORAL_TABLET | ORAL | Status: AC | PRN
  Administered 2022-09-11: 1 mg via ORAL
  Filled 2022-09-08: qty 1

## 2022-09-08 MED ORDER — SODIUM CHLORIDE 0.9 % IV SOLN: INTRAVENOUS | Status: DC

## 2022-09-08 MED ORDER — ADULT MULTIVITAMIN W/MINERALS CH
1.0000 | ORAL_TABLET | Freq: Every day | ORAL | Status: DC
  Administered 2022-09-10 – 2022-09-13 (×4): 1 via ORAL
  Filled 2022-09-08 (×5): qty 1

## 2022-09-08 NOTE — Assessment & Plan Note (Addendum)
On patients chart from July 2023 admission for hematuria.  Per chart at that time: "-CT imaging revealed mild right hydronephrosis and hydroureter without obstructing stone,irregular hyperdense filling defect within the posterior bladder which could reflect hematoma or possible mass.   -Management as per urology team.   -The plan, as per urology team, is to discharge home with Foley catheter and follow-up cystoscopy."  Not clear if he had cystoscopy done in follow up or not. Urology to see pt in AM, dw Dr. Liliane Shi as above.

## 2022-09-08 NOTE — ED Triage Notes (Signed)
Pt woke around 0700 with flank, back, and groin pain. Passed 10-12 decent sized stones at home (EMS witnessed) w/ about of bloody urine. Feels like he's been retaining urine since passing the stones. Pt decided to self-medicate w/ six 12oz beers. Given of Fentanyl in route to hospital

## 2022-09-08 NOTE — ED Notes (Signed)
ED TO INPATIENT HANDOFF REPORT  ED Nurse Name and Phone #: Juliette Alcide RN [phone]  S Name/Age/Gender Noah Rosales 62 y.o. male Room/Bed: 030C/030C  Code Status   Code Status: Full Code  Home/SNF/Other Home Patient oriented to: self, place, time, and situation Is this baseline? Yes   Triage Complete: Triage complete  Chief Complaint Gross hematuria [R31.0]  Triage Note Pt woke around 0700 with flank, back, and groin pain. Passed 10-12 decent sized stones at home (EMS witnessed) w/ about of bloody urine. Feels like he's been retaining urine since passing the stones. Pt decided to self-medicate w/ six 12oz beers. Given of Fentanyl in route to hospital   Allergies Allergies  Allergen Reactions   Sertraline Hcl Rash    Zoloft    Level of Care/Admitting Diagnosis ED Disposition     ED Disposition  Admit   Condition  --   Comment  Hospital Area: [facility] [[id number]]  Level of Care: Telemetry Medical [104]  May place patient in observation at [facility] or [facility] if equivalent level of care is available:: Yes  Covid Evaluation: Asymptomatic - no recent exposure (last 10 days) testing not required  Diagnosis: Gross hematuria [599.71.ICD-9-CM]  Admitting Physician: Hillary Bow [[id number]]  Attending Physician: Hillary Bow [phone]          B Medical/Surgery History Past Medical History:  Diagnosis Date   Alcohol abuse    CAD (coronary artery disease)    GERD (gastroesophageal reflux disease)    Myocardial infarction (HCC) 2005   s/p stent to right coronary artery   Past Surgical History:  Procedure Laterality Date   Exploration of mediastinum, extracorporeal circulation,  11/23/10   Dr Laneta Simmers   s/p cagb x 4  11/22/10   Dr Laneta Simmers     A IV Location/Drains/Wounds Patient Lines/Drains/Airways Status     Active Line/Drains/Airways     Name Placement date Placement time Site Days   Peripheral IV 09/08/22 20 G  Left;Posterior Hand 09/08/22  1601  Hand  less than 1   Urethral Catheter Rollen Sox, RN Triple-lumen 20 Fr. 09/08/22  1715  Triple-lumen  less than 1   Wound / Incision (Open or Dehisced) 11/08/21 Irritant Dermatitis (Moisture Associated Skin Damage) Pelvis Anterior;Left;Right Groin 11/08/21  0333  Pelvis  304            Intake/Output Last 24 hours  Intake/Output Summary (Last 24 hours) at 09/08/2022 2320 Last data filed at 09/08/2022 2218 Gross per 24 hour  Intake 100 ml  Output 81191 ml  Net -10200 ml    Labs/Imaging Results for orders placed or performed during the hospital encounter of 09/08/22 (from the past 48 hour(s))  Urinalysis, Routine w reflex microscopic -Urine, Clean Catch     Status: Abnormal   Collection Time: 09/08/22  4:11 PM  Result Value Ref Range   Color, Urine RED (A) YELLOW    Comment: BIOCHEMICALS MAY BE AFFECTED BY COLOR   APPearance TURBID (A) CLEAR   Specific Gravity, Urine  1.005 - 1.030    TEST NOT REPORTED DUE TO COLOR INTERFERENCE OF URINE PIGMENT   pH  5.0 - 8.0    TEST NOT REPORTED DUE TO COLOR INTERFERENCE OF URINE PIGMENT   Glucose, UA (A) NEGATIVE mg/dL    TEST NOT REPORTED DUE TO COLOR INTERFERENCE OF URINE PIGMENT   Hgb urine dipstick (A) NEGATIVE    TEST NOT REPORTED DUE TO COLOR INTERFERENCE OF URINE PIGMENT  Bilirubin Urine (A) NEGATIVE    TEST NOT REPORTED DUE TO COLOR INTERFERENCE OF URINE PIGMENT   Ketones, ur (A) NEGATIVE mg/dL    TEST NOT REPORTED DUE TO COLOR INTERFERENCE OF URINE PIGMENT   Protein, ur (A) NEGATIVE mg/dL    TEST NOT REPORTED DUE TO COLOR INTERFERENCE OF URINE PIGMENT   Nitrite (A) NEGATIVE    TEST NOT REPORTED DUE TO COLOR INTERFERENCE OF URINE PIGMENT   Leukocytes,Ua (A) NEGATIVE    TEST NOT REPORTED DUE TO COLOR INTERFERENCE OF URINE PIGMENT    Comment: Performed at [facility], [address], [place], [place] [address]  Urinalysis, Microscopic (reflex)     Status: Abnormal   Collection  Time: 09/08/22  4:11 PM  Result Value Ref Range   RBC / HPF >50 0 - 5 RBC/hpf   WBC, UA >50 0 - 5 WBC/hpf   Bacteria, UA MANY (A) NONE SEEN   Squamous Epithelial / HPF 0-5 0 - 5 /HPF   Mucus PRESENT     Comment: Performed at [facility], [address], [place], [place] [address]  CBC with Differential     Status: Abnormal   Collection Time: 09/08/22  4:50 PM  Result Value Ref Range   WBC 7.6 4.0 - 10.5 K/uL   RBC 2.91 (L) 4.22 - 5.81 MIL/uL   Hemoglobin 9.0 (L) 13.0 - 17.0 g/dL   HCT 09.8 (L) 11.9 - 14.7 %   MCV 98.3 80.0 - 100.0 fL   MCH 30.9 26.0 - 34.0 pg   MCHC 31.5 30.0 - 36.0 g/dL   RDW 82.9 56.2 - 13.0 %   Platelets 112 (L) 150 - 400 K/uL    Comment: REPEATED TO VERIFY   nRBC 0.0 0.0 - 0.2 %   Neutrophils Relative % 82 %   Neutro Abs 6.1 1.7 - 7.7 K/uL   Lymphocytes Relative 12 %   Lymphs Abs 0.9 0.7 - 4.0 K/uL   Monocytes Relative 6 %   Monocytes Absolute 0.5 0.1 - 1.0 K/uL   Eosinophils Relative 0 %   Eosinophils Absolute 0.0 0.0 - 0.5 K/uL   Basophils Relative 0 %   Basophils Absolute 0.0 0.0 - 0.1 K/uL   Immature Granulocytes 0 %   Abs Immature Granulocytes 0.03 0.00 - 0.07 K/uL    Comment: Performed at [facility], [address], [place], [place] [address]  Comprehensive metabolic panel     Status: Abnormal   Collection Time: 09/08/22  4:50 PM  Result Value Ref Range   Sodium 126 (L) 135 - 145 mmol/L   Potassium 4.1 3.5 - 5.1 mmol/L   Chloride 94 (L) 98 - 111 mmol/L   CO2 20 (L) 22 - 32 mmol/L   Glucose, Bld 103 (H) 70 - 99 mg/dL    Comment: Glucose reference range applies only to samples taken after fasting for at least 8 hours.   BUN 7 (L) 8 - 23 mg/dL   Creatinine, Ser 4.69 0.61 - 1.24 mg/dL   Calcium 7.7 (L) 8.9 - 10.3 mg/dL   Total Protein 6.0 (L) 6.5 - 8.1 g/dL   Albumin 3.1 (L) 3.5 - 5.0 g/dL   AST 46 (H) 15 - 41 U/L   ALT 33 0 - 44 U/L   Alkaline Phosphatase 83 38 - 126 U/L   Total Bilirubin 1.2 0.3 - 1.2 mg/dL   GFR, Estimated  >62 >95 mL/min    Comment: (NOTE) Calculated using  the CKD-EPI Creatinine Equation (2021)    Anion gap 12 5 - 15    Comment: Performed at [facility], [address] [place], [place] [address]   CT Renal Stone Study  Result Date: 09/08/2022 CLINICAL DATA:  Abdominal pain. EXAM: CT ABDOMEN AND PELVIS WITHOUT CONTRAST TECHNIQUE: Multidetector CT imaging of the abdomen and pelvis was performed following the standard protocol without IV contrast. RADIATION DOSE REDUCTION: This exam was performed according to the departmental dose-optimization program which includes automated exposure control, adjustment of the mA and/or kV according to patient size and/or use of iterative reconstruction technique. COMPARISON:  November 07, 2021 FINDINGS: Lower chest: Small left pleural effusion. Normal heart size calcific atherosclerotic disease of the coronary arteries and aorta. Hepatobiliary: No focal liver abnormality is seen. Status post cholecystectomy. No biliary dilatation. Pancreas: Unremarkable. No pancreatic ductal dilatation or surrounding inflammatory changes. Spleen: Normal in size without focal abnormality. Adrenals/Urinary Tract: Normal adrenal glands. Normal left kidney and left ureter. 1.8 cm indeterminate right renal mass, exophytic off of the posterior pole of the right kidney. Marked right hydronephrosis and hydroureter to the level of the vesicoureteral junction. The urinary bladder is markedly distended and contains what appears to be multiple blood clots. There are also gas bubbles within the urinary bladder. Urinary Foley has been placed. Stomach/Bowel: Stomach is within normal limits. No evidence of appendicitis. No evidence of bowel wall thickening, distention, or inflammatory changes. Vascular/Lymphatic: Aortic atherosclerosis. No enlarged abdominal or pelvic lymph nodes. Reproductive: Mildly enlarged prostate gland. Other: No abdominal wall hernia or abnormality. No abdominopelvic ascites.  Musculoskeletal: No acute or significant osseous findings. IMPRESSION: 1. Marked right hydronephrosis and hydroureter to the level of the vesicoureteral junction. The urinary bladder is markedly distended, to 21.9 cm, and contains what appears to be multiple blood clots. There are also gas bubbles within the urinary bladder. Urinary Foley has been placed. 2. 1.8 cm indeterminate right renal mass, exophytic off of the posterior pole of the right kidney. Further evaluation with renal protocol CT or MRI may be considered, when clinically appropriate. 3. Small left pleural effusion. 4. Aortic atherosclerosis. Aortic Atherosclerosis (ICD10-I70.0). Electronically Signed   By: Ted Mcalpine M.D.   On: 09/08/2022 17:53    Pending Labs Unresulted Labs (From admission, onward)     Start     Ordered   09/09/22 0500  CBC  Tomorrow morning,   R        09/08/22 2312   09/09/22 0500  Basic metabolic panel  Tomorrow morning,   R        09/08/22 2312   09/08/22 1842  Urine Culture  Once,   URGENT       Question:  Indication  Answer:  Acute gross hematuria   09/08/22 1841            Vitals/Pain Today's Vitals   09/08/22 1812 09/08/22 2100 09/08/22 2155 09/08/22 2215  BP:  (!) 185/84  (!) 155/80  Pulse:  (!) 103  89  Resp:  20  (!) 22  Temp:   99.1 F (37.3 C)   TempSrc:   Oral   SpO2:  99%  97%  Weight:      Height:      PainSc: 0-No pain       Isolation Precautions No active isolations  Medications Medications  sodium chloride irrigation 0.9 % 3,000 mL (3,000 mLs Irrigation New Bag/Given 09/08/22 2232)  LORazepam (ATIVAN) tablet 1-4 mg (has no administration in time  range)    Or  LORazepam (ATIVAN) injection 1-4 mg (has no administration in time range)  thiamine (VITAMIN B1) tablet 100 mg (has no administration in time range)    Or  thiamine (VITAMIN B1) injection 100 mg (has no administration in time range)  folic acid (FOLVITE) tablet 1 mg (has no administration in time range)   multivitamin with minerals tablet 1 tablet (has no administration in time range)  0.9 %  sodium chloride infusion (has no administration in time range)  cefTRIAXone (ROCEPHIN) 1 g in sodium chloride 0.9 % 100 mL IVPB (has no administration in time range)  acetaminophen (TYLENOL) tablet 650 mg (has no administration in time range)    Or  acetaminophen (TYLENOL) suppository 650 mg (has no administration in time range)  ondansetron (ZOFRAN) tablet 4 mg (has no administration in time range)    Or  ondansetron (ZOFRAN) injection 4 mg (has no administration in time range)  morphine (PF) 2 MG/ML injection 2-4 mg (has no administration in time range)  morphine (PF) 4 MG/ML injection 4 mg (4 mg Intravenous Given 09/08/22 1726)  cefTRIAXone (ROCEPHIN) 1 g in sodium chloride 0.9 % 100 mL IVPB (0 g Intravenous Stopped 09/08/22 1940)  morphine (PF) 4 MG/ML injection 4 mg (4 mg Intravenous Given 09/08/22 2153)    Mobility walks     Focused Assessments See GU assessment  R Recommendations: See Admitting Provider Note  Report given to:   Additional Notes: Pt A&Ox4. CBI infusing.

## 2022-09-08 NOTE — Assessment & Plan Note (Signed)
CIWA 

## 2022-09-08 NOTE — Assessment & Plan Note (Addendum)
Probably water intoxication from beer potomania, vs dehydration. Was present on admit last year as well and resolved with treatment in hospital. Will put on NS at 100cc/hr overnight Repeat BMP in AM Further work up if not improving, ie SIADH rule out (not sure that we can get an accurate urine sodium and osm at the moment though given amount of gross hematuria and ongoing CBI with NS).

## 2022-09-08 NOTE — ED Provider Notes (Signed)
[place] EMERGENCY DEPARTMENT AT [facility] Provider Note   CSN: [id number] Arrival date & time: 09/08/22  1559     History  Chief Complaint  Patient presents with   Flank Pain   Hematuria    Noah Rosales is a 62 y.o. male.  The history is provided by the patient and medical records. No language interpreter was used.  Flank Pain  Hematuria     62 year old male significant history of alcohol use, CAD, GERD, kidney stone, bladder mass, presenting with complaints of hematuria and flank pain.  Patient developed acute onset of bilateral flank pain and urinary discomfort that started this morning.  States he noticed many kidney stones when he was trying to urinate follows with blood in his urine.  Now he mentioned that he has urge to urinate but unable to assess feel like he is having urinary retention.  His pain is moderate in severity.  States he may have passed at least 20 kidney stones.  He admits to regular alcohol use drinking at least 8 beers daily.  Did drink multiple alcoholic beverages throughout the day today to try to help pass the stone.  He has had kidney stones in the past.  He denies any fever or chills no nausea or vomiting.  Home Medications Prior to Admission medications   Not on File      Allergies    Sertraline hcl    Review of Systems   Review of Systems  Genitourinary:  Positive for flank pain and hematuria.  All other systems reviewed and are negative.   Physical Exam Updated Vital Signs BP (!) 154/105 (BP Location: Right Arm)   Pulse 99   Temp 99 F (37.2 C) (Oral)   Resp 18   Ht 5\' 8"  (1.727 m)   Wt 95.3 kg   SpO2 98%   BMI 31.93 kg/m  Physical Exam Vitals and nursing note reviewed.  Constitutional:      General: He is not in acute distress.    Appearance: He is well-developed. He is obese.  HENT:     Head: Atraumatic.  Eyes:     Conjunctiva/sclera: Conjunctivae normal.  Cardiovascular:     Rate and Rhythm: Normal rate  and regular rhythm.     Pulses: Normal pulses.     Heart sounds: Normal heart sounds.  Pulmonary:     Effort: Pulmonary effort is normal.     Breath sounds: Normal breath sounds.  Abdominal:     General: There is distension.     Tenderness: There is abdominal tenderness. There is no right CVA tenderness, left CVA tenderness, guarding or rebound.  Genitourinary:    Comments: Chaperone present during exam.  Circumcised penis with blood noted at the urethral meatus but no obvious foreign body noted.  Testicle with normal lie, normal scrotum, no inguinal lymphadenopathy or inguinal hernia noted. Musculoskeletal:     Cervical back: Neck supple.  Skin:    Findings: No rash.  Neurological:     Mental Status: He is alert.     ED Results / Procedures / Treatments   Labs (all labs ordered are listed, but only abnormal results are displayed) Labs Reviewed  CBC WITH DIFFERENTIAL/PLATELET - Abnormal; Notable for the following components:      Result Value   RBC 2.91 (*)    Hemoglobin 9.0 (*)    HCT 28.6 (*)    Platelets 112 (*)    All other components within normal limits  COMPREHENSIVE  METABOLIC PANEL - Abnormal; Notable for the following components:   Sodium 126 (*)    Chloride 94 (*)    CO2 20 (*)    Glucose, Bld 103 (*)    BUN 7 (*)    Calcium 7.7 (*)    Total Protein 6.0 (*)    Albumin 3.1 (*)    AST 46 (*)    All other components within normal limits  URINALYSIS, ROUTINE W REFLEX MICROSCOPIC - Abnormal; Notable for the following components:   Color, Urine RED (*)    APPearance TURBID (*)    Glucose, UA   (*)    Value: TEST NOT REPORTED DUE TO COLOR INTERFERENCE OF URINE PIGMENT   Hgb urine dipstick   (*)    Value: TEST NOT REPORTED DUE TO COLOR INTERFERENCE OF URINE PIGMENT   Bilirubin Urine   (*)    Value: TEST NOT REPORTED DUE TO COLOR INTERFERENCE OF URINE PIGMENT   Ketones, ur   (*)    Value: TEST NOT REPORTED DUE TO COLOR INTERFERENCE OF URINE PIGMENT   Protein, ur    (*)    Value: TEST NOT REPORTED DUE TO COLOR INTERFERENCE OF URINE PIGMENT   Nitrite   (*)    Value: TEST NOT REPORTED DUE TO COLOR INTERFERENCE OF URINE PIGMENT   Leukocytes,Ua   (*)    Value: TEST NOT REPORTED DUE TO COLOR INTERFERENCE OF URINE PIGMENT   All other components within normal limits  URINALYSIS, MICROSCOPIC (REFLEX) - Abnormal; Notable for the following components:   Bacteria, UA MANY (*)    All other components within normal limits  URINE CULTURE    EKG None  Radiology CT Renal Stone Study  Result Date: 09/08/2022 CLINICAL DATA:  Abdominal pain. EXAM: CT ABDOMEN AND PELVIS WITHOUT CONTRAST TECHNIQUE: Multidetector CT imaging of the abdomen and pelvis was performed following the standard protocol without IV contrast. RADIATION DOSE REDUCTION: This exam was performed according to the departmental dose-optimization program which includes automated exposure control, adjustment of the mA and/or kV according to patient size and/or use of iterative reconstruction technique. COMPARISON:  November 07, 2021 FINDINGS: Lower chest: Small left pleural effusion. Normal heart size calcific atherosclerotic disease of the coronary arteries and aorta. Hepatobiliary: No focal liver abnormality is seen. Status post cholecystectomy. No biliary dilatation. Pancreas: Unremarkable. No pancreatic ductal dilatation or surrounding inflammatory changes. Spleen: Normal in size without focal abnormality. Adrenals/Urinary Tract: Normal adrenal glands. Normal left kidney and left ureter. 1.8 cm indeterminate right renal mass, exophytic off of the posterior pole of the right kidney. Marked right hydronephrosis and hydroureter to the level of the vesicoureteral junction. The urinary bladder is markedly distended and contains what appears to be multiple blood clots. There are also gas bubbles within the urinary bladder. Urinary Foley has been placed. Stomach/Bowel: Stomach is within normal limits. No evidence of  appendicitis. No evidence of bowel wall thickening, distention, or inflammatory changes. Vascular/Lymphatic: Aortic atherosclerosis. No enlarged abdominal or pelvic lymph nodes. Reproductive: Mildly enlarged prostate gland. Other: No abdominal wall hernia or abnormality. No abdominopelvic ascites. Musculoskeletal: No acute or significant osseous findings. IMPRESSION: 1. Marked right hydronephrosis and hydroureter to the level of the vesicoureteral junction. The urinary bladder is markedly distended, to 21.9 cm, and contains what appears to be multiple blood clots. There are also gas bubbles within the urinary bladder. Urinary Foley has been placed. 2. 1.8 cm indeterminate right renal mass, exophytic off of the posterior pole of the right kidney.  Further evaluation with renal protocol CT or MRI may be considered, when clinically appropriate. 3. Small left pleural effusion. 4. Aortic atherosclerosis. Aortic Atherosclerosis (ICD10-I70.0). Electronically Signed   By: Ted Mcalpine M.D.   On: 09/08/2022 17:53    Procedures Procedures    Medications Ordered in ED Medications  sodium chloride irrigation 0.9 % 3,000 mL (3,000 mLs Irrigation New Bag/Given 09/08/22 2232)  morphine (PF) 4 MG/ML injection 4 mg (4 mg Intravenous Given 09/08/22 1726)  cefTRIAXone (ROCEPHIN) 1 g in sodium chloride 0.9 % 100 mL IVPB (0 g Intravenous Stopped 09/08/22 1940)  morphine (PF) 4 MG/ML injection 4 mg (4 mg Intravenous Given 09/08/22 2153)    ED Course/ Medical Decision Making/ A&P                             Medical Decision Making Amount and/or Complexity of Data Reviewed Labs: ordered. Radiology: ordered.  Risk Prescription drug management.   BP (!) 154/105 (BP Location: Right Arm)   Pulse 99   Temp 99 F (37.2 C) (Oral)   Resp 18   Ht 5\' 8"  (1.727 m)   Wt 95.3 kg   SpO2 98%   BMI 31.93 kg/m   75:48 PM  62 year old male significant history of alcohol use, CAD, GERD, kidney stone, bladder mass,  presenting with complaints of hematuria and flank pain.  Patient developed acute onset of bilateral flank pain and urinary discomfort that started this morning.  States he noticed many kidney stones when he was trying to urinate follows with blood in his urine.  Now he mentioned that he has urge to urinate but unable to assess feel like he is having urinary retention.  His pain is moderate in severity.  States he may have passed at least 20 kidney stones.  He admits to regular alcohol use drinking at least 8 beers daily.  Did drink multiple alcoholic beverages throughout the day today to try to help pass the stone.  He has had kidney stones in the past.  He denies any fever or chills no nausea or vomiting.  On exam this is a patient appears older than stated age.  He appears slightly uncomfortable but nontoxic.  Heart with normal rate and rhythm, lungs clear to auscultation bilaterally abdomen is distended with tenderness to suprapubic region.  No CVA tenderness.  On genital exam patient does have some blood noted at the urethral meatus but no foreign body noted.  Vital signs notable for elevated blood pressure 154/105.  Oral temperature is 99 and he is not hypoxic.  EMR reviewed patient has had similar presentation a year ago with a CT scan that shows mild hydronephrosis but no evidence of kidney stones at that time.  There was some hyperdense changes in his bladder suggestive of either hematoma versus bladder mass.  -Labs ordered, independently viewed and interpreted by me.  Labs remarkable for Hgb 9.0, which is a drop from prior.  Na+ 126, UA shows many bacteria but urine was obscured by blood.  Rocephin started.  -The patient was maintained on a cardiac monitor.  I personally viewed and interpreted the cardiac monitored which showed an underlying rhythm of: NSR -Imaging independently viewed and interpreted by me and I agree with radiologist's interpretation.  Result remarkable for abd/pelvis CT showing  marked right hydronephrosis and a very distended bladder.  A right renal mass were noted.   -This patient presents to the ED for concern of  urinary retention, this involves an extensive number of treatment options, and is a complaint that carries with it a high risk of complications and morbidity.  The differential diagnosis includes malignancy, obstructive kidney stone, pyelo, UTI, cystitis, bladder outlet obstruction, medication induced -Co morbidities that complicate the patient evaluation includes alcohol abuse, CAD -Treatment includes bladder irrigation, rocephin -Reevaluation of the patient after these medicines showed that the patient improved -PCP office notes or outside notes reviewed -Discussion with specialist [facility] Dr. Julian Reil who agrees to admit pt -Escalation to admission/observation considered: patient is agreeable with admission.           Final Clinical Impression(s) / ED Diagnoses Final diagnoses:  Acute urinary retention  Gross hematuria  Right kidney mass  Lower urinary tract infectious disease    Rx / DC Orders ED Discharge Orders     None         Fayrene Helper, PA-C 09/08/22 2241    Benjiman Core, MD 09/08/22 [phone]

## 2022-09-08 NOTE — Assessment & Plan Note (Addendum)
With clots and R hydronephrosis and hydroureter.  Large amount of residual clot in bladder likely causing / contributing to hydroureter. Also has renal mass on CT today And h/o possible bladder mass (see below). D/W Dr. Liliane Shi: Vigorous bladder irrigation CBI They will see in consult in AM Repeat CBC in AM Tele monitor Morphine PRN pain Tm 99 Check UCx Empiric rocephin for the moment Dont see any calculus causing obstruction / kidney stones despite pt reported history (suspect those "stones" he reports passing today are actually blood clots)

## 2022-09-08 NOTE — H&P (Signed)
History and Physical    Patient: Noah Rosales:[id number] DOB: 1960/06/21 DOA: 09/08/2022 DOS: the patient was seen and examined on 09/08/2022 PCP: Pcp, No  Patient coming from: Home  Chief Complaint:  Chief Complaint  Patient presents with   Flank Pain   Hematuria   HPI: Noah Rosales is a 62 y.o. male with medical history significant of EtOH abuse, CAD s/p CABG.  Pt in to ED with c/o gross hematuria and flank pain.  Patient developed acute onset of bilateral flank pain and urinary discomfort that started this morning. States he noticed many kidney stones when he was trying to urinate follows with blood in his urine. Now he mentioned that he has urge to urinate but unable to assess feel like he is having urinary retention. His pain is moderate in severity. States he may have passed at least 20 kidney stones.   He admits to regular alcohol use drinking at least 8 beers daily. Did drink multiple alcoholic beverages throughout the day today to try to help pass the stone. He has had kidney stones in the past. He denies any fever or chills no nausea or vomiting.    Review of Systems: As mentioned in the history of present illness. All other systems reviewed and are negative. Past Medical History:  Diagnosis Date   Alcohol abuse    CAD (coronary artery disease)    GERD (gastroesophageal reflux disease)    Myocardial infarction [facility]) 2005   s/p stent to right coronary artery   Past Surgical History:  Procedure Laterality Date   Exploration of mediastinum, extracorporeal circulation,  11/23/10   Dr Laneta Simmers   s/p cagb x 4  11/22/10   Dr Laneta Simmers   Social History:  reports that he has been smoking. He does not have any smokeless tobacco history on file. He reports current alcohol use. No history on file for drug use.  Allergies  Allergen Reactions   Sertraline Hcl Rash    Zoloft    Family History  Problem Relation Age of Onset   Heart disease Other     Prior to Admission  medications   Not on File    Physical Exam: Vitals:   09/08/22 1800 09/08/22 2100 09/08/22 2155 09/08/22 2215  BP: (!) 148/76 (!) 185/84  (!) 155/80  Pulse: 85 (!) 103  89  Resp: (!) 24 20  (!) 22  Temp:   99.1 F (37.3 C)   TempSrc:   Oral   SpO2: 98% 99%  97%  Weight:      Height:       Constitutional: NAD, calm, comfortable, obese Respiratory: clear to auscultation bilaterally, no wheezing, no crackles. Normal respiratory effort. No accessory muscle use.  Cardiovascular: Regular rate and rhythm, no murmurs / rubs / gallops. No extremity edema. 2+ pedal pulses. No carotid bruits.  Abdomen: suprapubic TTP Neurologic: CN 2-12 grossly intact. Sensation intact, DTR normal. Strength 5/5 in all 4.  Psychiatric: Normal judgment and insight. Alert and oriented x 3. Appears mild-moderately intoxicated  Data Reviewed:    Labs on Admission: I have personally reviewed following labs and imaging studies  CBC: Recent Labs  Lab 09/08/22 1650  WBC 7.6  NEUTROABS 6.1  HGB 9.0*  HCT 28.6*  MCV 98.3  PLT 112*   Basic Metabolic Panel: Recent Labs  Lab 09/08/22 1650  NA 126*  K 4.1  CL 94*  CO2 20*  GLUCOSE 103*  BUN 7*  CREATININE 0.83  CALCIUM 7.7*  GFR: Estimated Creatinine Clearance: 103.4 mL/min (by C-G formula based on SCr of 0.83 mg/dL). Liver Function Tests: Recent Labs  Lab 09/08/22 1650  AST 46*  ALT 33  ALKPHOS 83  BILITOT 1.2  PROT 6.0*  ALBUMIN 3.1*   No results for input(s): "LIPASE", "AMYLASE" in the last 168 hours. No results for input(s): "AMMONIA" in the last 168 hours.  Urine analysis:    Component Value Date/Time   COLORURINE RED (A) 09/08/2022 1611   APPEARANCEUR TURBID (A) 09/08/2022 1611   LABSPEC  09/08/2022 1611    TEST NOT REPORTED DUE TO COLOR INTERFERENCE OF URINE PIGMENT   PHURINE  09/08/2022 1611    TEST NOT REPORTED DUE TO COLOR INTERFERENCE OF URINE PIGMENT   GLUCOSEU (A) 09/08/2022 1611    TEST NOT REPORTED DUE TO COLOR  INTERFERENCE OF URINE PIGMENT   HGBUR (A) 09/08/2022 1611    TEST NOT REPORTED DUE TO COLOR INTERFERENCE OF URINE PIGMENT   BILIRUBINUR (A) 09/08/2022 1611    TEST NOT REPORTED DUE TO COLOR INTERFERENCE OF URINE PIGMENT   KETONESUR (A) 09/08/2022 1611    TEST NOT REPORTED DUE TO COLOR INTERFERENCE OF URINE PIGMENT   PROTEINUR (A) 09/08/2022 1611    TEST NOT REPORTED DUE TO COLOR INTERFERENCE OF URINE PIGMENT   UROBILINOGEN 1.0 11/20/2010 1346   NITRITE (A) 09/08/2022 1611    TEST NOT REPORTED DUE TO COLOR INTERFERENCE OF URINE PIGMENT   LEUKOCYTESUR (A) 09/08/2022 1611    TEST NOT REPORTED DUE TO COLOR INTERFERENCE OF URINE PIGMENT    Radiological Exams on Admission: CT Renal Stone Study  Result Date: 09/08/2022 CLINICAL DATA:  Abdominal pain. EXAM: CT ABDOMEN AND PELVIS WITHOUT CONTRAST TECHNIQUE: Multidetector CT imaging of the abdomen and pelvis was performed following the standard protocol without IV contrast. RADIATION DOSE REDUCTION: This exam was performed according to the departmental dose-optimization program which includes automated exposure control, adjustment of the mA and/or kV according to patient size and/or use of iterative reconstruction technique. COMPARISON:  November 07, 2021 FINDINGS: Lower chest: Small left pleural effusion. Normal heart size calcific atherosclerotic disease of the coronary arteries and aorta. Hepatobiliary: No focal liver abnormality is seen. Status post cholecystectomy. No biliary dilatation. Pancreas: Unremarkable. No pancreatic ductal dilatation or surrounding inflammatory changes. Spleen: Normal in size without focal abnormality. Adrenals/Urinary Tract: Normal adrenal glands. Normal left kidney and left ureter. 1.8 cm indeterminate right renal mass, exophytic off of the posterior pole of the right kidney. Marked right hydronephrosis and hydroureter to the level of the vesicoureteral junction. The urinary bladder is markedly distended and contains what  appears to be multiple blood clots. There are also gas bubbles within the urinary bladder. Urinary Foley has been placed. Stomach/Bowel: Stomach is within normal limits. No evidence of appendicitis. No evidence of bowel wall thickening, distention, or inflammatory changes. Vascular/Lymphatic: Aortic atherosclerosis. No enlarged abdominal or pelvic lymph nodes. Reproductive: Mildly enlarged prostate gland. Other: No abdominal wall hernia or abnormality. No abdominopelvic ascites. Musculoskeletal: No acute or significant osseous findings. IMPRESSION: 1. Marked right hydronephrosis and hydroureter to the level of the vesicoureteral junction. The urinary bladder is markedly distended, to 21.9 cm, and contains what appears to be multiple blood clots. There are also gas bubbles within the urinary bladder. Urinary Foley has been placed. 2. 1.8 cm indeterminate right renal mass, exophytic off of the posterior pole of the right kidney. Further evaluation with renal protocol CT or MRI may be considered, when clinically appropriate. 3. Small left  pleural effusion. 4. Aortic atherosclerosis. Aortic Atherosclerosis (ICD10-I70.0). Electronically Signed   By: Ted Mcalpine M.D.   On: 09/08/2022 17:53      Assessment and Plan: * Gross hematuria With clots and R hydronephrosis and hydroureter.  Large amount of residual clot in bladder likely causing / contributing to hydroureter. Also has renal mass on CT today And h/o possible bladder mass (see below). D/W Dr. Liliane Shi: Vigorous bladder irrigation CBI They will see in consult in AM Repeat CBC in AM Tele monitor Morphine PRN pain Tm 99 Check UCx Empiric rocephin for the moment Dont see any calculus causing obstruction / kidney stones despite pt reported history (suspect those "stones" he reports passing today are actually blood clots)  Bladder mass On patients chart from July 2023 admission for hematuria.  Per chart at that time: "-CT imaging revealed mild  right hydronephrosis and hydroureter without obstructing stone,irregular hyperdense filling defect within the posterior bladder which could reflect hematoma or possible mass.   -Management as per urology team.   -The plan, as per urology team, is to discharge home with Foley catheter and follow-up cystoscopy."  Not clear if he had cystoscopy done in follow up or not. Urology to see pt in AM, dw Dr. Liliane Shi as above.  Hyponatremia Probably water intoxication from beer potomania, vs dehydration. Was present on admit last year as well and resolved with treatment in hospital. Will put on NS at 100cc/hr overnight Repeat BMP in AM Further work up if not improving, ie SIADH rule out (not sure that we can get an accurate urine sodium and osm at the moment though given amount of gross hematuria and ongoing CBI with NS).  Alcohol abuse CIWA      Advance Care Planning:   Code Status: Full Code Default, pt with EtOH on board  Consults: D/w Dr. Liliane Shi as above  Family Communication: No family in room  Severity of Illness: The appropriate patient status for this patient is OBSERVATION. Observation status is judged to be reasonable and necessary in order to provide the required intensity of service to ensure the patient's safety. The patient's presenting symptoms, physical exam findings, and initial radiographic and laboratory data in the context of their medical condition is felt to place them at decreased risk for further clinical deterioration. Furthermore, it is anticipated that the patient will be medically stable for discharge from the hospital within 2 midnights of admission.   Author: Hillary Bow., DO 09/08/2022 11:12 PM  For on call review [email or web address].

## 2022-09-09 ENCOUNTER — Observation Stay (HOSPITAL_COMMUNITY): Payer: Medicare HMO | Admitting: Registered Nurse

## 2022-09-09 ENCOUNTER — Observation Stay (HOSPITAL_COMMUNITY): Payer: Medicare HMO

## 2022-09-09 ENCOUNTER — Observation Stay (HOSPITAL_BASED_OUTPATIENT_CLINIC_OR_DEPARTMENT_OTHER): Payer: Medicare HMO | Admitting: Registered Nurse

## 2022-09-09 ENCOUNTER — Encounter (HOSPITAL_COMMUNITY)
Admission: EM | Disposition: A | Discharge status: Home Health | Payer: Self-pay | Source: Home / Self Care | Attending: Internal Medicine

## 2022-09-09 DIAGNOSIS — I2510 Atherosclerotic heart disease of native coronary artery without angina pectoris: Secondary | ICD-10-CM | POA: Diagnosis present

## 2022-09-09 DIAGNOSIS — C679 Malignant neoplasm of bladder, unspecified: Secondary | ICD-10-CM

## 2022-09-09 DIAGNOSIS — I252 Old myocardial infarction: Secondary | ICD-10-CM | POA: Diagnosis not present

## 2022-09-09 DIAGNOSIS — J90 Pleural effusion, not elsewhere classified: Secondary | ICD-10-CM | POA: Diagnosis not present

## 2022-09-09 DIAGNOSIS — N136 Pyonephrosis: Secondary | ICD-10-CM | POA: Diagnosis present

## 2022-09-09 DIAGNOSIS — Z955 Presence of coronary angioplasty implant and graft: Secondary | ICD-10-CM | POA: Diagnosis not present

## 2022-09-09 DIAGNOSIS — F17200 Nicotine dependence, unspecified, uncomplicated: Secondary | ICD-10-CM | POA: Diagnosis present

## 2022-09-09 DIAGNOSIS — Z6831 Body mass index (BMI) 31.0-31.9, adult: Secondary | ICD-10-CM | POA: Diagnosis not present

## 2022-09-09 DIAGNOSIS — Z8551 Personal history of malignant neoplasm of bladder: Secondary | ICD-10-CM | POA: Diagnosis not present

## 2022-09-09 DIAGNOSIS — Z951 Presence of aortocoronary bypass graft: Secondary | ICD-10-CM | POA: Diagnosis not present

## 2022-09-09 DIAGNOSIS — R310 Gross hematuria: Secondary | ICD-10-CM

## 2022-09-09 DIAGNOSIS — E871 Hypo-osmolality and hyponatremia: Secondary | ICD-10-CM | POA: Diagnosis not present

## 2022-09-09 DIAGNOSIS — D630 Anemia in neoplastic disease: Secondary | ICD-10-CM

## 2022-09-09 DIAGNOSIS — N131 Hydronephrosis with ureteral stricture, not elsewhere classified: Secondary | ICD-10-CM | POA: Diagnosis not present

## 2022-09-09 DIAGNOSIS — Z8249 Family history of ischemic heart disease and other diseases of the circulatory system: Secondary | ICD-10-CM | POA: Diagnosis not present

## 2022-09-09 DIAGNOSIS — G9341 Metabolic encephalopathy: Secondary | ICD-10-CM | POA: Diagnosis present

## 2022-09-09 DIAGNOSIS — N320 Bladder-neck obstruction: Secondary | ICD-10-CM | POA: Diagnosis present

## 2022-09-09 DIAGNOSIS — F17210 Nicotine dependence, cigarettes, uncomplicated: Secondary | ICD-10-CM

## 2022-09-09 DIAGNOSIS — Z888 Allergy status to other drugs, medicaments and biological substances status: Secondary | ICD-10-CM | POA: Diagnosis not present

## 2022-09-09 DIAGNOSIS — R319 Hematuria, unspecified: Secondary | ICD-10-CM | POA: Diagnosis present

## 2022-09-09 DIAGNOSIS — N1330 Unspecified hydronephrosis: Secondary | ICD-10-CM | POA: Diagnosis not present

## 2022-09-09 DIAGNOSIS — N3289 Other specified disorders of bladder: Secondary | ICD-10-CM | POA: Diagnosis present

## 2022-09-09 DIAGNOSIS — S3729XA Other injury of bladder, initial encounter: Secondary | ICD-10-CM | POA: Diagnosis not present

## 2022-09-09 DIAGNOSIS — D494 Neoplasm of unspecified behavior of bladder: Secondary | ICD-10-CM | POA: Diagnosis not present

## 2022-09-09 DIAGNOSIS — E876 Hypokalemia: Secondary | ICD-10-CM | POA: Diagnosis present

## 2022-09-09 DIAGNOSIS — K219 Gastro-esophageal reflux disease without esophagitis: Secondary | ICD-10-CM | POA: Diagnosis present

## 2022-09-09 DIAGNOSIS — B9562 Methicillin resistant Staphylococcus aureus infection as the cause of diseases classified elsewhere: Secondary | ICD-10-CM | POA: Diagnosis present

## 2022-09-09 DIAGNOSIS — N390 Urinary tract infection, site not specified: Secondary | ICD-10-CM | POA: Diagnosis present

## 2022-09-09 DIAGNOSIS — E669 Obesity, unspecified: Secondary | ICD-10-CM | POA: Diagnosis present

## 2022-09-09 DIAGNOSIS — Z452 Encounter for adjustment and management of vascular access device: Secondary | ICD-10-CM | POA: Diagnosis not present

## 2022-09-09 DIAGNOSIS — D62 Acute posthemorrhagic anemia: Secondary | ICD-10-CM | POA: Diagnosis not present

## 2022-09-09 DIAGNOSIS — F10239 Alcohol dependence with withdrawal, unspecified: Secondary | ICD-10-CM | POA: Diagnosis not present

## 2022-09-09 HISTORY — PX: CYSTOSCOPY WITH RETROGRADE PYELOGRAM, URETEROSCOPY AND STENT PLACEMENT: SHX5789

## 2022-09-09 LAB — CBC
HCT: 26.5 % — ABNORMAL LOW (ref 39.0–52.0)
HCT: 23.4 % — ABNORMAL LOW (ref 39.0–52.0)
Hemoglobin: 8.5 g/dL — ABNORMAL LOW (ref 13.0–17.0)
Hemoglobin: 7.4 g/dL — ABNORMAL LOW (ref 13.0–17.0)
MCH: 30.7 pg (ref 26.0–34.0)
MCH: 31 pg (ref 26.0–34.0)
MCHC: 32.1 g/dL (ref 30.0–36.0)
MCHC: 31.6 g/dL (ref 30.0–36.0)
MCV: 95.7 fL (ref 80.0–100.0)
MCV: 97.9 fL (ref 80.0–100.0)
Platelets: 246 10*3/uL (ref 150–400)
Platelets: 181 10*3/uL (ref 150–400)
RBC: 2.77 MIL/uL — ABNORMAL LOW (ref 4.22–5.81)
RBC: 2.39 MIL/uL — ABNORMAL LOW (ref 4.22–5.81)
RDW: 13.2 % (ref 11.5–15.5)
RDW: 13.5 % (ref 11.5–15.5)
WBC: 14.5 10*3/uL — ABNORMAL HIGH (ref 4.0–10.5)
WBC: 12.9 10*3/uL — ABNORMAL HIGH (ref 4.0–10.5)
nRBC: 0 % (ref 0.0–0.2)
nRBC: 0 % (ref 0.0–0.2)

## 2022-09-09 LAB — BASIC METABOLIC PANEL
Anion gap: 14 (ref 5–15)
BUN: 8 mg/dL (ref 8–23)
CO2: 20 mmol/L — ABNORMAL LOW (ref 22–32)
Calcium: 7.9 mg/dL — ABNORMAL LOW (ref 8.9–10.3)
Chloride: 95 mmol/L — ABNORMAL LOW (ref 98–111)
Creatinine, Ser: 0.97 mg/dL (ref 0.61–1.24)
GFR, Estimated: >60 mL/min (ref >60)
Glucose, Bld: 128 mg/dL — ABNORMAL HIGH (ref 70–99)
Potassium: 4.1 mmol/L (ref 3.5–5.1)
Sodium: 129 mmol/L — ABNORMAL LOW (ref 135–145)

## 2022-09-09 LAB — GLUCOSE, CAPILLARY: Glucose-Capillary: 164 mg/dL — ABNORMAL HIGH (ref 70–99)

## 2022-09-09 LAB — TYPE AND SCREEN: Unit division: 0

## 2022-09-09 LAB — HEMOGLOBIN AND HEMATOCRIT, BLOOD
HCT: 21.7 % — ABNORMAL LOW (ref 39.0–52.0)
Hemoglobin: 7.1 g/dL — ABNORMAL LOW (ref 13.0–17.0)

## 2022-09-09 LAB — BPAM RBC: Blood Product Expiration Date: 202405272359

## 2022-09-09 LAB — PREPARE RBC (CROSSMATCH)

## 2022-09-09 SURGERY — CYSTOURETEROSCOPY, WITH RETROGRADE PYELOGRAM AND STENT INSERTION
Anesthesia: General

## 2022-09-09 MED ORDER — DEXAMETHASONE SODIUM PHOSPHATE 10 MG/ML IJ SOLN
INTRAMUSCULAR | Status: DC | PRN
  Administered 2022-09-09: 10 mg via INTRAVENOUS

## 2022-09-09 MED ORDER — LIDOCAINE 2% (20 MG/ML) 5 ML SYRINGE
INTRAMUSCULAR | Status: DC | PRN
  Administered 2022-09-09: 60 mg via INTRAVENOUS

## 2022-09-09 MED ORDER — OXYCODONE HCL 5 MG PO TABS: 5.0000 mg | ORAL_TABLET | Freq: Once | ORAL | Status: DC | PRN

## 2022-09-09 MED ORDER — AMISULPRIDE (ANTIEMETIC) 5 MG/2ML IV SOLN: 10.0000 mg | Freq: Once | INTRAVENOUS | Status: DC | PRN

## 2022-09-09 MED ORDER — SODIUM CHLORIDE 0.9% IV SOLUTION: Freq: Once | INTRAVENOUS | Status: DC

## 2022-09-09 MED ORDER — PROPOFOL 10 MG/ML IV BOLUS
INTRAVENOUS | Status: AC
  Filled 2022-09-09: qty 20

## 2022-09-09 MED ORDER — CHLORHEXIDINE GLUCONATE CLOTH 2 % EX PADS
6.0000 | MEDICATED_PAD | Freq: Every day | CUTANEOUS | Status: DC
  Administered 2022-09-09 – 2022-09-13 (×5): 6 via TOPICAL

## 2022-09-09 MED ORDER — SUCCINYLCHOLINE CHLORIDE 200 MG/10ML IV SOSY
PREFILLED_SYRINGE | INTRAVENOUS | Status: DC | PRN
  Administered 2022-09-09: 100 mg via INTRAVENOUS

## 2022-09-09 MED ORDER — LIDOCAINE 2% (20 MG/ML) 5 ML SYRINGE
INTRAMUSCULAR | Status: AC
  Filled 2022-09-09: qty 5

## 2022-09-09 MED ORDER — PHENYLEPHRINE 80 MCG/ML (10ML) SYRINGE FOR IV PUSH (FOR BLOOD PRESSURE SUPPORT)
PREFILLED_SYRINGE | INTRAVENOUS | Status: AC
  Filled 2022-09-09: qty 20

## 2022-09-09 MED ORDER — PROMETHAZINE HCL 25 MG/ML IJ SOLN: 6.2500 mg | INTRAMUSCULAR | Status: DC | PRN

## 2022-09-09 MED ORDER — ROCURONIUM BROMIDE 10 MG/ML (PF) SYRINGE
PREFILLED_SYRINGE | INTRAVENOUS | Status: DC | PRN
  Administered 2022-09-09: 20 mg via INTRAVENOUS
  Administered 2022-09-09 (×3): 40 mg via INTRAVENOUS

## 2022-09-09 MED ORDER — ACETAMINOPHEN 10 MG/ML IV SOLN: 1000.0000 mg | Freq: Once | INTRAVENOUS | Status: DC | PRN

## 2022-09-09 MED ORDER — SODIUM CHLORIDE 0.9 % IV BOLUS
1000.0000 mL | Freq: Once | INTRAVENOUS | Status: AC
  Administered 2022-09-09: 1000 mL via INTRAVENOUS

## 2022-09-09 MED ORDER — PHENYLEPHRINE HCL-NACL 20-0.9 MG/250ML-% IV SOLN
INTRAVENOUS | Status: DC | PRN
  Administered 2022-09-09: 40 ug/min via INTRAVENOUS

## 2022-09-09 MED ORDER — DEXTROSE 5 % IV SOLN: 1.0000 g | Freq: Once | INTRAVENOUS | Status: DC

## 2022-09-09 MED ORDER — PHENYLEPHRINE 80 MCG/ML (10ML) SYRINGE FOR IV PUSH (FOR BLOOD PRESSURE SUPPORT)
PREFILLED_SYRINGE | INTRAVENOUS | Status: DC | PRN
  Administered 2022-09-09 (×2): 80 ug via INTRAVENOUS
  Administered 2022-09-09 (×3): 160 ug via INTRAVENOUS
  Administered 2022-09-09: 240 ug via INTRAVENOUS

## 2022-09-09 MED ORDER — DEXAMETHASONE SODIUM PHOSPHATE 10 MG/ML IJ SOLN
INTRAMUSCULAR | Status: AC
  Filled 2022-09-09: qty 1

## 2022-09-09 MED ORDER — ROCURONIUM BROMIDE 10 MG/ML (PF) SYRINGE
PREFILLED_SYRINGE | INTRAVENOUS | Status: AC
  Filled 2022-09-09: qty 20

## 2022-09-09 MED ORDER — FENTANYL CITRATE (PF) 250 MCG/5ML IJ SOLN
INTRAMUSCULAR | Status: AC
  Filled 2022-09-09: qty 5

## 2022-09-09 MED ORDER — FENTANYL CITRATE PF 50 MCG/ML IJ SOSY: 25.0000 ug | PREFILLED_SYRINGE | INTRAMUSCULAR | Status: DC | PRN

## 2022-09-09 MED ORDER — SUCCINYLCHOLINE CHLORIDE 200 MG/10ML IV SOSY
PREFILLED_SYRINGE | INTRAVENOUS | Status: AC
  Filled 2022-09-09: qty 10

## 2022-09-09 MED ORDER — ONDANSETRON HCL 4 MG/2ML IJ SOLN
INTRAMUSCULAR | Status: DC | PRN
  Administered 2022-09-09: 4 mg via INTRAVENOUS

## 2022-09-09 MED ORDER — ALBUMIN HUMAN 5 % IV SOLN: INTRAVENOUS | Status: DC | PRN

## 2022-09-09 MED ORDER — OXYCODONE HCL 5 MG/5ML PO SOLN: 5.0000 mg | Freq: Once | ORAL | Status: DC | PRN

## 2022-09-09 MED ORDER — FENTANYL CITRATE (PF) 250 MCG/5ML IJ SOLN
INTRAMUSCULAR | Status: DC | PRN
  Administered 2022-09-09: 100 ug via INTRAVENOUS

## 2022-09-09 MED ORDER — SUGAMMADEX SODIUM 200 MG/2ML IV SOLN
INTRAVENOUS | Status: DC | PRN
  Administered 2022-09-09 (×2): 200 mg via INTRAVENOUS

## 2022-09-09 MED ORDER — ONDANSETRON HCL 4 MG/2ML IJ SOLN
INTRAMUSCULAR | Status: AC
  Filled 2022-09-09: qty 2

## 2022-09-09 MED ORDER — PROPOFOL 10 MG/ML IV BOLUS
INTRAVENOUS | Status: DC | PRN
  Administered 2022-09-09: 50 mg via INTRAVENOUS

## 2022-09-09 MED ORDER — 0.9 % SODIUM CHLORIDE (POUR BTL) OPTIME
TOPICAL | Status: DC | PRN
  Administered 2022-09-09: 1000 mL

## 2022-09-09 MED ORDER — MIDAZOLAM HCL 2 MG/2ML IJ SOLN
INTRAMUSCULAR | Status: AC
  Filled 2022-09-09: qty 2

## 2022-09-09 MED ORDER — IOHEXOL 300 MG/ML  SOLN
INTRAMUSCULAR | Status: DC | PRN
  Administered 2022-09-09: 10 mL via INTRATHECAL

## 2022-09-09 SURGICAL SUPPLY — 39 items
BAG COUNTER SPONGE SURGICOUNT (BAG) ×4 IMPLANT
BAG DRN RND TRDRP ANRFLXCHMBR (UROLOGICAL SUPPLIES) ×1
BAG SPNG CNTER NS LX DISP (BAG) ×2
BAG URINE DRAIN 2000ML AR STRL (UROLOGICAL SUPPLIES) IMPLANT
BAG URO CATCHER STRL LF (MISCELLANEOUS) ×2 IMPLANT
BASKET ZERO TIP NITINOL 2.4FR (BASKET) ×2 IMPLANT
BSKT STON RTRVL ZERO TP 2.4FR (BASKET) ×1
CATH HEMA 3WAY 30CC 24FR COUDE (CATHETERS) IMPLANT
CATH URETL OPEN END 6FR 70 (CATHETERS) IMPLANT
DRAPE LAPAROTOMY 100X72X124 (DRAPES) IMPLANT
DRAPE UNDERBUTTOCKS STRL (DISPOSABLE) IMPLANT
ELECT BLADE 4.0 EZ CLEAN MEGAD (MISCELLANEOUS) ×1
ELECTRODE BLDE 4.0 EZ CLN MEGD (MISCELLANEOUS) IMPLANT
EVACUATOR SILICONE 100CC (DRAIN) IMPLANT
GLOVE BIO SURGEON STRL SZ7.5 (GLOVE) ×2 IMPLANT
GUIDEWIRE ANG ZIPWIRE 038X150 (WIRE) ×2 IMPLANT
HANDLE SUCTION POOLE (INSTRUMENTS) IMPLANT
HANDLE YANKAUER SUCT OPEN TIP (MISCELLANEOUS) IMPLANT
IV NS IRRIG 3000ML ARTHROMATIC (IV SOLUTION) ×2 IMPLANT
KIT BASIN OR (CUSTOM PROCEDURE TRAY) IMPLANT
KIT TURNOVER KIT B (KITS) ×2 IMPLANT
LEGGING LITHOTOMY PAIR STRL (DRAPES) IMPLANT
MANIFOLD NEPTUNE II (INSTRUMENTS) ×4 IMPLANT
NS IRRIG 1000ML POUR BTL (IV SOLUTION) ×2 IMPLANT
PACK CYSTO (CUSTOM PROCEDURE TRAY) ×2 IMPLANT
PACK UNIVERSAL I (CUSTOM PROCEDURE TRAY) IMPLANT
PAD ABD 8X10 STRL (GAUZE/BANDAGES/DRESSINGS) IMPLANT
PENCIL FOOT CONTROL (ELECTRODE) IMPLANT
PENCIL SMOKE EVACUATOR (MISCELLANEOUS) IMPLANT
SOL PREP POV-IOD 4OZ 10% (MISCELLANEOUS) ×2 IMPLANT
SPONGE T-LAP 18X18 ~~LOC~~+RFID (SPONGE) IMPLANT
STAPLER VISISTAT 35W (STAPLE) IMPLANT
SUCTION POOLE HANDLE (INSTRUMENTS) ×2
SUT CHROMIC 2 0 SH (SUTURE) IMPLANT
SUT PDS AB 0 CT1 27 (SUTURE) IMPLANT
SUT VIC AB 2-0 SH 27 (SUTURE) ×3
SUT VIC AB 2-0 SH 27X BRD (SUTURE) IMPLANT
SUT VIC AB 2-0 SH 27XBRD (SUTURE) IMPLANT
TUBE CONNECTING 12X1/4 (SUCTIONS) IMPLANT

## 2022-09-09 NOTE — Op Note (Signed)
Operative Note  Preoperative diagnosis:  1.  Gross hematuria and clot retention 2.  Right hydronephrosis  Postoperative diagnosis: 1.  Gross hematuria and clot retention with intraperitoneal bladder perforation 2.  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  Right hydronephrosis  Procedure(s): 1.  Cystoscopy with clot evacuation 2.  Exploratory laparotomy with closure of intraperitoneal bladder perforation  Surgeon: Rhoderick Moody, MD  Assistants:  None  Anesthesia:  General  Complications:  None  EBL: 100 mL  Specimens: 1.  Bladder tumor  Drains/Catheters: 1.  24 French three-way hematuria catheter with 20 mL in the balloon 2.  Right lower quadrant JP drain  Intraoperative findings:   Significant amount of clot burden was evacuated cystoscopically.  During clot evacuation fat could be seen involving the posterior bladder wall.  A cystogram was obtained that revealed an intraperitoneal bladder perforation Watertight bladder closure   Indication:  Noah Rosales is a 62 y.o. male with who presented to the emergency department with worsening abdominal pain, hematuria and difficulty voiding.  CT stone study revealed a markedly distended bladder with right-sided hydronephrosis.  Attempts were made by the nursing staff to irrigate his Foley catheter, but were unsuccessful.  The patient subsequently started developing worsening abdominal pain, which prompted urgent evaluation at 4 AM.  The patient was given Ativan prior to the procedure and cannot be given informed consent.  Description of procedure:  The patient was taken urgently to the operating room and general endotracheal anesthesia was administered.  The patient was placed in the dorsolithotomy position and prepped and draped in usual sterile fashion.  A timeout was then performed.  A 21 French rigid cystoscope was then inserted into the bladder, revealing a significant amount of clot as  well as papillary bladder tumor.  I was able to irrigate approximately 1 L worth of clot intraoperatively, but found perivesical fat along the posterior bladder wall.  This prompted an intraoperative cystogram, which revealed evidence of an intraperitoneal bladder perforation.  A 24 French hematuria catheter was placed.  The patient's abdomen was then prepped and draped in usual sterile fashion.  A lower midline incision was then made.  The space of Retzius was developed and the bladder was bivalved, revealing a 2 cm posterior bladder perforation along with a papillary bladder tumor was extensively involving the area that the right ureteral orifice should have been identified in.  A biopsy of the bladder tumor was obtained and sent for pathologic analysis.  The the peritoneal cavity was entered and approximately 1 L of irrigant was evacuated.  The bladder was completely mobilized both anteriorly and posteriorly until the posterior bladder defect could be clearly seen.  The posterior bladder wall defect was then closed in 2 layers using a 2-0 Vicryl in the mucosal layer and a 2-0 chromic in the perivesical layer.  The anterior bladder incision was then closed in 2 layers as well, similar fashion.  The Foley catheter was then irrigated with approximately 250 mL of saline and the bladder repair was found to be watertight.  A 14 [place] JP drain was then placed in the right lower quadrant and set to bulb suction.  The fascia of the lower midline incision was then reapproximated with 0 PDS suture.  The skin incision was then reapproximated with skin staples and dressed appropriately.  His Foley catheter was extensively hand irrigated and was found to be free of clot and draining light pink irrigant/urine.  The patient was then awoken  from anesthesia having tolerated the procedure well.  Plan: Monitor on the floor.  IR will be consulted to place a right-sided nephrostomy tube.

## 2022-09-09 NOTE — Consult Note (Addendum)
Urology Consult   Physician requesting consult: Lyda Perone, MD  Reason for consult: Gross hematuria  History of Present Illness: Noah Rosales is a 62 y.o. male who presents to the emergency department on 09/08/2022 with diffuse abdominal pain and gross hematuria.  History of EtOH abuse and per his chart, the patient stated that he felt that he was passing a kidney stone and was drinking alcohol to address the pain.  He is currently sedated after receiving multiple doses of Ativan and cannot answer historic questions.  I was called urgently to the bedside by the nursing staff after his Foley catheter stopped draining and his abdomen started become more distended.  CT stone study from yesterday afternoon revealed a diffusely distended bladder along with right-sided hydronephrosis and an indeterminant 1.8 cm right renal mass.   Past Medical History:  Diagnosis Date   Alcohol abuse    CAD (coronary artery disease)    GERD (gastroesophageal reflux disease)    Myocardial infarction [facility]) 2005   s/p stent to right coronary artery    Past Surgical History:  Procedure Laterality Date   Exploration of mediastinum, extracorporeal circulation,  11/23/10   Dr Laneta Simmers   s/p cagb x 4  11/22/10   Dr Laneta Simmers    Current Hospital Medications:  Home Meds:  No outpatient medications have been marked as taking for the 09/08/22 encounter [facility] Encounter).    Scheduled Meds:  folic acid  1 mg Oral Daily   multivitamin with minerals  1 tablet Oral Daily   thiamine  100 mg Oral Daily   Or   thiamine  100 mg Intravenous Daily   Continuous Infusions:  sodium chloride 100 mL/hr at 09/09/22 0005   cefTRIAXone (ROCEPHIN)  IV     sodium chloride irrigation 0 mL (09/08/22 2230)   PRN Meds:.acetaminophen **OR** acetaminophen, LORazepam **OR** LORazepam, morphine injection, ondansetron **OR** ondansetron (ZOFRAN) IV  Allergies:  Allergies  Allergen Reactions   Sertraline Hcl Rash    Zoloft     Family History  Problem Relation Age of Onset   Heart disease Other     Social History:  reports that he has been smoking. He does not have any smokeless tobacco history on file. He reports current alcohol use. No history on file for drug use.  ROS: A complete review of systems was performed.  All systems are negative except for pertinent findings as noted.  Physical Exam:  Vital signs in last 24 hours: Temp:  [98 F (36.7 C)-99.1 F (37.3 C)] 99 F (37.2 C) (05/27 0450) Pulse Rate:  [85-126] 126 (05/27 0450) Resp:  [18-25] 23 (05/27 0450) BP: (148-200)/(76-119) 151/79 (05/27 0450) SpO2:  [83 %-100 %] 83 % (05/27 0450) Weight:  [95.3 kg] 95.3 kg (05/26 1601) Constitutional: Sedated, but responsive to painful stimuli.  No family at bedside cardiovascular:  Regular rate and rhythm, No JVD Respiratory: Normal respiratory effort, Lungs clear bilaterally GI: Distended in the lower abdomen, but not responsive to deep palpation.   GU: 20 French three-way Foley catheter in place with no output  Laboratory Data:  Recent Labs    09/08/22 1650 09/09/22 0155  WBC 7.6 14.5*  HGB 9.0* 8.5*  HCT 28.6* 26.5*  PLT 112* 246    Recent Labs    09/08/22 1650 09/09/22 0155  NA 126* 129*  K 4.1 4.1  CL 94* 95*  GLUCOSE 103* 128*  BUN 7* 8  CALCIUM 7.7* 7.9*  CREATININE 0.83 0.97     Results  for orders placed or performed during the hospital encounter of 09/08/22 (from the past 24 hour(s))  Urinalysis, Routine w reflex microscopic -Urine, Clean Catch     Status: Abnormal   Collection Time: 09/08/22  4:11 PM  Result Value Ref Range   Color, Urine RED (A) YELLOW   APPearance TURBID (A) CLEAR   Specific Gravity, Urine  1.005 - 1.030    TEST NOT REPORTED DUE TO COLOR INTERFERENCE OF URINE PIGMENT   pH  5.0 - 8.0    TEST NOT REPORTED DUE TO COLOR INTERFERENCE OF URINE PIGMENT   Glucose, UA (A) NEGATIVE mg/dL    TEST NOT REPORTED DUE TO COLOR INTERFERENCE OF URINE PIGMENT    Hgb urine dipstick (A) NEGATIVE    TEST NOT REPORTED DUE TO COLOR INTERFERENCE OF URINE PIGMENT   Bilirubin Urine (A) NEGATIVE    TEST NOT REPORTED DUE TO COLOR INTERFERENCE OF URINE PIGMENT   Ketones, ur (A) NEGATIVE mg/dL    TEST NOT REPORTED DUE TO COLOR INTERFERENCE OF URINE PIGMENT   Protein, ur (A) NEGATIVE mg/dL    TEST NOT REPORTED DUE TO COLOR INTERFERENCE OF URINE PIGMENT   Nitrite (A) NEGATIVE    TEST NOT REPORTED DUE TO COLOR INTERFERENCE OF URINE PIGMENT   Leukocytes,Ua (A) NEGATIVE    TEST NOT REPORTED DUE TO COLOR INTERFERENCE OF URINE PIGMENT  Urinalysis, Microscopic (reflex)     Status: Abnormal   Collection Time: 09/08/22  4:11 PM  Result Value Ref Range   RBC / HPF >50 0 - 5 RBC/hpf   WBC, UA >50 0 - 5 WBC/hpf   Bacteria, UA MANY (A) NONE SEEN   Squamous Epithelial / HPF 0-5 0 - 5 /HPF   Mucus PRESENT   CBC with Differential     Status: Abnormal   Collection Time: 09/08/22  4:50 PM  Result Value Ref Range   WBC 7.6 4.0 - 10.5 K/uL   RBC 2.91 (L) 4.22 - 5.81 MIL/uL   Hemoglobin 9.0 (L) 13.0 - 17.0 g/dL   HCT 16.1 (L) 09.6 - 04.5 %   MCV 98.3 80.0 - 100.0 fL   MCH 30.9 26.0 - 34.0 pg   MCHC 31.5 30.0 - 36.0 g/dL   RDW 40.9 81.1 - 91.4 %   Platelets 112 (L) 150 - 400 K/uL   nRBC 0.0 0.0 - 0.2 %   Neutrophils Relative % 82 %   Neutro Abs 6.1 1.7 - 7.7 K/uL   Lymphocytes Relative 12 %   Lymphs Abs 0.9 0.7 - 4.0 K/uL   Monocytes Relative 6 %   Monocytes Absolute 0.5 0.1 - 1.0 K/uL   Eosinophils Relative 0 %   Eosinophils Absolute 0.0 0.0 - 0.5 K/uL   Basophils Relative 0 %   Basophils Absolute 0.0 0.0 - 0.1 K/uL   Immature Granulocytes 0 %   Abs Immature Granulocytes 0.03 0.00 - 0.07 K/uL  Comprehensive metabolic panel     Status: Abnormal   Collection Time: 09/08/22  4:50 PM  Result Value Ref Range   Sodium 126 (L) 135 - 145 mmol/L   Potassium 4.1 3.5 - 5.1 mmol/L   Chloride 94 (L) 98 - 111 mmol/L   CO2 20 (L) 22 - 32 mmol/L   Glucose, Bld 103 (H) 70  - 99 mg/dL   BUN 7 (L) 8 - 23 mg/dL   Creatinine, Ser 7.82 0.61 - 1.24 mg/dL   Calcium 7.7 (L) 8.9 - 10.3 mg/dL   Total Protein  6.0 (L) 6.5 - 8.1 g/dL   Albumin 3.1 (L) 3.5 - 5.0 g/dL   AST 46 (H) 15 - 41 U/L   ALT 33 0 - 44 U/L   Alkaline Phosphatase 83 38 - 126 U/L   Total Bilirubin 1.2 0.3 - 1.2 mg/dL   GFR, Estimated >16 >10 mL/min   Anion gap 12 5 - 15  CBC     Status: Abnormal   Collection Time: 09/09/22  1:55 AM  Result Value Ref Range   WBC 14.5 (H) 4.0 - 10.5 K/uL   RBC 2.77 (L) 4.22 - 5.81 MIL/uL   Hemoglobin 8.5 (L) 13.0 - 17.0 g/dL   HCT 96.0 (L) 45.4 - 09.8 %   MCV 95.7 80.0 - 100.0 fL   MCH 30.7 26.0 - 34.0 pg   MCHC 32.1 30.0 - 36.0 g/dL   RDW 11.9 14.7 - 82.9 %   Platelets 246 150 - 400 K/uL   nRBC 0.0 0.0 - 0.2 %  Basic metabolic panel     Status: Abnormal   Collection Time: 09/09/22  1:55 AM  Result Value Ref Range   Sodium 129 (L) 135 - 145 mmol/L   Potassium 4.1 3.5 - 5.1 mmol/L   Chloride 95 (L) 98 - 111 mmol/L   CO2 20 (L) 22 - 32 mmol/L   Glucose, Bld 128 (H) 70 - 99 mg/dL   BUN 8 8 - 23 mg/dL   Creatinine, Ser 5.62 0.61 - 1.24 mg/dL   Calcium 7.9 (L) 8.9 - 10.3 mg/dL   GFR, Estimated >13 >08 mL/min   Anion gap 14 5 - 15  Glucose, capillary     Status: Abnormal   Collection Time: 09/09/22  4:17 AM  Result Value Ref Range   Glucose-Capillary 164 (H) 70 - 99 mg/dL   No results found for this or any previous visit (from the past 240 hour(s)).  Renal Function: Recent Labs    09/08/22 1650 09/09/22 0155  CREATININE 0.83 0.97   Estimated Creatinine Clearance: 88.5 mL/min (by C-G formula based on SCr of 0.97 mg/dL).  Radiologic Imaging: CT Renal Stone Study  Result Date: 09/08/2022 CLINICAL DATA:  Abdominal pain. EXAM: CT ABDOMEN AND PELVIS WITHOUT CONTRAST TECHNIQUE: Multidetector CT imaging of the abdomen and pelvis was performed following the standard protocol without IV contrast. RADIATION DOSE REDUCTION: This exam was performed according  to the departmental dose-optimization program which includes automated exposure control, adjustment of the mA and/or kV according to patient size and/or use of iterative reconstruction technique. COMPARISON:  November 07, 2021 FINDINGS: Lower chest: Small left pleural effusion. Normal heart size calcific atherosclerotic disease of the coronary arteries and aorta. Hepatobiliary: No focal liver abnormality is seen. Status post cholecystectomy. No biliary dilatation. Pancreas: Unremarkable. No pancreatic ductal dilatation or surrounding inflammatory changes. Spleen: Normal in size without focal abnormality. Adrenals/Urinary Tract: Normal adrenal glands. Normal left kidney and left ureter. 1.8 cm indeterminate right renal mass, exophytic off of the posterior pole of the right kidney. Marked right hydronephrosis and hydroureter to the level of the vesicoureteral junction. The urinary bladder is markedly distended and contains what appears to be multiple blood clots. There are also gas bubbles within the urinary bladder. Urinary Foley has been placed. Stomach/Bowel: Stomach is within normal limits. No evidence of appendicitis. No evidence of bowel wall thickening, distention, or inflammatory changes. Vascular/Lymphatic: Aortic atherosclerosis. No enlarged abdominal or pelvic lymph nodes. Reproductive: Mildly enlarged prostate gland. Other: No abdominal wall hernia or abnormality. No abdominopelvic  ascites. Musculoskeletal: No acute or significant osseous findings. IMPRESSION: 1. Marked right hydronephrosis and hydroureter to the level of the vesicoureteral junction. The urinary bladder is markedly distended, to 21.9 cm, and contains what appears to be multiple blood clots. There are also gas bubbles within the urinary bladder. Urinary Foley has been placed. 2. 1.8 cm indeterminate right renal mass, exophytic off of the posterior pole of the right kidney. Further evaluation with renal protocol CT or MRI may be considered, when  clinically appropriate. 3. Small left pleural effusion. 4. Aortic atherosclerosis. Aortic Atherosclerosis (ICD10-I70.0). Electronically Signed   By: Ted Mcalpine M.D.   On: 09/08/2022 17:53    I independently reviewed the above imaging studies.  Procedure: Foley catheter exchange with bladder irrigation  The patient's genitalia were prepped in the usual fashion.  A 24 French three-way Foley catheter was then inserted with return of a large volume of grossly bloody urine/clot.  The Foley catheter was extensively hand irrigated with return of approximately 2 L of clot.  The catheter was then placed to gravity drainage, with persistent grossly bloody urine.  Impression/Recommendation 62 year old male with clot urinary retention, concomitant right-sided hydronephrosis likely secondary to bladder outlet obstruction and an indeterminate 1.8 cm right renal mass.  History of EtOH abuse and CAD  -Foley catheter exchanged for a 24 French hematuria catheter.  I was able to irrigate approximately 2 L  of clot out at the bedside.  Given degree of abdominal distention, will proceed urgently to the operating room for cystoscopy and clot evacuation and possible stent placement.  The patient is sedated after receiving Ativan and is unable to sign his own consent.  No family is at bedside.  Rhoderick Moody, MD [facility] 09/09/2022, 5:20 AM

## 2022-09-09 NOTE — Consult Note (Signed)
Chief Complaint: Patient was seen in consultation today for right hydronephrosis.  Referring Physician(s): Rhoderick Moody, MD  Supervising Physician: Irish Lack  Patient Status: [facility] - In-pt  History of Present Illness: Noah Rosales is a 62 y.o. male with a past medical history significant for ETOH abuse, GERD, CAD, MI s/p CABG x 4 who presented to [facility] ED last night with complaints of hematuria and flank pain. He reported acute onset of bilateral flank pain and hematuria earlier that morning, he thought that he was passing a kidney stone and had several alcoholic beverages throughout the day to help with the pain. CT stone study was obtained which showed:  1. Marked right hydronephrosis and hydroureter to the level of the vesicoureteral junction. The urinary bladder is markedly distended, to 21.9 cm, and contains what appears to be multiple blood clots. There are also gas bubbles within the urinary bladder. Urinary Foley has been placed. 2. 1.8 cm indeterminate right renal mass, exophytic off of the posterior pole of the right kidney. Further evaluation with renal protocol CT or MRI may be considered, when clinically appropriate. 3. Small left pleural effusion. 4. Aortic atherosclerosis.   He was admitted for further evaluation. Later that night his foley was found to not be draining and his abdomen was becoming more distended. Urology was consulted and the foley was exchanged for a large catheter and 2L of clot was irrigated. Given his degree of abdominal distention decision was made to proceed to the OR emergently. 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   IR has been consulted for right PCN placement.  Patient seen in PACU, very sedated, will open eyes to sternal rub but quickly falls back asleep. Discussed PCN placement with  patient's significant other [name] via phone who gives consent to proceed.  Past Medical History:  Diagnosis Date   Alcohol abuse    CAD (coronary artery disease)    GERD (gastroesophageal reflux disease)    Myocardial infarction ([facility]) 2005   s/p stent to right coronary artery    Past Surgical History:  Procedure Laterality Date   Exploration of mediastinum, extracorporeal circulation,  11/23/10   Dr Laneta Simmers   s/p cagb x 4  11/22/10   Dr Laneta Simmers    Allergies: Sertraline hcl  Medications: Prior to Admission medications   Not on File     Family History  Problem Relation Age of Onset   Heart disease Other     Social History   Socioeconomic History   Marital status: Married    Spouse name: Not on file   Number of children: Not on file   Years of education: Not on file   Highest education level: Not on file  Occupational History   Not on file  Tobacco Use   Smoking status: Every Day   Smokeless tobacco: Not on file  Substance and Sexual Activity   Alcohol use: Yes    Comment: drinks 8 beers every night   Drug use: Not on file   Sexual activity: Not on file  Other Topics Concern   Not on file  Social History Narrative   Not on file   Social Determinants of Health   Financial Resource Strain: Not on file  Food Insecurity: Not on file  Transportation Needs: Not on file  Physical Activity: Not on file  Stress: Not on file  Social Connections: Not  on file     Review of Systems: A 12 point ROS discussed and pertinent positives are indicated in the HPI above.  All other systems are negative.  Review of Systems  Unable to perform ROS: Patient unresponsive    Vital Signs: BP 127/72   Pulse 89   Temp 97.6 F (36.4 C)   Resp 12   Ht 5\' 8"  (1.727 m)   Wt 210 lb (95.3 kg)   SpO2 100%   BMI 31.93 kg/m   Physical Exam Vitals reviewed.  Constitutional:      General: He is not in acute distress.    Comments: Somnolent (recent anesthesia) - arouses  to sternal rub briefly  HENT:     Head: Normocephalic.  Cardiovascular:     Rate and Rhythm: Normal rate and regular rhythm.  Pulmonary:     Effort: Pulmonary effort is normal.     Breath sounds: Normal breath sounds.  Abdominal:     General: There is no distension.     Palpations: Abdomen is soft.     Comments: (+) JP drain to lower midline abdomen with bloody output present.  Skin:    General: Skin is warm and dry.      MD Evaluation Airway: WNL Heart: WNL Abdomen: WNL Chest/ Lungs: WNL ASA  Classification: 3 Mallampati/Airway Score: Two   Imaging: CT Renal Stone Study  Result Date: 09/08/2022 CLINICAL DATA:  Abdominal pain. EXAM: CT ABDOMEN AND PELVIS WITHOUT CONTRAST TECHNIQUE: Multidetector CT imaging of the abdomen and pelvis was performed following the standard protocol without IV contrast. RADIATION DOSE REDUCTION: This exam was performed according to the departmental dose-optimization program which includes automated exposure control, adjustment of the mA and/or kV according to patient size and/or use of iterative reconstruction technique. COMPARISON:  November 07, 2021 FINDINGS: Lower chest: Small left pleural effusion. Normal heart size calcific atherosclerotic disease of the coronary arteries and aorta. Hepatobiliary: No focal liver abnormality is seen. Status post cholecystectomy. No biliary dilatation. Pancreas: Unremarkable. No pancreatic ductal dilatation or surrounding inflammatory changes. Spleen: Normal in size without focal abnormality. Adrenals/Urinary Tract: Normal adrenal glands. Normal left kidney and left ureter. 1.8 cm indeterminate right renal mass, exophytic off of the posterior pole of the right kidney. Marked right hydronephrosis and hydroureter to the level of the vesicoureteral junction. The urinary bladder is markedly distended and contains what appears to be multiple blood clots. There are also gas bubbles within the urinary bladder. Urinary Foley has been  placed. Stomach/Bowel: Stomach is within normal limits. No evidence of appendicitis. No evidence of bowel wall thickening, distention, or inflammatory changes. Vascular/Lymphatic: Aortic atherosclerosis. No enlarged abdominal or pelvic lymph nodes. Reproductive: Mildly enlarged prostate gland. Other: No abdominal wall hernia or abnormality. No abdominopelvic ascites. Musculoskeletal: No acute or significant osseous findings. IMPRESSION: 1. Marked right hydronephrosis and hydroureter to the level of the vesicoureteral junction. The urinary bladder is markedly distended, to 21.9 cm, and contains what appears to be multiple blood clots. There are also gas bubbles within the urinary bladder. Urinary Foley has been placed. 2. 1.8 cm indeterminate right renal mass, exophytic off of the posterior pole of the right kidney. Further evaluation with renal protocol CT or MRI may be considered, when clinically appropriate. 3. Small left pleural effusion. 4. Aortic atherosclerosis. Aortic Atherosclerosis (ICD10-I70.0). Electronically Signed   By: Ted Mcalpine M.D.   On: 09/08/2022 17:53    Labs:  CBC: Recent Labs    11/08/21 0439 11/10/21 0116 09/08/22 1650 09/09/22  0155  WBC 7.9 6.4 7.6 14.5*  HGB 15.0 12.4* 9.0* 8.5*  HCT 41.9 36.9* 28.6* 26.5*  PLT 188 171 112* 246    COAGS: No results for input(s): "INR", "APTT" in the last 8760 hours.  BMP: Recent Labs    11/08/21 0439 11/10/21 0116 09/08/22 1650 09/09/22 0155  NA 133* 138 126* 129*  K 4.2 3.9 4.1 4.1  CL 98 104 94* 95*  CO2 26 28 20* 20*  GLUCOSE 106* 107* 103* 128*  BUN <5* <5* 7* 8  CALCIUM 8.5* 8.1* 7.7* 7.9*  CREATININE 0.69 0.77 0.83 0.97  GFRNONAA >60 >60 >60 >60    LIVER FUNCTION TESTS: Recent Labs    11/07/21 1732 11/08/21 0439 09/08/22 1650  BILITOT 1.2 1.6* 1.2  AST 48* 40 46*  ALT 40 37 33  ALKPHOS 87 77 83  PROT 6.9 6.6 6.0*  ALBUMIN 3.6 3.5 3.1*    TUMOR MARKERS: No results for input(s): "AFPTM",  "CEA", "CA199", "CHROMGRNA" in the last 8760 hours.  Assessment and Plan:  62 y/o M admitted 5/26 with bilateral flank pain and hematuria initially felt to be kidney stones however patient later decompensated with worsening abdominal distention and non-draining foley. 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   IR has been consulted for right PCN placement due to hydronephrosis.   Patient history and imaging reviewed by Dr. Fredia Sorrow who approves procedure. Given recent surgical intervention this morning and normal renal function will plan for procedure tomorrow in IR. Will order right renal US to assess hydronephrosis for pre-procedure planning. Discussed procedure with patient's significant other of 10 years who lives with him and states she has made medical decision for him before and is comfortable doing so today. She gives verbal consent to proceed.  Risks and benefits of right PCN placement was discussed with the patient's significant other [name] via phone including, but not limited to, infection, bleeding, significant bleeding causing loss or decrease in renal function or damage to adjacent structures.   All of the patient's significant other's questions were answered, patient's significant other is agreeable to proceed.  Consent signed and in IR APP office.  Thank you for this interesting consult.  I greatly enjoyed meeting MANRIQUE PICKLER and look forward to participating in their care.  A copy of this report was sent to the requesting provider on this date.  Electronically Signed: Villa Herb, PA-C 09/09/2022, 10:03 AM   I spent a total of 40 Minutes   in face to face in clinical consultation, greater than 50% of which was  counseling/coordinating care for right hydronephrosis.

## 2022-09-09 NOTE — Transfer of Care (Signed)
Immediate Anesthesia Transfer of Care Note  Patient: Noah Rosales  Procedure(s) Performed: CYSTOSCOPY WITH CLOT EVACUATION, CYSTOGRAM CONVERTED TO OPEN BLADDER EXPLORATION  Patient Location: PACU  Anesthesia Type:General  Level of Consciousness: drowsy  Airway & Oxygen Therapy: Patient Spontanous Breathing and Patient connected to face mask oxygen  Post-op Assessment: Report given to RN and Post -op Vital signs reviewed and stable  Post vital signs: Reviewed and stable  Last Vitals:  Vitals Value Taken Time  BP    Temp    Pulse 90 09/09/22 0942  Resp 12 09/09/22 0942  SpO2 100 % 09/09/22 0942  Vitals shown include unvalidated device data.  Last Pain:  Vitals:   09/09/22 0450  TempSrc: Axillary  PainSc:          Complications: No notable events documented.

## 2022-09-09 NOTE — Anesthesia Postprocedure Evaluation (Signed)
Anesthesia Post Note  Patient: RUDRA MINER  Procedure(s) Performed: CYSTOSCOPY WITH CLOT EVACUATION, CYSTOGRAM CONVERTED TO OPEN BLADDER EXPLORATION     Patient location during evaluation: PACU Anesthesia Type: General Level of consciousness: awake and alert Pain management: pain level controlled Vital Signs Assessment: post-procedure vital signs reviewed and stable Respiratory status: spontaneous breathing, nonlabored ventilation, respiratory function stable and patient connected to nasal cannula oxygen Cardiovascular status: blood pressure returned to baseline and stable Postop Assessment: no apparent nausea or vomiting Anesthetic complications: no  No notable events documented.  Last Vitals:  Vitals:   09/09/22 1100 09/09/22 1204  BP: (!) 138/90 122/77  Pulse: (!) 102 (!) 103  Resp: 13 13  Temp:    SpO2: 95% 95%    Last Pain:  Vitals:   09/09/22 1045  TempSrc:   PainSc: Asleep                 Shelton Silvas

## 2022-09-09 NOTE — Significant Event (Signed)
Pt seen and evaluated at bedside.  Called by rapid response RN and informed that CBI 3 way foley flushing in to bladder but had stopped draining.  After discussion with Dr. Liliane Shi over phone: Foley replaced with larger 3 way by rapid response RN.  Dr. Liliane Shi in to bedside and able to manually irrigate and get large amounts of clot out.  Pt now on way to OR urgently with Dr. Liliane Shi.

## 2022-09-09 NOTE — Significant Event (Signed)
Rapid Response Event Note   Reason for Call :  Increased CIWAs, agitation  Pt admitted from ED with CIWA-16. He was given 3mg  ativan per prn order. His CIWA then increased to 22 and he was given 4mg  ativan.   On my assessment, pt's CIWA still 22 Initial Focused Assessment:  Pt lying in bed with eyes closed. He is thrashing around the bed. He is alert to person and place. He denies pain. His ABD is large, distended, taut. On my arrival foley was draining pink/red urine with no clots seen in the tube. He has been given appro 7.5L in via CBI and only 3L out.   HR-121, BP-163/119, RR-24, SpO2-95% on RA.  During assessment foley catheter stopped draining completely and continuous irrigation no longer flowing in catheter. Foley was flushed repeatedly with toomey syringe. Foley able to be flushed but does not drain. Dr. Julian Reil notified as well as Dr. Liliane Shi with urology.   Orders to remove catheter and place 24FR 3 way.   Foley removed and Dr. Liliane Shi placed new foley, irrigated it. Large amounts of clots removed from bladder. Plan for urology to take pt to OR as soon as possible.   Interventions:  Ativan per CIWA protocol Foley irrigated Foley d/c'd 24 FR foley placed by urology and irrigated.  PCU tx order placed for post surgery(if appropriate)  Plan of Care:  Pt does have some s/s of alcohol withdrawal but I think his distended ABD/bladder is a big factor in his agitation. Plan for pt to stay on 64M, go to the OR, and then to PCU(depending on OR interventions). Plan discussed with Dr. Julian Reil and 64M CN.   Pt transferred to PACU with RR RN and SWOT RN.   Event Summary:   MD Notified: Dr. Julian Reil Call Time:0320 Arrival Time:0330 End Time:0550  Terrilyn Saver, RN

## 2022-09-09 NOTE — Anesthesia Preprocedure Evaluation (Addendum)
Anesthesia Evaluation  Patient identified by MRN, date of birth, ID bandGeneral Assessment Comment:Patient responds to verbal and tactile stimulation  Reviewed: Allergy & Precautions, NPO status , Patient's Chart, lab work & pertinent test results  Airway Mallampati: Unable to assess  TM Distance: >3 FB Neck ROM: Full    Dental  (+) Edentulous Upper, Edentulous Lower   Pulmonary Current Smoker and Patient abstained from smoking.   Pulmonary exam normal        Cardiovascular + CAD, + Past MI and + CABG   Rhythm:Regular Rate:Tachycardia     Neuro/Psych negative neurological ROS  negative psych ROS   GI/Hepatic negative GI ROS,,,(+)     substance abuse  alcohol use  Endo/Other  hyponatremia  Renal/GU negative Renal ROS     Musculoskeletal negative musculoskeletal ROS (+)    Abdominal  (+) + obese  Peds  Hematology  (+) Blood dyscrasia, anemia   Anesthesia Other Findings GROSS HEMATURIA  Reproductive/Obstetrics                             Anesthesia Physical Anesthesia Plan  ASA: 3 and emergent  Anesthesia Plan: General   Post-op Pain Management:    Induction: Intravenous  PONV Risk Score and Plan: 2 and Ondansetron, Dexamethasone and Treatment may vary due to age or medical condition  Airway Management Planned: Oral ETT  Additional Equipment:   Intra-op Plan:   Post-operative Plan: Extubation in OR  Informed Consent: I have reviewed the patients History and Physical, chart, labs and discussed the procedure including the risks, benefits and alternatives for the proposed anesthesia with the patient or authorized representative who has indicated his/her understanding and acceptance.     Dental advisory given  Plan Discussed with: CRNA  Anesthesia Plan Comments:         Anesthesia Quick Evaluation

## 2022-09-09 NOTE — Plan of Care (Signed)
  Problem: Education: Goal: Knowledge of General Education information will improve Description: Including pain rating scale, medication(s)/side effects and non-pharmacologic comfort measures Outcome: Progressing   Problem: Health Behavior/Discharge Planning: Goal: Ability to manage health-related needs will improve Outcome: Progressing   Problem: Clinical Measurements: Goal: Ability to maintain clinical measurements within normal limits will improve Outcome: Progressing Goal: Will remain free from infection Outcome: Progressing Goal: Respiratory complications will improve Outcome: Progressing   Problem: Activity: Goal: Risk for activity intolerance will decrease Outcome: Progressing   Problem: Elimination: Goal: Will not experience complications related to bowel motility Outcome: Progressing Goal: Will not experience complications related to urinary retention Outcome: Progressing   Problem: Pain Managment: Goal: General experience of comfort will improve Outcome: Progressing

## 2022-09-09 NOTE — Anesthesia Procedure Notes (Signed)
Procedure Name: Intubation Date/Time: 09/09/2022 6:30 AM  Performed by: Laruth Bouchard., CRNAPre-anesthesia Checklist: Patient identified, Emergency Drugs available, Suction available, Patient being monitored and Timeout performed Patient Re-evaluated:Patient Re-evaluated prior to induction Oxygen Delivery Method: Circle system utilized Preoxygenation: Pre-oxygenation with 100% oxygen Induction Type: IV induction, Rapid sequence and Cricoid Pressure applied Laryngoscope Size: Mac and 4 Grade View: Grade I Tube type: Oral Tube size: 7.5 mm Number of attempts: 1 Airway Equipment and Method: Stylet Placement Confirmation: ETT inserted through vocal cords under direct vision, positive ETCO2 and breath sounds checked- equal and bilateral Secured at: 23 cm Tube secured with: Tape Dental Injury: Teeth and Oropharynx as per pre-operative assessment

## 2022-09-09 NOTE — Progress Notes (Signed)
Pt blood administration complete. Post administration vital signs complete. Pt denies any symptoms. Pt stated "I feel better than yesterday". Pt temp 100.7. Dr. Lyda Perone notified. PRN med.given.

## 2022-09-09 NOTE — Progress Notes (Signed)
   09/09/22 0400  Assess: if the MEWS score is Yellow or Red  Were vital signs taken at a resting state? Yes  Focused Assessment Change from prior assessment (see assessment flowsheet)  Does the patient meet 2 or more of the SIRS criteria? Yes  Does the patient have a confirmed or suspected source of infection? Yes  MEWS guidelines implemented  Yes, yellow  Treat  MEWS Interventions Considered administering scheduled or prn medications/treatments as ordered  Take Vital Signs  Increase Vital Sign Frequency  Yellow: Q2hr x1, continue Q4hrs until patient remains green for 12hrs  Escalate  MEWS: Escalate Yellow: Discuss with charge nurse and consider notifying provider and/or RRT  Notify: Charge Nurse/RN  Name of Charge Nurse/RN Notified Kim, RN  Provider Notification  Provider Name/Title Imogene Burn, E  Date Provider Notified 09/09/22  Time Provider Notified 0408  Method of Notification Page  Notification Reason Change in status  Provider response Other (Comment)

## 2022-09-09 NOTE — Progress Notes (Signed)
PROGRESS NOTE    CAMBRIDGE WHITFIELD  ZOX:[id number] DOB: [birth year] DOA: 09/08/2022 PCP: Pcp, No    Brief Narrative:  62 year old with history of alcoholism, coronary artery disease status post CABG presented to the ER with flank pain and gross hematuria for 1 day.  History of kidney stones.  Drinks 8 beers every day.  He continued to drink alcohol to try to pass what he thought as a kidney stones. In the emerged serum, hemodynamically stable.  Gross hematuria and multiple clots in the urinary bladder.  Started on CBI and manual irrigation.  Taken urgently to operating room for cystoscopy.   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 Continue Foley catheter care.  Monitor JP drain. Possible blockage of right ureter, IR consulted for percutaneous nephrostomy tube. Hemoglobin 9 on presentation-8.5-7.4.  Given patient's ongoing hematuria, will transfuse 1 unit of PRBC now.  Will monitor closely and transfuse to keep hemoglobin more than 8. Further management as per urology. On antibiotic prophylaxis with Rocephin. Biopsy of the bladder mass pending, possible malignancy.  Hyponatremia: Secondary to beer potomania.  On isotonic fluid.  Sodium level is improving.  Alcoholism with alcohol use, risk of withdrawal syndrome: On CIWA protocol.  Multivitamins.  Monitor closely.  Fall and delirium precautions.  Acute metabolic encephalopathy: Multifactorial.  Sedation, anesthesia, alcohol withdrawal.  Monitor closely.   DVT prophylaxis: SCDs Start: 09/08/22 2306   Code Status: Full code Family Communication: Significant other on the phone, consented for blood transfusion. Disposition Plan: Status is: Observation The patient will require care spanning > 2 midnights and should be moved to inpatient  because: Inpatient procedures, significant hematuria     Consultants:  Urology  Procedures:  Cystoscopy, biopsy Percutaneous nephrostomy  Antimicrobials:  Rocephin 5/26---   Subjective: Patient seen after coming back from procedure.  Quite sleepy and difficult to arouse.  Looks dry.  Looks pale.  Patient unable to participate in conversation.  Called and updated patient's significant other on the phone. She was unable to consent for blood, consented from patient's significant other and wrote for 1 unit of PRBC.  Objective: Vitals:   09/09/22 1030 09/09/22 1045 09/09/22 1100 09/09/22 1204  BP: 135/79 138/76 (!) 138/90 122/77  Pulse: 98 98 (!) 102 (!) 103  Resp: 14 13 13 13   Temp:  (!) 97 F (36.1 C)    TempSrc:      SpO2: 98% 96% 95% 95%  Weight:      Height:        Intake/Output Summary (Last 24 hours) at 09/09/2022 1338 Last data filed at 09/09/2022 1235 Gross per 24 hour  Intake 2370 ml  Output 81191 ml  Net -11355 ml   Filed Weights   09/08/22 1601  Weight: 95.3 kg    Examination:  General: Sick looking.  Pale.  Tachycardic. Cardiovascular: S1-S2 normal.  Regular rate rhythm.  Tachycardic. Respiratory: Bilateral clear.  Conducted upper airway sounds. Gastrointestinal: Soft.  Tender on the lower abdomen, immediate postop with JP drain draining serosanguineous discharge. Foley catheter with dark pink urine.  Free-flowing. Ext: No edema or cyanosis. Neuro: Sleepy.  Wakes up on a strong stimulation and goes back to sleep.  Moves all extremities.  No focal deficits.   Data Reviewed: I have personally reviewed following labs and imaging studies  CBC: Recent Labs  Lab 09/08/22 1650  09/09/22 0155 09/09/22 1243  WBC 7.6 14.5* 12.9*  NEUTROABS 6.1  --   --   HGB 9.0* 8.5* 7.4*  HCT 28.6* 26.5* 23.4*  MCV 98.3 95.7 97.9  PLT 112* 246 181   Basic Metabolic Panel: Recent Labs  Lab 09/08/22 1650 09/09/22 0155  NA 126* 129*  K 4.1 4.1  CL 94* 95*  CO2  20* 20*  GLUCOSE 103* 128*  BUN 7* 8  CREATININE 0.83 0.97  CALCIUM 7.7* 7.9*   GFR: Estimated Creatinine Clearance: 88.5 mL/min (by C-G formula based on SCr of 0.97 mg/dL). Liver Function Tests: Recent Labs  Lab 09/08/22 1650  AST 46*  ALT 33  ALKPHOS 83  BILITOT 1.2  PROT 6.0*  ALBUMIN 3.1*   No results for input(s): "LIPASE", "AMYLASE" in the last 168 hours. No results for input(s): "AMMONIA" in the last 168 hours. Coagulation Profile: No results for input(s): "INR", "PROTIME" in the last 168 hours. Cardiac Enzymes: No results for input(s): "CKTOTAL", "CKMB", "CKMBINDEX", "TROPONINI" in the last 168 hours. BNP (last 3 results) No results for input(s): "PROBNP" in the last 8760 hours. HbA1C: No results for input(s): "HGBA1C" in the last 72 hours. CBG: Recent Labs  Lab 09/09/22 0417  GLUCAP 164*   Lipid Profile: No results for input(s): "CHOL", "HDL", "LDLCALC", "TRIG", "CHOLHDL", "LDLDIRECT" in the last 72 hours. Thyroid Function Tests: No results for input(s): "TSH", "T4TOTAL", "FREET4", "T3FREE", "THYROIDAB" in the last 72 hours. Anemia Panel: No results for input(s): "VITAMINB12", "FOLATE", "FERRITIN", "TIBC", "IRON", "RETICCTPCT" in the last 72 hours. Sepsis Labs: No results for input(s): "PROCALCITON", "LATICACIDVEN" in the last 168 hours.  Recent Results (from the past 240 hour(s))  Urine Culture     Status: None (Preliminary result)   Collection Time: 09/08/22  5:08 PM   Specimen: Urine, Catheterized  Result Value Ref Range Status   Specimen Description URINE, CATHETERIZED  Final   Special Requests NONE  Final   Culture   Final    CULTURE REINCUBATED FOR BETTER GROWTH Performed at [facility], [address] [place], [place] [address]    Report Status PENDING  Incomplete         Radiology Studies: US RENAL  Result Date: 09/09/2022 CLINICAL DATA:  Bladder rupture secondary to bladder tumor and right hydronephrosis. EXAM: RENAL / URINARY  TRACT ULTRASOUND COMPLETE COMPARISON:  CT of the abdomen and pelvis on 09/08/2022 FINDINGS: Right Kidney: Renal measurements: 11.8 x 7.4 x 5.1 cm = volume: 232 mL. Persistent severe right hydronephrosis. No solid masses identified. Left Kidney: Renal measurements: 10.6 x 5.7 x 7.0 cm = volume: 220 mL. Echogenicity within normal limits. No mass or hydronephrosis visualized. Bladder: The bladder was not imaged after recent exploratory laparotomy with bladder repair. Other: None. IMPRESSION: 1. Persistent severe right hydronephrosis. 2. Normal appearance of the left kidney. Electronically Signed   By: Irish Lack M.D.   On: 09/09/2022 13:11   DG C-Arm 1-60 Min  Result Date: 09/09/2022 CLINICAL DATA:  Surgery, cystoscopy. EXAM: DG C-ARM 1-60 MIN CONTRAST:  Not provided. FLUOROSCOPY: Fluoroscopy Time:  18.3 seconds Radiation Exposure Index (if provided by the fluoroscopic device): 8.89 mGy Number of Acquired Spot Images: 2 COMPARISON:  CT yesterday FINDINGS: Two fluoroscopic spot views demonstrate contrast filling the presumed urinary bladder, extending laterally in the pelvis. Possible filling defect eccentrically in the bladder, recommend correlation with procedure report. IMPRESSION: Fluoroscopic spot views. Electronically Signed   By: Narda Rutherford M.D.   On: 09/09/2022 12:27  CT Renal Stone Study  Result Date: 09/08/2022 CLINICAL DATA:  Abdominal pain. EXAM: CT ABDOMEN AND PELVIS WITHOUT CONTRAST TECHNIQUE: Multidetector CT imaging of the abdomen and pelvis was performed following the standard protocol without IV contrast. RADIATION DOSE REDUCTION: This exam was performed according to the departmental dose-optimization program which includes automated exposure control, adjustment of the mA and/or kV according to patient size and/or use of iterative reconstruction technique. COMPARISON:  November 07, 2021 FINDINGS: Lower chest: Small left pleural effusion. Normal heart size calcific atherosclerotic  disease of the coronary arteries and aorta. Hepatobiliary: No focal liver abnormality is seen. Status post cholecystectomy. No biliary dilatation. Pancreas: Unremarkable. No pancreatic ductal dilatation or surrounding inflammatory changes. Spleen: Normal in size without focal abnormality. Adrenals/Urinary Tract: Normal adrenal glands. Normal left kidney and left ureter. 1.8 cm indeterminate right renal mass, exophytic off of the posterior pole of the right kidney. Marked right hydronephrosis and hydroureter to the level of the vesicoureteral junction. The urinary bladder is markedly distended and contains what appears to be multiple blood clots. There are also gas bubbles within the urinary bladder. Urinary Foley has been placed. Stomach/Bowel: Stomach is within normal limits. No evidence of appendicitis. No evidence of bowel wall thickening, distention, or inflammatory changes. Vascular/Lymphatic: Aortic atherosclerosis. No enlarged abdominal or pelvic lymph nodes. Reproductive: Mildly enlarged prostate gland. Other: No abdominal wall hernia or abnormality. No abdominopelvic ascites. Musculoskeletal: No acute or significant osseous findings. IMPRESSION: 1. Marked right hydronephrosis and hydroureter to the level of the vesicoureteral junction. The urinary bladder is markedly distended, to 21.9 cm, and contains what appears to be multiple blood clots. There are also gas bubbles within the urinary bladder. Urinary Foley has been placed. 2. 1.8 cm indeterminate right renal mass, exophytic off of the posterior pole of the right kidney. Further evaluation with renal protocol CT or MRI may be considered, when clinically appropriate. 3. Small left pleural effusion. 4. Aortic atherosclerosis. Aortic Atherosclerosis (ICD10-I70.0). Electronically Signed   By: Ted Mcalpine M.D.   On: 09/08/2022 17:53        Scheduled Meds:  sodium chloride   Intravenous Once   Chlorhexidine Gluconate Cloth  6 each Topical Daily    folic acid  1 mg Oral Daily   multivitamin with minerals  1 tablet Oral Daily   thiamine  100 mg Oral Daily   Or   thiamine  100 mg Intravenous Daily   Continuous Infusions:  sodium chloride 100 mL/hr at 09/09/22 1610   cefTRIAXone (ROCEPHIN)  IV       LOS: 0 days    Time spent: 40 minutes    Dorcas Carrow, MD [facility] Pager [phone]

## 2022-09-10 ENCOUNTER — Inpatient Hospital Stay (HOSPITAL_COMMUNITY): Payer: Medicare HMO

## 2022-09-10 ENCOUNTER — Encounter (HOSPITAL_COMMUNITY): Payer: Self-pay | Admitting: Urology

## 2022-09-10 DIAGNOSIS — R310 Gross hematuria: Secondary | ICD-10-CM | POA: Diagnosis not present

## 2022-09-10 HISTORY — PX: IR NEPHROSTOMY PLACEMENT RIGHT: IMG6064

## 2022-09-10 LAB — COMPREHENSIVE METABOLIC PANEL
ALT: 24 U/L (ref 0–44)
AST: 38 U/L (ref 15–41)
Albumin: 2.4 g/dL — ABNORMAL LOW (ref 3.5–5.0)
Alkaline Phosphatase: 45 U/L (ref 38–126)
Anion gap: 6 (ref 5–15)
BUN: 11 mg/dL (ref 8–23)
CO2: 23 mmol/L (ref 22–32)
Calcium: 6.7 mg/dL — ABNORMAL LOW (ref 8.9–10.3)
Chloride: 103 mmol/L (ref 98–111)
Creatinine, Ser: 1.2 mg/dL (ref 0.61–1.24)
GFR, Estimated: >60 mL/min (ref >60)
Glucose, Bld: 151 mg/dL — ABNORMAL HIGH (ref 70–99)
Potassium: 4.5 mmol/L (ref 3.5–5.1)
Sodium: 132 mmol/L — ABNORMAL LOW (ref 135–145)
Total Bilirubin: 2.4 mg/dL — ABNORMAL HIGH (ref 0.3–1.2)
Total Protein: 4.8 g/dL — ABNORMAL LOW (ref 6.5–8.1)

## 2022-09-10 LAB — CBC
HCT: 21 % — ABNORMAL LOW (ref 39.0–52.0)
Hemoglobin: 6.8 g/dL — CL (ref 13.0–17.0)
MCH: 31.3 pg (ref 26.0–34.0)
MCHC: 32.4 g/dL (ref 30.0–36.0)
MCV: 96.8 fL (ref 80.0–100.0)
Platelets: 169 10*3/uL (ref 150–400)
RBC: 2.17 MIL/uL — ABNORMAL LOW (ref 4.22–5.81)
RDW: 15.4 % (ref 11.5–15.5)
WBC: 17.9 10*3/uL — ABNORMAL HIGH (ref 4.0–10.5)
nRBC: 0 % (ref 0.0–0.2)

## 2022-09-10 LAB — TYPE AND SCREEN
ABO/RH(D): O NEG
Antibody Screen: NEGATIVE

## 2022-09-10 LAB — URINALYSIS, W/ REFLEX TO CULTURE (INFECTION SUSPECTED)
Bacteria, UA: NONE SEEN
Bilirubin Urine: NEGATIVE
Glucose, UA: NEGATIVE mg/dL
Ketones, ur: NEGATIVE mg/dL
Leukocytes,Ua: NEGATIVE
Nitrite: NEGATIVE
Protein, ur: NEGATIVE mg/dL
Specific Gravity, Urine: 1.026 (ref 1.005–1.030)
pH: 5 (ref 5.0–8.0)

## 2022-09-10 LAB — BPAM RBC
Blood Product Expiration Date: 202405272359
Blood Product Expiration Date: 202405302359
ISSUE DATE / TIME: 202405271827
Unit Type and Rh: 9500
Unit Type and Rh: 9500

## 2022-09-10 LAB — PREPARE RBC (CROSSMATCH)

## 2022-09-10 LAB — HEMOGLOBIN AND HEMATOCRIT, BLOOD
HCT: 21.6 % — ABNORMAL LOW (ref 39.0–52.0)
HCT: 25.7 % — ABNORMAL LOW (ref 39.0–52.0)
Hemoglobin: 7.1 g/dL — ABNORMAL LOW (ref 13.0–17.0)
Hemoglobin: 8.3 g/dL — ABNORMAL LOW (ref 13.0–17.0)

## 2022-09-10 LAB — URINE CULTURE: Culture: 30000 — AB

## 2022-09-10 LAB — PROTIME-INR
INR: 1.3 — ABNORMAL HIGH (ref 0.8–1.2)
Prothrombin Time: 16.3 seconds — ABNORMAL HIGH (ref 11.4–15.2)

## 2022-09-10 LAB — PHOSPHORUS: Phosphorus: 2.9 mg/dL (ref 2.5–4.6)

## 2022-09-10 LAB — MAGNESIUM: Magnesium: 1.7 mg/dL (ref 1.7–2.4)

## 2022-09-10 MED ORDER — SODIUM CHLORIDE 0.9 % IV SOLN
2.0000 g | Freq: Once | INTRAVENOUS | Status: DC
  Administered 2022-09-10: 2 g via INTRAVENOUS

## 2022-09-10 MED ORDER — ACETAMINOPHEN 325 MG PO TABS
650.0000 mg | ORAL_TABLET | Freq: Once | ORAL | Status: AC
  Administered 2022-09-10: 650 mg via ORAL
  Filled 2022-09-10: qty 2

## 2022-09-10 MED ORDER — FENTANYL CITRATE (PF) 100 MCG/2ML IJ SOLN
INTRAMUSCULAR | Status: AC | PRN
  Administered 2022-09-10 (×2): 25 ug via INTRAVENOUS

## 2022-09-10 MED ORDER — SODIUM CHLORIDE 0.9% IV SOLUTION: Freq: Once | INTRAVENOUS | Status: AC

## 2022-09-10 MED ORDER — LIDOCAINE-EPINEPHRINE 1 %-1:100000 IJ SOLN
20.0000 mL | Freq: Once | INTRAMUSCULAR | Status: AC
  Administered 2022-09-10: 20 mL

## 2022-09-10 MED ORDER — SODIUM CHLORIDE 0.9 % IV SOLN: 1.0000 g | INTRAVENOUS | Status: DC

## 2022-09-10 MED ORDER — MIDAZOLAM HCL 2 MG/2ML IJ SOLN
INTRAMUSCULAR | Status: AC | PRN
  Administered 2022-09-10: 1 mg via INTRAVENOUS

## 2022-09-10 MED ORDER — VANCOMYCIN HCL 1250 MG/250ML IV SOLN
1250.0000 mg | INTRAVENOUS | Status: DC
  Administered 2022-09-10 – 2022-09-11 (×2): 1250 mg via INTRAVENOUS
  Filled 2022-09-10 (×2): qty 250

## 2022-09-10 MED ORDER — LIDOCAINE-EPINEPHRINE 1 %-1:100000 IJ SOLN
INTRAMUSCULAR | Status: AC
  Filled 2022-09-10: qty 1

## 2022-09-10 MED ORDER — FENTANYL CITRATE (PF) 100 MCG/2ML IJ SOLN
INTRAMUSCULAR | Status: AC
  Filled 2022-09-10: qty 2

## 2022-09-10 MED ORDER — DIPHENHYDRAMINE HCL 50 MG/ML IJ SOLN
25.0000 mg | Freq: Once | INTRAMUSCULAR | Status: AC
  Administered 2022-09-10: 25 mg via INTRAVENOUS
  Filled 2022-09-10: qty 1

## 2022-09-10 MED ORDER — LIDOCAINE-EPINEPHRINE (PF) 1 %-1:200000 IJ SOLN: 20.0000 mL | Freq: Once | INTRAMUSCULAR | Status: DC

## 2022-09-10 MED ORDER — SODIUM CHLORIDE 0.9% FLUSH
5.0000 mL | Freq: Three times a day (TID) | INTRAVENOUS | Status: DC
  Administered 2022-09-10 – 2022-09-13 (×9): 5 mL

## 2022-09-10 MED ORDER — MIDAZOLAM HCL 2 MG/2ML IJ SOLN
INTRAMUSCULAR | Status: AC
  Filled 2022-09-10: qty 2

## 2022-09-10 MED ORDER — SODIUM CHLORIDE 0.9 % IV SOLN
INTRAVENOUS | Status: AC
  Filled 2022-09-10: qty 20

## 2022-09-10 MED ORDER — IOHEXOL 300 MG/ML  SOLN
50.0000 mL | Freq: Once | INTRAMUSCULAR | Status: AC | PRN
  Administered 2022-09-10: 10 mL

## 2022-09-10 NOTE — TOC Initial Note (Signed)
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  CBI. Receiving transfusions.  TOC following    Expected Discharge Plan: Home/Self Care Barriers to Discharge: Continued Medical Work up   Patient Goals and CMS Choice            Expected Discharge Plan and Services   Discharge Planning Services: CM Consult   Living arrangements for the past 2 months: Single Family Home                                      Prior Living Arrangements/Services Living arrangements for the past 2 months: Single Family Home Lives with:: Significant Other                   Activities of Daily Living      Permission Sought/Granted                  Emotional Assessment              Admission diagnosis:  Lower urinary tract infectious disease [N39.0] Gross hematuria [R31.0] Right kidney mass [N28.89] Acute urinary retention [R33.8] Hematuria [R31.9] Patient Active Problem List   Diagnosis Date Noted   Hematuria 09/09/2022   Hyponatremia 11/08/2021   Bladder mass 11/08/2021   Gross hematuria 11/07/2021   CAD (coronary artery disease)    GERD (gastroesophageal reflux disease)    Myocardial infarction (HCC)    Alcohol abuse    PCP:  Pcp, No Pharmacy:   [id number] [place] [facility], [place] - [address] AT [facility] [address] [place] [address] Phone: [phone] Fax: [phone]     Social Determinants of Health (SDOH) Social History: SDOH Screenings   Tobacco Use: High Risk (09/10/2022)   SDOH Interventions:     Readmission Risk Interventions     No data to display

## 2022-09-10 NOTE — Procedures (Signed)
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   Plan: Flush PCN w 10 mL and record drain output qShift. Follow up for routine nephrostomy tube exchange in 6 week(s).   See detailed procedure note with images in PACS. The patient tolerated the procedure well without incident or complication and was returned to Floor Bed in stable condition.    Roanna Banning, MD [facility]   Pager. [phone] Clinic. [phone]

## 2022-09-10 NOTE — Progress Notes (Signed)
1 Day Post-Op Subjective: Received PRBC's yesterday/this morning.  NAEO.  Resting comfortably with no complaints this AM.   Objective: Vital signs in last 24 hours: Temp:  [97 F (36.1 C)-100.7 F (38.2 C)] 98.7 F (37.1 C) (05/28 0759) Pulse Rate:  [85-113] 93 (05/28 0759) Resp:  [12-28] 15 (05/28 0759) BP: (105-154)/(62-90) 121/67 (05/28 0759) SpO2:  [85 %-100 %] 97 % (05/28 0759)  Intake/Output from previous day: 05/27 0701 - 05/28 0700 In: 1918 [I.V.:626; Blood:522; IV Piggyback:350] Out: 4040 [Urine:3400; Drains:490; Blood:150]  Intake/Output this shift: No intake/output data recorded.  Physical Exam:  General: Alert and oriented CV: RRR, palpable distal pulses Lungs: CTAB, equal chest rise Abdomen: Soft, NTND, no rebound or guarding Incisions: clean, dry and intact.  JP with serosang output  Gu: 24 F 3-way in place and draining dark, bloody urine w/o clots Ext: NT, No erythema  Lab Results: Recent Labs    09/09/22 2149 09/10/22 0058 09/10/22 0729  HGB 7.1* 6.8* 7.1*  HCT 21.7* 21.0* 21.6*   BMET Recent Labs    09/09/22 0155 09/10/22 0058  NA 129* 132*  K 4.1 4.5  CL 95* 103  CO2 20* 23  GLUCOSE 128* 151*  BUN 8 11  CREATININE 0.97 1.20  CALCIUM 7.9* 6.7*     Studies/Results: US RENAL  Result Date: 09/09/2022 CLINICAL DATA:  Bladder rupture secondary to bladder tumor and right hydronephrosis. EXAM: RENAL / URINARY TRACT ULTRASOUND COMPLETE COMPARISON:  CT of the abdomen and pelvis on 09/08/2022 FINDINGS: Right Kidney: Renal measurements: 11.8 x 7.4 x 5.1 cm = volume: 232 mL. Persistent severe right hydronephrosis. No solid masses identified. Left Kidney: Renal measurements: 10.6 x 5.7 x 7.0 cm = volume: 220 mL. Echogenicity within normal limits. No mass or hydronephrosis visualized. Bladder: The bladder was not imaged after recent exploratory laparotomy with bladder repair. Other: None. IMPRESSION: 1. Persistent severe right hydronephrosis. 2. Normal  appearance of the left kidney. Electronically Signed   By: Irish Lack M.D.   On: 09/09/2022 13:11   DG C-Arm 1-60 Min  Result Date: 09/09/2022 CLINICAL DATA:  Surgery, cystoscopy. EXAM: DG C-ARM 1-60 MIN CONTRAST:  Not provided. FLUOROSCOPY: Fluoroscopy Time:  18.3 seconds Radiation Exposure Index (if provided by the fluoroscopic device): 8.89 mGy Number of Acquired Spot Images: 2 COMPARISON:  CT yesterday FINDINGS: Two fluoroscopic spot views demonstrate contrast filling the presumed urinary bladder, extending laterally in the pelvis. Possible filling defect eccentrically in the bladder, recommend correlation with procedure report. IMPRESSION: Fluoroscopic spot views. Electronically Signed   By: Narda Rutherford M.D.   On: 09/09/2022 12:27   CT Renal Stone Study  Result Date: 09/08/2022 CLINICAL DATA:  Abdominal pain. EXAM: CT ABDOMEN AND PELVIS WITHOUT CONTRAST TECHNIQUE: Multidetector CT imaging of the abdomen and pelvis was performed following the standard protocol without IV contrast. RADIATION DOSE REDUCTION: This exam was performed according to the departmental dose-optimization program which includes automated exposure control, adjustment of the mA and/or kV according to patient size and/or use of iterative reconstruction technique. COMPARISON:  November 07, 2021 FINDINGS: Lower chest: Small left pleural effusion. Normal heart size calcific atherosclerotic disease of the coronary arteries and aorta. Hepatobiliary: No focal liver abnormality is seen. Status post cholecystectomy. No biliary dilatation. Pancreas: Unremarkable. No pancreatic ductal dilatation or surrounding inflammatory changes. Spleen: Normal in size without focal abnormality. Adrenals/Urinary Tract: Normal adrenal glands. Normal left kidney and left ureter. 1.8 cm indeterminate right renal mass, exophytic off of the posterior pole of the right  kidney. Marked right hydronephrosis and hydroureter to the level of the vesicoureteral  junction. The urinary bladder is markedly distended and contains what appears to be multiple blood clots. There are also gas bubbles within the urinary bladder. Urinary Foley has been placed. Stomach/Bowel: Stomach is within normal limits. No evidence of appendicitis. No evidence of bowel wall thickening, distention, or inflammatory changes. Vascular/Lymphatic: Aortic atherosclerosis. No enlarged abdominal or pelvic lymph nodes. Reproductive: Mildly enlarged prostate gland. Other: No abdominal wall hernia or abnormality. No abdominopelvic ascites. Musculoskeletal: No acute or significant osseous findings. IMPRESSION: 1. Marked right hydronephrosis and hydroureter to the level of the vesicoureteral junction. The urinary bladder is markedly distended, to 21.9 cm, and contains what appears to be multiple blood clots. There are also gas bubbles within the urinary bladder. Urinary Foley has been placed. 2. 1.8 cm indeterminate right renal mass, exophytic off of the posterior pole of the right kidney. Further evaluation with renal protocol CT or MRI may be considered, when clinically appropriate. 3. Small left pleural effusion. 4. Aortic atherosclerosis. Aortic Atherosclerosis (ICD10-I70.0). Electronically Signed   By: Ted Mcalpine M.D.   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  Concomitant right hydronephrosis 2/2 right ureteral obstruction from bladder mass.   -Expected appearance of urine following bladder repair.  If he develops clots/obstruction, will hand irrigate -Monitor JP output -Continue empiric rocephin.  If he develops fever, recommend broader antibiotic coverage -IR has been consulted for right PCN.  Serum creatinine slightly elevated from admission -Will continue to monitor   LOS: 1 day   Rhoderick Moody,  MD [facility] Pager: [phone]  09/10/2022, 8:35 AM

## 2022-09-10 NOTE — Progress Notes (Signed)
PROGRESS NOTE    TALLON FINKLE  ZOX:[id number] DOB: Oct 07, 1960 DOA: 09/08/2022 PCP: Pcp, No    Brief Narrative:  62 year old with history of alcoholism, coronary artery disease status post CABG presented to the ER with flank pain and gross hematuria for 1 day.  Patient does have at least more than few weeks history of hematuria.  He was trying to drink beer to pass kidney stones. He continued to drink alcohol to try to pass what he thought as a kidney stones. In the emerged serum, hemodynamically stable.  Gross hematuria and multiple clots in the urinary bladder.  Started on CBI and manual irrigation.  Taken urgently to operating room for cystoscopy.   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 Continue Foley catheter care.  Monitor JP drain. Possible blockage of right ureter, IR consulted for percutaneous nephrostomy tube. Hemoglobin 9 on presentation-8.5-7.4-6.6. 2 units of PRBC transfusion, hemoglobin 7.1.  With ongoing hematuria, will keep hemoglobin more than 8. 1 additional unit of PRBC today. Close monitoring. Further management as per urology. Was on antibiotic prophylaxis with Rocephin. Biopsy of the bladder mass pending, possible malignancy. Blood cultures negative Urine culture with 30,000 colonies of MRSA.  Discontinue Rocephin.  Will treat with vancomycin due to intraperitoneal rupture of the bladder until clinical improvement.  Does not need long-term antibiotics.  Acute blood loss anemia: Significant hematuria and intraperitoneal rupture of the bladder.  On third unit of PRBC today.  Monitor closely.  Hyponatremia: Secondary to beer potomania.  On isotonic fluid.  Sodium level is improving.  Close monitoring.  Alcoholism with alcohol use, risk of withdrawal syndrome: On CIWA  protocol.  Multivitamins.  Monitor closely.  Fall and delirium precautions.  Acute metabolic encephalopathy: Multifactorial.  Sedation, anesthesia, alcohol withdrawal.  Monitor closely.  Clinically improving.   DVT prophylaxis: SCDs Start: 09/08/22 2306   Code Status: Full code Family Communication: Significant other on the phone 5/27. Disposition Plan: Status is: Inpatient.  Remains inpatient.  Procedures.   Consultants:  Urology IR.  Procedures:  Cystoscopy, biopsy Percutaneous nephrostomy  Antimicrobials:  Rocephin 5/26---   Subjective: Patient seen and examined.  He is more awake today.  Able to keep up conversation.  Patient is hungry.  Waiting for IR procedure.  Does have mild pain suprapubic area.  Consented for blood transfusion. No other overnight events.  Objective: Vitals:   09/10/22 0920 09/10/22 0925 09/10/22 0930 09/10/22 0935  BP: (!) 132/91 (!) 141/89 139/88 (!) 151/76  Pulse: 98 99 99 (!) 102  Resp: (!) 24 20 (!) 22 20  Temp:      TempSrc:      SpO2: 96% 99% 99% 99%  Weight:      Height:        Intake/Output Summary (Last 24 hours) at 09/10/2022 0944 Last data filed at 09/10/2022 8119 Gross per 24 hour  Intake 1068 ml  Output 3890 ml  Net -2822 ml   Filed Weights   09/08/22 1601  Weight: 95.3 kg    Examination:  General: Chronically sick looking gentleman not in any distress. Cardiovascular: S1-S2 normal.  Regular rate rhythm.   Respiratory: Bilateral clear.  No added sounds. Gastrointestinal: Soft.  Tender on the lower abdomen, postop with JP drain draining serosanguineous discharge. Foley catheter with dark pink urine.  Free-flowing. Ext: No edema or cyanosis. Neuro:  Alert awake and oriented.  Moves all extremities.  Data Reviewed: I have personally reviewed following labs and imaging studies  CBC: Recent Labs  Lab 09/08/22 1650 09/09/22 0155 09/09/22 1243 09/09/22 2149 09/10/22 0058 09/10/22 0729  WBC 7.6 14.5* 12.9*  --   17.9*  --   NEUTROABS 6.1  --   --   --   --   --   HGB 9.0* 8.5* 7.4* 7.1* 6.8* 7.1*  HCT 28.6* 26.5* 23.4* 21.7* 21.0* 21.6*  MCV 98.3 95.7 97.9  --  96.8  --   PLT 112* 246 181  --  169  --    Basic Metabolic Panel: Recent Labs  Lab 09/08/22 1650 09/09/22 0155 09/10/22 0058  NA 126* 129* 132*  K 4.1 4.1 4.5  CL 94* 95* 103  CO2 20* 20* 23  GLUCOSE 103* 128* 151*  BUN 7* 8 11  CREATININE 0.83 0.97 1.20  CALCIUM 7.7* 7.9* 6.7*  MG  --   --  1.7  PHOS  --   --  2.9   GFR: Estimated Creatinine Clearance: 71.5 mL/min (by C-G formula based on SCr of 1.2 mg/dL). Liver Function Tests: Recent Labs  Lab 09/08/22 1650 09/10/22 0058  AST 46* 38  ALT 33 24  ALKPHOS 83 45  BILITOT 1.2 2.4*  PROT 6.0* 4.8*  ALBUMIN 3.1* 2.4*   No results for input(s): "LIPASE", "AMYLASE" in the last 168 hours. No results for input(s): "AMMONIA" in the last 168 hours. Coagulation Profile: Recent Labs  Lab 09/10/22 0058  INR 1.3*   Cardiac Enzymes: No results for input(s): "CKTOTAL", "CKMB", "CKMBINDEX", "TROPONINI" in the last 168 hours. BNP (last 3 results) No results for input(s): "PROBNP" in the last 8760 hours. HbA1C: No results for input(s): "HGBA1C" in the last 72 hours. CBG: Recent Labs  Lab 09/09/22 0417  GLUCAP 164*   Lipid Profile: No results for input(s): "CHOL", "HDL", "LDLCALC", "TRIG", "CHOLHDL", "LDLDIRECT" in the last 72 hours. Thyroid Function Tests: No results for input(s): "TSH", "T4TOTAL", "FREET4", "T3FREE", "THYROIDAB" in the last 72 hours. Anemia Panel: No results for input(s): "VITAMINB12", "FOLATE", "FERRITIN", "TIBC", "IRON", "RETICCTPCT" in the last 72 hours. Sepsis Labs: No results for input(s): "PROCALCITON", "LATICACIDVEN" in the last 168 hours.  Recent Results (from the past 240 hour(s))  Urine Culture     Status: Abnormal (Preliminary result)   Collection Time: 09/08/22  5:08 PM   Specimen: Urine, Catheterized  Result Value Ref Range Status    Specimen Description URINE, CATHETERIZED  Final   Special Requests   Final    NONE Performed at [facility], [address] [place], [place] [address]    Culture (A)  Final    30,000 COLONIES/mL METHICILLIN RESISTANT STAPHYLOCOCCUS AUREUS   Report Status PENDING  Incomplete   Organism ID, Bacteria METHICILLIN RESISTANT STAPHYLOCOCCUS AUREUS (A)  Final      Susceptibility   Methicillin resistant staphylococcus aureus - MIC*    CIPROFLOXACIN >=8 RESISTANT Resistant     GENTAMICIN <=0.5 SENSITIVE Sensitive     NITROFURANTOIN <=16 SENSITIVE Sensitive     OXACILLIN >=4 RESISTANT Resistant     TETRACYCLINE <=1 SENSITIVE Sensitive     VANCOMYCIN <=0.5 SENSITIVE Sensitive     TRIMETH/SULFA <=10 SENSITIVE Sensitive     CLINDAMYCIN <=0.25 SENSITIVE Sensitive     RIFAMPIN <=0.5 SENSITIVE Sensitive     Inducible Clindamycin NEGATIVE Sensitive     LINEZOLID 2 SENSITIVE Sensitive     * 30,000 COLONIES/mL  METHICILLIN RESISTANT STAPHYLOCOCCUS AUREUS         Radiology Studies: US RENAL  Result Date: 09/09/2022 CLINICAL DATA:  Bladder rupture secondary to bladder tumor and right hydronephrosis. EXAM: RENAL / URINARY TRACT ULTRASOUND COMPLETE COMPARISON:  CT of the abdomen and pelvis on 09/08/2022 FINDINGS: Right Kidney: Renal measurements: 11.8 x 7.4 x 5.1 cm = volume: 232 mL. Persistent severe right hydronephrosis. No solid masses identified. Left Kidney: Renal measurements: 10.6 x 5.7 x 7.0 cm = volume: 220 mL. Echogenicity within normal limits. No mass or hydronephrosis visualized. Bladder: The bladder was not imaged after recent exploratory laparotomy with bladder repair. Other: None. IMPRESSION: 1. Persistent severe right hydronephrosis. 2. Normal appearance of the left kidney. Electronically Signed   By: Irish Lack M.D.   On: 09/09/2022 13:11   DG C-Arm 1-60 Min  Result Date: 09/09/2022 CLINICAL DATA:  Surgery, cystoscopy. EXAM: DG C-ARM 1-60 MIN CONTRAST:  Not provided.  FLUOROSCOPY: Fluoroscopy Time:  18.3 seconds Radiation Exposure Index (if provided by the fluoroscopic device): 8.89 mGy Number of Acquired Spot Images: 2 COMPARISON:  CT yesterday FINDINGS: Two fluoroscopic spot views demonstrate contrast filling the presumed urinary bladder, extending laterally in the pelvis. Possible filling defect eccentrically in the bladder, recommend correlation with procedure report. IMPRESSION: Fluoroscopic spot views. Electronically Signed   By: Narda Rutherford M.D.   On: 09/09/2022 12:27   CT Renal Stone Study  Result Date: 09/08/2022 CLINICAL DATA:  Abdominal pain. EXAM: CT ABDOMEN AND PELVIS WITHOUT CONTRAST TECHNIQUE: Multidetector CT imaging of the abdomen and pelvis was performed following the standard protocol without IV contrast. RADIATION DOSE REDUCTION: This exam was performed according to the departmental dose-optimization program which includes automated exposure control, adjustment of the mA and/or kV according to patient size and/or use of iterative reconstruction technique. COMPARISON:  November 07, 2021 FINDINGS: Lower chest: Small left pleural effusion. Normal heart size calcific atherosclerotic disease of the coronary arteries and aorta. Hepatobiliary: No focal liver abnormality is seen. Status post cholecystectomy. No biliary dilatation. Pancreas: Unremarkable. No pancreatic ductal dilatation or surrounding inflammatory changes. Spleen: Normal in size without focal abnormality. Adrenals/Urinary Tract: Normal adrenal glands. Normal left kidney and left ureter. 1.8 cm indeterminate right renal mass, exophytic off of the posterior pole of the right kidney. Marked right hydronephrosis and hydroureter to the level of the vesicoureteral junction. The urinary bladder is markedly distended and contains what appears to be multiple blood clots. There are also gas bubbles within the urinary bladder. Urinary Foley has been placed. Stomach/Bowel: Stomach is within normal limits. No  evidence of appendicitis. No evidence of bowel wall thickening, distention, or inflammatory changes. Vascular/Lymphatic: Aortic atherosclerosis. No enlarged abdominal or pelvic lymph nodes. Reproductive: Mildly enlarged prostate gland. Other: No abdominal wall hernia or abnormality. No abdominopelvic ascites. Musculoskeletal: No acute or significant osseous findings. IMPRESSION: 1. Marked right hydronephrosis and hydroureter to the level of the vesicoureteral junction. The urinary bladder is markedly distended, to 21.9 cm, and contains what appears to be multiple blood clots. There are also gas bubbles within the urinary bladder. Urinary Foley has been placed. 2. 1.8 cm indeterminate right renal mass, exophytic off of the posterior pole of the right kidney. Further evaluation with renal protocol CT or MRI may be considered, when clinically appropriate. 3. Small left pleural effusion. 4. Aortic atherosclerosis. Aortic Atherosclerosis (ICD10-I70.0). Electronically Signed   By: Ted Mcalpine M.D.   On: 09/08/2022 17:53        Scheduled Meds:  sodium chloride  Intravenous Once   sodium chloride   Intravenous Once   Chlorhexidine Gluconate Cloth  6 each Topical Daily   folic acid  1 mg Oral Daily   multivitamin with minerals  1 tablet Oral Daily   thiamine  100 mg Oral Daily   Or   thiamine  100 mg Intravenous Daily   Continuous Infusions:  sodium chloride 100 mL/hr at 09/09/22 0620     LOS: 1 day    Time spent: 50 minutes    Dorcas Carrow, MD [facility] Pager [phone]

## 2022-09-10 NOTE — Progress Notes (Signed)
Pharmacy Antibiotic Note  Noah Rosales is a 61 y.o. male admitted on 09/08/2022 with  bladder rupture .  Pharmacy has been consulted for vancomycin dosing.  Plan: Vancomycin 1250mg  Q24H eAUC: 471 using IBW, Vd: 0.5, and Scr: 1.2. F/u bcx.  Monitor renal function for dose adjustments as indicated.    Height: 5\' 8"  (172.7 cm) Weight: 95.3 kg (210 lb) IBW/kg (Calculated) : 68.4  Temp (24hrs), Avg:98.9 F (37.2 C), Min:97 F (36.1 C), Max:100.7 F (38.2 C)  Recent Labs  Lab 09/08/22 1650 09/09/22 0155 09/09/22 1243 09/10/22 0058  WBC 7.6 14.5* 12.9* 17.9*  CREATININE 0.83 0.97  --  1.20    Estimated Creatinine Clearance: 71.5 mL/min (by C-G formula based on SCr of 1.2 mg/dL).    Allergies  Allergen Reactions   Sertraline Hcl Rash    Zoloft    Thank you for allowing pharmacy to be a part of this patient's care.  Estill Batten, PharmD, BCCCP  09/10/2022 10:02 AM

## 2022-09-10 NOTE — Progress Notes (Addendum)
Blood administration started. Pre-medications administered. Pt 15 min post blood administration observation and vital signs complete. Pt denies any symptoms. Pt stated he doesn't feel any different than before administration. RN will continue to monitor pt per protocol.  0500 Blood administration complete. Pt denies symptoms of reaction.

## 2022-09-11 DIAGNOSIS — R310 Gross hematuria: Secondary | ICD-10-CM | POA: Diagnosis not present

## 2022-09-11 LAB — CBC WITH DIFFERENTIAL/PLATELET
Abs Immature Granulocytes: 0.19 10*3/uL — ABNORMAL HIGH (ref 0.00–0.07)
Basophils Absolute: 0 10*3/uL (ref 0.0–0.1)
Basophils Relative: 0 %
Eosinophils Absolute: 0 10*3/uL (ref 0.0–0.5)
Eosinophils Relative: 0 %
HCT: 24.4 % — ABNORMAL LOW (ref 39.0–52.0)
Hemoglobin: 8 g/dL — ABNORMAL LOW (ref 13.0–17.0)
Immature Granulocytes: 1 %
Lymphocytes Relative: 6 %
Lymphs Abs: 1 10*3/uL (ref 0.7–4.0)
MCH: 30.7 pg (ref 26.0–34.0)
MCHC: 32.8 g/dL (ref 30.0–36.0)
MCV: 93.5 fL (ref 80.0–100.0)
Monocytes Absolute: 1.1 10*3/uL — ABNORMAL HIGH (ref 0.1–1.0)
Monocytes Relative: 7 %
Neutro Abs: 13.8 10*3/uL — ABNORMAL HIGH (ref 1.7–7.7)
Neutrophils Relative %: 86 %
Platelets: 190 10*3/uL (ref 150–400)
RBC: 2.61 MIL/uL — ABNORMAL LOW (ref 4.22–5.81)
RDW: 18.6 % — ABNORMAL HIGH (ref 11.5–15.5)
WBC: 16.1 10*3/uL — ABNORMAL HIGH (ref 4.0–10.5)
nRBC: 0 % (ref 0.0–0.2)

## 2022-09-11 LAB — TYPE AND SCREEN
Unit division: 0
Unit division: 0
Unit division: 0

## 2022-09-11 LAB — BASIC METABOLIC PANEL
Anion gap: 7 (ref 5–15)
BUN: 9 mg/dL (ref 8–23)
CO2: 26 mmol/L (ref 22–32)
Calcium: 7.3 mg/dL — ABNORMAL LOW (ref 8.9–10.3)
Chloride: 106 mmol/L (ref 98–111)
Creatinine, Ser: 0.97 mg/dL (ref 0.61–1.24)
GFR, Estimated: >60 mL/min (ref >60)
Glucose, Bld: 94 mg/dL (ref 70–99)
Potassium: 3.5 mmol/L (ref 3.5–5.1)
Sodium: 139 mmol/L (ref 135–145)

## 2022-09-11 LAB — BPAM RBC
Blood Product Expiration Date: 202405302359
Blood Product Expiration Date: 202405312359
ISSUE DATE / TIME: 202405280239
ISSUE DATE / TIME: 202405281030
Unit Type and Rh: 9500

## 2022-09-11 LAB — CULTURE, BLOOD (ROUTINE X 2): Culture: NO GROWTH

## 2022-09-11 LAB — MAGNESIUM: Magnesium: 2.1 mg/dL (ref 1.7–2.4)

## 2022-09-11 LAB — SURGICAL PATHOLOGY

## 2022-09-11 MED ORDER — POLYETHYLENE GLYCOL 3350 17 G PO PACK
17.0000 g | PACK | Freq: Every day | ORAL | Status: DC
  Filled 2022-09-11 (×3): qty 1

## 2022-09-11 MED ORDER — VANCOMYCIN HCL 750 MG/150ML IV SOLN
750.0000 mg | Freq: Two times a day (BID) | INTRAVENOUS | Status: DC
  Administered 2022-09-12 – 2022-09-13 (×3): 750 mg via INTRAVENOUS
  Filled 2022-09-11 (×4): qty 150

## 2022-09-11 MED ORDER — SENNA 8.6 MG PO TABS
1.0000 | ORAL_TABLET | Freq: Two times a day (BID) | ORAL | Status: DC
  Administered 2022-09-11: 8.6 mg via ORAL
  Filled 2022-09-11 (×3): qty 1

## 2022-09-11 MED ORDER — HYDROMORPHONE HCL 1 MG/ML IJ SOLN: 1.0000 mg | Freq: Once | INTRAMUSCULAR | Status: DC

## 2022-09-11 NOTE — Progress Notes (Addendum)
Referring Physician(s): Joesph July Winter, C  Supervising Physician: Simonne Come  Patient Status:  [facility] - In-pt  Chief Complaint:  Ureteral obstruction Rt PCN placed in IR 5/28  Subjective:  Pt eating some lunch Feels some better today OP from R PCN is minimally blood tinged Site with some drainage at dressing-- changed at bedside  Allergies: Sertraline hcl  Medications: Prior to Admission medications   Not on File     Vital Signs: BP (!) 146/82 (BP Location: Right Arm)   Pulse 91   Temp 98.4 F (36.9 C) (Oral)   Resp 16   Ht 5\' 8"  (1.727 m)   Wt 210 lb (95.3 kg)   SpO2 91%   BMI 31.93 kg/m   Physical Exam Vitals reviewed.  Skin:    General: Skin is warm.     Comments: Clean and dry Dressing changed  No sign of infection  Great OP -- minimally blood tinged   Neurological:     Mental Status: He is alert and oriented to person, place, and time.  Psychiatric:        Behavior: Behavior normal.     Imaging: IR NEPHROSTOMY PLACEMENT RIGHT  Result Date: 09/10/2022 INDICATION: Hydronephrosis.  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 EXAM: ULTRASOUND AND FLUOROSCOPIC GUIDED PLACEMENT OF RIGHT NEPHROSTOMY TUBE COMPARISON:  US Renal, 09/09/2022.  CT AP, 09/08/2022. MEDICATIONS: Rocephin 2 gm IV; The antibiotic was administered in an appropriate time frame prior to skin puncture. ANESTHESIA/SEDATION: Moderate (conscious) sedation was employed during this procedure. A total of Versed 1 mg and Fentanyl 50 mcg was administered intravenously. Moderate Sedation Time: 13 minutes. The patient's level of consciousness and vital signs were monitored continuously by radiology nursing throughout the procedure under my direct supervision. CONTRAST:  10 mL Isovue 300 - administered into the renal collecting system FLUOROSCOPY TIME:  Fluoroscopic dose; 2 mGy COMPLICATIONS: None immediate. PROCEDURE: The procedure, risks, benefits, and alternatives were explained to the  patient and/or patient's representative, questions were encouraged and answered and informed consent was obtained. A timeout was performed prior to the initiation of the procedure. The operative site was prepped and draped in the usual sterile fashion and a sterile drape was applied covering the operative field. A sterile gown and sterile gloves were used for the procedure. Local anesthesia was provided with 1% Lidocaine with epinephrine. Ultrasound was used to localize the RIGHT kidney. Under direct ultrasound guidance, a 20 gauge needle was advanced into the renal collecting system. An ultrasound image documentation was performed. Access within the collecting system was confirmed with the efflux of urine followed by limited contrast injection. Under intermittent fluoroscopic guidance, an 0.018 wire was advanced into the collecting system and the tract was dilated with an Accustick stent. Next, over a short Amplatz wire, the track was further dilated ultimately allowing placement of a 10 Fr nephrostomy catheter with end coiled and locked within the renal pelvis. Contrast was injected and several spot fluoroscopic images were obtained in various obliquities. The catheter was secured at the skin entrance site with an interrupted suture and a stat lock device and connected to a gravity bag. Dressings were applied. The patient tolerated procedure well without immediate postprocedural complication. FINDINGS: Ultrasound scanning demonstrates a moderate-to-severe dilated RIGHT collecting system. Under a combination of ultrasound and fluoroscopic guidance, a posterior inferior calix was targeted allowing placement of a 10 Fr percutaneous nephrostomy catheter with end coiled and locked within the renal pelvis. Contrast injection confirmed appropriate positioning. IMPRESSION: Successful placement  of a RIGHT sided 10 Fr percutaneous nephrostomy drainage catheter. RECOMMENDATIONS: The patient will return to Vascular  Interventional Radiology (VIR) for routine drainage catheter evaluation and exchange in 6-8 weeks. Roanna Banning, MD [facility] Radiology Electronically Signed   By: Roanna Banning M.D.   On: 09/10/2022 10:22   US RENAL  Result Date: 09/09/2022 CLINICAL DATA:  Bladder rupture secondary to bladder tumor and right hydronephrosis. EXAM: RENAL / URINARY TRACT ULTRASOUND COMPLETE COMPARISON:  CT of the abdomen and pelvis on 09/08/2022 FINDINGS: Right Kidney: Renal measurements: 11.8 x 7.4 x 5.1 cm = volume: 232 mL. Persistent severe right hydronephrosis. No solid masses identified. Left Kidney: Renal measurements: 10.6 x 5.7 x 7.0 cm = volume: 220 mL. Echogenicity within normal limits. No mass or hydronephrosis visualized. Bladder: The bladder was not imaged after recent exploratory laparotomy with bladder repair. Other: None. IMPRESSION: 1. Persistent severe right hydronephrosis. 2. Normal appearance of the left kidney. Electronically Signed   By: Irish Lack M.D.   On: 09/09/2022 13:11   DG C-Arm 1-60 Min  Result Date: 09/09/2022 CLINICAL DATA:  Surgery, cystoscopy. EXAM: DG C-ARM 1-60 MIN CONTRAST:  Not provided. FLUOROSCOPY: Fluoroscopy Time:  18.3 seconds Radiation Exposure Index (if provided by the fluoroscopic device): 8.89 mGy Number of Acquired Spot Images: 2 COMPARISON:  CT yesterday FINDINGS: Two fluoroscopic spot views demonstrate contrast filling the presumed urinary bladder, extending laterally in the pelvis. Possible filling defect eccentrically in the bladder, recommend correlation with procedure report. IMPRESSION: Fluoroscopic spot views. Electronically Signed   By: Narda Rutherford M.D.   On: 09/09/2022 12:27   CT Renal Stone Study  Result Date: 09/08/2022 CLINICAL DATA:  Abdominal pain. EXAM: CT ABDOMEN AND PELVIS WITHOUT CONTRAST TECHNIQUE: Multidetector CT imaging of the abdomen and pelvis was performed following the standard protocol  without IV contrast. RADIATION DOSE REDUCTION: This exam was performed according to the departmental dose-optimization program which includes automated exposure control, adjustment of the mA and/or kV according to patient size and/or use of iterative reconstruction technique. COMPARISON:  November 07, 2021 FINDINGS: Lower chest: Small left pleural effusion. Normal heart size calcific atherosclerotic disease of the coronary arteries and aorta. Hepatobiliary: No focal liver abnormality is seen. Status post cholecystectomy. No biliary dilatation. Pancreas: Unremarkable. No pancreatic ductal dilatation or surrounding inflammatory changes. Spleen: Normal in size without focal abnormality. Adrenals/Urinary Tract: Normal adrenal glands. Normal left kidney and left ureter. 1.8 cm indeterminate right renal mass, exophytic off of the posterior pole of the right kidney. Marked right hydronephrosis and hydroureter to the level of the vesicoureteral junction. The urinary bladder is markedly distended and contains what appears to be multiple blood clots. There are also gas bubbles within the urinary bladder. Urinary Foley has been placed. Stomach/Bowel: Stomach is within normal limits. No evidence of appendicitis. No evidence of bowel wall thickening, distention, or inflammatory changes. Vascular/Lymphatic: Aortic atherosclerosis. No enlarged abdominal or pelvic lymph nodes. Reproductive: Mildly enlarged prostate gland. Other: No abdominal wall hernia or abnormality. No abdominopelvic ascites. Musculoskeletal: No acute or significant osseous findings. IMPRESSION: 1. Marked right hydronephrosis and hydroureter to the level of the vesicoureteral junction. The urinary bladder is markedly distended, to 21.9 cm, and contains what appears to be multiple blood clots. There are also gas bubbles within the urinary bladder. Urinary Foley has been placed. 2. 1.8 cm indeterminate right renal mass, exophytic off of the posterior pole of the right  kidney. Further evaluation with renal protocol CT or MRI may  be considered, when clinically appropriate. 3. Small left pleural effusion. 4. Aortic atherosclerosis. Aortic Atherosclerosis (ICD10-I70.0). Electronically Signed   By: Ted Mcalpine M.D.   On: 09/08/2022 17:53    Labs:  CBC: Recent Labs    09/09/22 0155 09/09/22 1243 09/09/22 2149 09/10/22 0058 09/10/22 0729 09/10/22 1527 09/11/22 0221  WBC 14.5* 12.9*  --  17.9*  --   --  16.1*  HGB 8.5* 7.4*   < > 6.8* 7.1* 8.3* 8.0*  HCT 26.5* 23.4*   < > 21.0* 21.6* 25.7* 24.4*  PLT 246 181  --  169  --   --  190   < > = values in this interval not displayed.    COAGS: Recent Labs    09/10/22 0058  INR 1.3*    BMP: Recent Labs    09/08/22 1650 09/09/22 0155 09/10/22 0058 09/11/22 0221  NA 126* 129* 132* 139  K 4.1 4.1 4.5 3.5  CL 94* 95* 103 106  CO2 20* 20* 23 26  GLUCOSE 103* 128* 151* 94  BUN 7* 8 11 9   CALCIUM 7.7* 7.9* 6.7* 7.3*  CREATININE 0.83 0.97 1.20 0.97  GFRNONAA >60 >60 >60 >60    LIVER FUNCTION TESTS: Recent Labs    11/07/21 1732 11/08/21 0439 09/08/22 1650 09/10/22 0058  BILITOT 1.2 1.6* 1.2 2.4*  AST 48* 40 46* 38  ALT 40 37 33 24  ALKPHOS 87 77 83 45  PROT 6.9 6.6 6.0* 4.8*  ALBUMIN 3.6 3.5 3.1* 2.4*   Drain Location: CVA right Size: Fr size: 10 Fr Date of placement: 09/10/22  Currently to: Drain collection device: gravity 24 hour output:  Output by Drain (mL) 09/09/22 0701 - 09/09/22 1900 09/09/22 1901 - 09/10/22 0700 09/10/22 0701 - 09/10/22 1900 09/10/22 1901 - 09/11/22 0700 09/11/22 0701 - 09/11/22 1338  Closed System Drain 1 Right;Anterior;Distal RLQ Bulb (JP) 22 Fr. 355 135 40 165 75    Interval imaging/drain manipulation:  None  Current examination:  Insertion site unremarkable. Suture and stat lock in place. Dressed appropriately. -- sl blood tinged fluid noted on dressing; new dressing placed  Plan: Flush PCN daily - 10 cc sterile saline Record output Q  shift. Dressing changes QD or PRN if soiled.  Call IR APP or on call IR MD if difficulty flushing or sudden change in drain output.  .  IR will continue to follow - please call with questions or concerns.  Assessment and Plan:  Follow with Urology Urologist-Notify IR if any needs--- exchange as OP   Electronically Signed: Robet Leu, PA-C 09/11/2022, 1:38 PM   I spent a total of 15 Minutes at the the patient's bedside AND on the patient's hospital floor or unit, greater than 50% of which was counseling/coordinating care for R PCN

## 2022-09-11 NOTE — Progress Notes (Signed)
Patient placed on 2L O2 to maintain sats>92% 

## 2022-09-11 NOTE — Progress Notes (Signed)
2 Days Post-Op Subjective: NAEON. Pt transfused yesterday with ongoing hematuria. Requesting food.   Objective: Vital signs in last 24 hours: Temp:  [98.5 F (36.9 C)-98.7 F (37.1 C)] 98.5 F (36.9 C) (05/29 0744) Pulse Rate:  [87-97] 97 (05/29 0744) Resp:  [14-19] 16 (05/29 0744) BP: (117-155)/(63-88) 155/88 (05/29 0744) SpO2:  [91 %-100 %] 100 % (05/29 0744)  Intake/Output from previous day: 05/28 0701 - 05/29 0700 In: 765 [Blood:315; IV Piggyback:450] Out: 2925 [Urine:2720; Drains:205]  Intake/Output this shift: Total I/O In: -  Out: 325 [Urine:250; Drains:75]  Physical Exam:  General: Alert and oriented CV: No cyanosis Lungs: equal chest rise Abdomen: Soft, NTND, no rebound or guarding Skin: Gu:  3 way hematuria cath in place. Medium red urine  Lab Results: Recent Labs    09/10/22 0729 09/10/22 1527 09/11/22 0221  HGB 7.1* 8.3* 8.0*  HCT 21.6* 25.7* 24.4*   BMET Recent Labs    09/10/22 0058 09/11/22 0221  NA 132* 139  K 4.5 3.5  CL 103 106  CO2 23 26  GLUCOSE 151* 94  BUN 11 9  CREATININE 1.20 0.97  CALCIUM 6.7* 7.3*     Studies/Results: IR NEPHROSTOMY PLACEMENT RIGHT  Result Date: 09/10/2022 INDICATION: Hydronephrosis.  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 EXAM: ULTRASOUND AND FLUOROSCOPIC GUIDED PLACEMENT OF RIGHT NEPHROSTOMY TUBE COMPARISON:  US Renal, 09/09/2022.  CT AP, 09/08/2022. MEDICATIONS: Rocephin 2 gm IV; The antibiotic was administered in an appropriate time frame prior to skin puncture. ANESTHESIA/SEDATION: Moderate (conscious) sedation was employed during this procedure. A total of Versed 1 mg and Fentanyl 50 mcg was administered intravenously. Moderate Sedation Time: 13 minutes. The patient's level of consciousness and vital signs were monitored continuously by radiology nursing throughout the procedure under my direct supervision. CONTRAST:  10 mL Isovue 300 - administered into the renal collecting system FLUOROSCOPY TIME:   Fluoroscopic dose; 2 mGy COMPLICATIONS: None immediate. PROCEDURE: The procedure, risks, benefits, and alternatives were explained to the patient and/or patient's representative, questions were encouraged and answered and informed consent was obtained. A timeout was performed prior to the initiation of the procedure. The operative site was prepped and draped in the usual sterile fashion and a sterile drape was applied covering the operative field. A sterile gown and sterile gloves were used for the procedure. Local anesthesia was provided with 1% Lidocaine with epinephrine. Ultrasound was used to localize the RIGHT kidney. Under direct ultrasound guidance, a 20 gauge needle was advanced into the renal collecting system. An ultrasound image documentation was performed. Access within the collecting system was confirmed with the efflux of urine followed by limited contrast injection. Under intermittent fluoroscopic guidance, an 0.018 wire was advanced into the collecting system and the tract was dilated with an Accustick stent. Next, over a short Amplatz wire, the track was further dilated ultimately allowing placement of a 10 Fr nephrostomy catheter with end coiled and locked within the renal pelvis. Contrast was injected and several spot fluoroscopic images were obtained in various obliquities. The catheter was secured at the skin entrance site with an interrupted suture and a stat lock device and connected to a gravity bag. Dressings were applied. The patient tolerated procedure well without immediate postprocedural complication. FINDINGS: Ultrasound scanning demonstrates a moderate-to-severe dilated RIGHT collecting system. Under a combination of ultrasound and fluoroscopic guidance, a posterior inferior calix was targeted allowing placement of a 10 Fr percutaneous nephrostomy catheter with end coiled and locked within the renal pelvis. Contrast injection confirmed appropriate positioning.  IMPRESSION: Successful  placement of a RIGHT sided 10 Fr percutaneous nephrostomy drainage catheter. RECOMMENDATIONS: The patient will return to Vascular Interventional Radiology (VIR) for routine drainage catheter evaluation and exchange in 6-8 weeks. Roanna Banning, MD [facility] Radiology Electronically Signed   By: Roanna Banning M.D.   On: 09/10/2022 10:22   US RENAL  Result Date: 09/09/2022 CLINICAL DATA:  Bladder rupture secondary to bladder tumor and right hydronephrosis. EXAM: RENAL / URINARY TRACT ULTRASOUND COMPLETE COMPARISON:  CT of the abdomen and pelvis on 09/08/2022 FINDINGS: Right Kidney: Renal measurements: 11.8 x 7.4 x 5.1 cm = volume: 232 mL. Persistent severe right hydronephrosis. No solid masses identified. Left Kidney: Renal measurements: 10.6 x 5.7 x 7.0 cm = volume: 220 mL. Echogenicity within normal limits. No mass or hydronephrosis visualized. Bladder: The bladder was not imaged after recent exploratory laparotomy with bladder repair. Other: None. IMPRESSION: 1. Persistent severe right hydronephrosis. 2. Normal appearance of the left kidney. Electronically Signed   By: Irish Lack M.D.   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 UOP.  Moderate hematuria.  Cherry red with no clot accumulation noted. Hand irrigated close to a liter. Returned around [employer] of clot material. Otherwise light pink. Would prefer at least 2 weeks bladder rest prior to surgical intervention. Risk v. Benefit may change with recurrent hemorrhage.  Nursing to hand irrigate as needed.   #Bladder Mass #Hydronephrosis  Right percutaneous nephrostomy tube placed yesterday d/t ureteral obstruction. Clear yellow urine mildly blood tinged, scant mucus stranding.  Path report high grade UC with  muscle invasion. May ultimately require cystectomy. Will discuss treatment options outpatient    LOS: 2 days   Elmon Kirschner, NP [facility] Pager: [phone]  09/11/2022, 10:54 AM

## 2022-09-11 NOTE — Progress Notes (Signed)
Emptied nephrostomy bag. At shift change, urine was yellow and clear. When emptying the bag, urine was bloody. Checked site. No active bleeding from insertion site.

## 2022-09-11 NOTE — Progress Notes (Signed)
PROGRESS NOTE  Noah Rosales  ZOX:[id number] DOB: [birth year] DOA: 09/08/2022 PCP: Pcp, No   Brief Narrative: Patient is a 61 year old male with history of alcoholism, coronary artery disease, status post CABG who presented with flank pain, gross hematuria for 1 day.  He was trying to drink beer to pass kidney stones.  In the Emergency Department, he was found to be hemodynamically stable.  Noted to have gross hematuria with multiple clots in the urinary bladder.  Started on CBI and manual irrigation.  Status post right nephrostomy tube placement by urology on 5/28.  Hospitalist remarkable for severe anemia secondary to hematuria requiring blood transfusion.  Urine culture showing MRSA  Assessment & Plan:  Principal Problem:   Gross hematuria Active Problems:   Hyponatremia   Bladder mass   Alcohol abuse   Hematuria  Gross hematuria secondary to papillary bladder tumor/severe right-sided hydronephrosis/intraperitoneal bladder rupture: Presented with flank pain, gross hematuria.  Foley placed.  Urology consulted.  Status post expiratory laparotomy for intraperitoneal bladder rupture secondary to clot retention from.  Bladder mass concerning for urothelial carcinoma.  Status post right nephrostomy tube placement by urology on 5/28.  Surgical pathology showed high-grade papillary urothelial carcinoma.  Kidney function normal.  Normocytic anemia: Secondary to severe hematuria.  Total of 3 units of blood transfusion.  Hemoglobin of 8 today.  Continue monitoring.  MRSA UTI: Urine culture showing [id number] colonies of MRSA.  Currently on vancomycin.  Leukocytosis: Continue current antibiotics.  Monitor.  Blood culture has not shown any growth.  Patient is afebrile.  History of coronary artery disease: Status post CABG.Apparently not taking any meds..  No  anginal symptoms  Hyponatremia: Secondary to beer potomania.  Continue monitoring  Chronic alcoholism: On CIWA protocol.  On folic acid and  thiamine         DVT prophylaxis:SCDs Start: 09/08/22 2306     Code Status: Full Code  Family Communication: None at bedside  Patient status:Inpatient  Patient is from :home  Anticipated discharge YN:WGNF  Estimated DC date:not sure   Consultants: Urology  Procedures:cystoscopy  Antimicrobials:  Anti-infectives (From admission, onward)    Start     Dose/Rate Route Frequency Ordered Stop   09/11/22 1000  cefTRIAXone (ROCEPHIN) 1 g in sodium chloride 0.9 % 100 mL IVPB  Status:  Discontinued        1 g 200 mL/hr over 30 Minutes Intravenous Every 24 hours 09/10/22 0908 09/10/22 0944   09/10/22 1100  vancomycin (VANCOREADY) IVPB 1250 mg/250 mL        1,250 mg 166.7 mL/hr over 90 Minutes Intravenous Every 24 hours 09/10/22 1001     09/10/22 1000  cefTRIAXone (ROCEPHIN) 2 g in sodium chloride 0.9 % 100 mL IVPB  Status:  Discontinued       Note to Pharmacy: Will be given in radiology, do not send to floor   2 g 200 mL/hr over 30 Minutes Intravenous  Once 09/10/22 0905 09/10/22 0957   09/09/22 2200  cefTRIAXone (ROCEPHIN) 1 g in sodium chloride 0.9 % 100 mL IVPB  Status:  Discontinued        1 g 200 mL/hr over 30 Minutes Intravenous Every 24 hours 09/08/22 2255 09/10/22 0908   09/09/22 1000  cefTRIAXone (ROCEPHIN) 1 g in dextrose 5 % 50 mL IVPB  Status:  Discontinued        1 g 120 mL/hr over 30 Minutes Intravenous  Once 09/09/22 0910 09/09/22 0912   09/08/22 1830  cefTRIAXone (ROCEPHIN) 1  g in sodium chloride 0.9 % 100 mL IVPB        1 g 200 mL/hr over 30 Minutes Intravenous  Once 09/08/22 1823 09/08/22 1940       Subjective: Patient seen and examined at the bedside today.  Hemodynamically stable.  Foley still draining sanguinous urine.  Lying in bed.  He complains of lower abdominal pain near the surgical wound.  No nausea or vomiting.  Objective: Vitals:   09/10/22 2047 09/10/22 2300 09/11/22 0300 09/11/22 0744  BP: 134/78 126/83 134/70 (!) 155/88  Pulse: 97 96  91 97  Resp: 19 16 14 16   Temp:  98.7 F (37.1 C) 98.6 F (37 C) 98.5 F (36.9 C)  TempSrc:  Oral Axillary Oral  SpO2: 92% 91% 95% 100%  Weight:      Height:        Intake/Output Summary (Last 24 hours) at 09/11/2022 1052 Last data filed at 09/11/2022 0855 Gross per 24 hour  Intake 565 ml  Output 2790 ml  Net -2225 ml   Filed Weights   09/08/22 1601  Weight: 95.3 kg    Examination:  General exam: Overall comfortable, not in distress, obese HEENT: PERRL Respiratory system:  no wheezes or crackles  Cardiovascular system: S1 & S2 heard, RRR.  Gastrointestinal system: Abdomen is nondistended, soft .  Lower abdominal surgical wound covered with dressing Central nervous system: Alert and oriented Extremities: No edema, no clubbing ,no cyanosis Skin: No rashes, no ulcers,no icterus   GU: Foley with sanguinous urine   Data Reviewed: I have personally reviewed following labs and imaging studies  CBC: Recent Labs  Lab 09/08/22 1650 09/09/22 0155 09/09/22 1243 09/09/22 2149 09/10/22 0058 09/10/22 0729 09/10/22 1527 09/11/22 0221  WBC 7.6 14.5* 12.9*  --  17.9*  --   --  16.1*  NEUTROABS 6.1  --   --   --   --   --   --  13.8*  HGB 9.0* 8.5* 7.4* 7.1* 6.8* 7.1* 8.3* 8.0*  HCT 28.6* 26.5* 23.4* 21.7* 21.0* 21.6* 25.7* 24.4*  MCV 98.3 95.7 97.9  --  96.8  --   --  93.5  PLT 112* 246 181  --  169  --   --  190   Basic Metabolic Panel: Recent Labs  Lab 09/08/22 1650 09/09/22 0155 09/10/22 0058 09/11/22 0221  NA 126* 129* 132* 139  K 4.1 4.1 4.5 3.5  CL 94* 95* 103 106  CO2 20* 20* 23 26  GLUCOSE 103* 128* 151* 94  BUN 7* 8 11 9   CREATININE 0.83 0.97 1.20 0.97  CALCIUM 7.7* 7.9* 6.7* 7.3*  MG  --   --  1.7 2.1  PHOS  --   --  2.9  --      Recent Results (from the past 240 hour(s))  Urine Culture     Status: Abnormal   Collection Time: 09/08/22  5:08 PM   Specimen: Urine, Catheterized  Result Value Ref Range Status   Specimen Description URINE,  CATHETERIZED  Final   Special Requests NONE  Final   Culture (A)  Final    30,000 COLONIES/mL METHICILLIN RESISTANT STAPHYLOCOCCUS AUREUS 10,000 COLONIES/mL LACTOBACILLUS SPECIES Standardized susceptibility testing for this organism is not available. Performed at [facility], [address] [place], [place] [address]    Report Status 09/10/2022 FINAL  Final   Organism ID, Bacteria METHICILLIN RESISTANT STAPHYLOCOCCUS AUREUS (A)  Final      Susceptibility  Methicillin resistant staphylococcus aureus - MIC*    CIPROFLOXACIN >=8 RESISTANT Resistant     GENTAMICIN <=0.5 SENSITIVE Sensitive     NITROFURANTOIN <=16 SENSITIVE Sensitive     OXACILLIN >=4 RESISTANT Resistant     TETRACYCLINE <=1 SENSITIVE Sensitive     VANCOMYCIN <=0.5 SENSITIVE Sensitive     TRIMETH/SULFA <=10 SENSITIVE Sensitive     CLINDAMYCIN <=0.25 SENSITIVE Sensitive     RIFAMPIN <=0.5 SENSITIVE Sensitive     Inducible Clindamycin NEGATIVE Sensitive     LINEZOLID 2 SENSITIVE Sensitive     * 30,000 COLONIES/mL METHICILLIN RESISTANT STAPHYLOCOCCUS AUREUS  Culture, blood (Routine X 2) w Reflex to ID Panel     Status: None (Preliminary result)   Collection Time: 09/10/22 10:28 AM   Specimen: BLOOD RIGHT ARM  Result Value Ref Range Status   Specimen Description BLOOD RIGHT ARM  Final   Special Requests   Final    BOTTLES DRAWN AEROBIC AND ANAEROBIC Blood Culture adequate volume   Culture   Final    NO GROWTH < 24 HOURS Performed at [facility], [address] [place], [place] [address]    Report Status PENDING  Incomplete  Culture, blood (Routine X 2) w Reflex to ID Panel     Status: None (Preliminary result)   Collection Time: 09/10/22 10:28 AM   Specimen: BLOOD  Result Value Ref Range Status   Specimen Description BLOOD SITE NOT SPECIFIED  Final   Special Requests   Final    BOTTLES DRAWN AEROBIC AND ANAEROBIC Blood Culture adequate volume   Culture   Final    NO GROWTH < 24 HOURS Performed at  [facility], [address], [place], [place] [address]    Report Status PENDING  Incomplete     Radiology Studies: IR NEPHROSTOMY PLACEMENT RIGHT  Result Date: 09/10/2022 INDICATION: Hydronephrosis.  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 EXAM: ULTRASOUND AND FLUOROSCOPIC GUIDED PLACEMENT OF RIGHT NEPHROSTOMY TUBE COMPARISON:  US Renal, 09/09/2022.  CT AP, 09/08/2022. MEDICATIONS: Rocephin 2 gm IV; The antibiotic was administered in an appropriate time frame prior to skin puncture. ANESTHESIA/SEDATION: Moderate (conscious) sedation was employed during this procedure. A total of Versed 1 mg and Fentanyl 50 mcg was administered intravenously. Moderate Sedation Time: 13 minutes. The patient's level of consciousness and vital signs were monitored continuously by radiology nursing throughout the procedure under my direct supervision. CONTRAST:  10 mL Isovue 300 - administered into the renal collecting system FLUOROSCOPY TIME:  Fluoroscopic dose; 2 mGy COMPLICATIONS: None immediate. PROCEDURE: The procedure, risks, benefits, and alternatives were explained to the patient and/or patient's representative, questions were encouraged and answered and informed consent was obtained. A timeout was performed prior to the initiation of the procedure. The operative site was prepped and draped in the usual sterile fashion and a sterile drape was applied covering the operative field. A sterile gown and sterile gloves were used for the procedure. Local anesthesia was provided with 1% Lidocaine with epinephrine. Ultrasound was used to localize the RIGHT kidney. Under direct ultrasound guidance, a 20 gauge needle was advanced into the renal collecting system. An ultrasound image documentation was performed. Access within the collecting system was confirmed with the efflux of urine followed by limited contrast injection. Under intermittent fluoroscopic guidance, an 0.018 wire was advanced into the collecting system and  the tract was dilated with an Accustick stent. Next, over a short Amplatz wire, the track was further dilated ultimately allowing placement of a  10 Fr nephrostomy catheter with end coiled and locked within the renal pelvis. Contrast was injected and several spot fluoroscopic images were obtained in various obliquities. The catheter was secured at the skin entrance site with an interrupted suture and a stat lock device and connected to a gravity bag. Dressings were applied. The patient tolerated procedure well without immediate postprocedural complication. FINDINGS: Ultrasound scanning demonstrates a moderate-to-severe dilated RIGHT collecting system. Under a combination of ultrasound and fluoroscopic guidance, a posterior inferior calix was targeted allowing placement of a 10 Fr percutaneous nephrostomy catheter with end coiled and locked within the renal pelvis. Contrast injection confirmed appropriate positioning. IMPRESSION: Successful placement of a RIGHT sided 10 Fr percutaneous nephrostomy drainage catheter. RECOMMENDATIONS: The patient will return to Vascular Interventional Radiology (VIR) for routine drainage catheter evaluation and exchange in 6-8 weeks. Roanna Banning, MD [facility] Radiology Electronically Signed   By: Roanna Banning M.D.   On: 09/10/2022 10:22   US RENAL  Result Date: 09/09/2022 CLINICAL DATA:  Bladder rupture secondary to bladder tumor and right hydronephrosis. EXAM: RENAL / URINARY TRACT ULTRASOUND COMPLETE COMPARISON:  CT of the abdomen and pelvis on 09/08/2022 FINDINGS: Right Kidney: Renal measurements: 11.8 x 7.4 x 5.1 cm = volume: 232 mL. Persistent severe right hydronephrosis. No solid masses identified. Left Kidney: Renal measurements: 10.6 x 5.7 x 7.0 cm = volume: 220 mL. Echogenicity within normal limits. No mass or hydronephrosis visualized. Bladder: The bladder was not imaged after recent exploratory laparotomy with bladder  repair. Other: None. IMPRESSION: 1. Persistent severe right hydronephrosis. 2. Normal appearance of the left kidney. Electronically Signed   By: Irish Lack M.D.   On: 09/09/2022 13:11    Scheduled Meds:  sodium chloride   Intravenous Once   Chlorhexidine Gluconate Cloth  6 each Topical Daily   folic acid  1 mg Oral Daily   multivitamin with minerals  1 tablet Oral Daily   sodium chloride flush  5 mL Intracatheter Q8H   thiamine  100 mg Oral Daily   Or   thiamine  100 mg Intravenous Daily   Continuous Infusions:  sodium chloride 100 mL/hr at 09/10/22 2217   vancomycin 1,250 mg (09/10/22 1250)     LOS: 2 days   Burnadette Pop, MD [facility] P5/29/2024, 10:52 AM

## 2022-09-11 NOTE — Plan of Care (Signed)
  Problem: Elimination: Goal: Will not experience complications related to urinary retention Outcome: Not Progressing   

## 2022-09-11 NOTE — Progress Notes (Signed)
Notified Dr. Imogene Burn of patient's increase of bloody drainage (change from shift change, urine was yellow at 7p) from nephrostomy tube and JP drain.   Will continue to monitor

## 2022-09-11 NOTE — Progress Notes (Signed)
Pharmacy Antibiotic Note  Noah Rosales is a 62 y.o. male admitted on 09/08/2022 with  bladder rupture and bladder mass s/p nephrostomy tube.  Pharmacy has been consulted for vancomycin dosing.  5/29 Vancomycin 750mg  Q 12 hr Scr used: 0.97 mg/dL Weight: 09.8 kg Vd coeff: 0.5 L/kg Est AUC: 464  Plan: Change to Vancomycin 750mg  Q24H Monitor cultures, clinical status, renal function, vancomycin level Narrow abx as able and f/u duration     Height: 5\' 8"  (172.7 cm) Weight: 95.3 kg (210 lb) IBW/kg (Calculated) : 68.4  Temp (24hrs), Avg:98.5 F (36.9 C), Min:98.4 F (36.9 C), Max:98.7 F (37.1 C)  Recent Labs  Lab 09/08/22 1650 09/09/22 0155 09/09/22 1243 09/10/22 0058 09/11/22 0221  WBC 7.6 14.5* 12.9* 17.9* 16.1*  CREATININE 0.83 0.97  --  1.20 0.97     Estimated Creatinine Clearance: 88.5 mL/min (by C-G formula based on SCr of 0.97 mg/dL).    Allergies  Allergen Reactions   Sertraline Hcl Rash    Zoloft   Antimicrobials: CTX 5/26>>  Vanc 5/28>>  Microbiology  5/28 Bcx: ngtd  5/26 Ucx: 30k col/ml MRSA, 10k col/ml lactobacillus    Thank you for allowing pharmacy to be a part of this patient's care.  Alphia Moh, PharmD, BCPS, BCCP Clinical Pharmacist  Please check AMION for all [facility] Pharmacy phone numbers After 10:00 PM, call Main Pharmacy [phone]

## 2022-09-12 DIAGNOSIS — R310 Gross hematuria: Secondary | ICD-10-CM | POA: Diagnosis not present

## 2022-09-12 LAB — BASIC METABOLIC PANEL
Anion gap: 6 (ref 5–15)
BUN: 8 mg/dL (ref 8–23)
CO2: 29 mmol/L (ref 22–32)
Calcium: 7.5 mg/dL — ABNORMAL LOW (ref 8.9–10.3)
Chloride: 105 mmol/L (ref 98–111)
Creatinine, Ser: 0.89 mg/dL (ref 0.61–1.24)
GFR, Estimated: >60 mL/min (ref >60)
Glucose, Bld: 100 mg/dL — ABNORMAL HIGH (ref 70–99)
Potassium: 3.1 mmol/L — ABNORMAL LOW (ref 3.5–5.1)
Sodium: 140 mmol/L (ref 135–145)

## 2022-09-12 LAB — CBC
HCT: 24.9 % — ABNORMAL LOW (ref 39.0–52.0)
Hemoglobin: 8 g/dL — ABNORMAL LOW (ref 13.0–17.0)
MCH: 30.2 pg (ref 26.0–34.0)
MCHC: 32.1 g/dL (ref 30.0–36.0)
MCV: 94 fL (ref 80.0–100.0)
Platelets: 227 10*3/uL (ref 150–400)
RBC: 2.65 MIL/uL — ABNORMAL LOW (ref 4.22–5.81)
RDW: 17.6 % — ABNORMAL HIGH (ref 11.5–15.5)
WBC: 13.1 10*3/uL — ABNORMAL HIGH (ref 4.0–10.5)
nRBC: 0 % (ref 0.0–0.2)

## 2022-09-12 LAB — CREATININE, FLUID (PLEURAL, PERITONEAL, JP DRAINAGE): Creat, Fluid: 3.1 mg/dL

## 2022-09-12 MED ORDER — POTASSIUM CHLORIDE CRYS ER 20 MEQ PO TBCR
40.0000 meq | EXTENDED_RELEASE_TABLET | Freq: Once | ORAL | Status: AC
  Administered 2022-09-12: 40 meq via ORAL
  Filled 2022-09-12: qty 2

## 2022-09-12 MED ORDER — POLYETHYLENE GLYCOL 3350 17 G PO PACK: 17.0000 g | PACK | Freq: Every day | ORAL | Status: DC

## 2022-09-12 MED ORDER — SENNOSIDES-DOCUSATE SODIUM 8.6-50 MG PO TABS
1.0000 | ORAL_TABLET | Freq: Two times a day (BID) | ORAL | Status: DC
  Administered 2022-09-13: 1 via ORAL
  Filled 2022-09-12: qty 1

## 2022-09-12 NOTE — Progress Notes (Signed)
The patient complained of feeling like he needed to urinate while he still had the foley catheter in.  The nurse irrigated about 50 mL of sterile water into the catheter.  A clot came out and urine came out.  Patient states he feels better.  Will continue to monitor.  Harriet Masson, RN

## 2022-09-12 NOTE — Progress Notes (Signed)
PROGRESS NOTE  Noah Rosales  ZOX:[id number] DOB: January 02, 1961 DOA: 09/08/2022 PCP: Pcp, No   Brief Narrative: Patient is a 62 year old male with history of alcoholism, coronary artery disease, status post CABG who presented with flank pain, gross hematuria for 1 day.  He was trying to drink beer to pass kidney stones.  In the Emergency Department, he was found to be hemodynamically stable.  Noted to have gross hematuria with multiple clots in the urinary bladder.  Started on CBI and manual irrigation.  Status post  expiratory laparotomy for intraperitoneal bladder rupture,right nephrostomy tube placement by urology on 5/28.  Hospital course  remarkable for severe anemia secondary to hematuria requiring blood transfusion,now stable.  Urine culture showed MRSA.  Awaiting urology recommendation for further management.  Assessment & Plan:  Principal Problem:   Gross hematuria Active Problems:   Hyponatremia   Bladder mass   Alcohol abuse   Hematuria  Gross hematuria secondary to papillary bladder tumor/severe right-sided hydronephrosis/intraperitoneal bladder rupture: Presented with flank pain, gross hematuria.  Foley placed.  Urology consulted.  Status post expiratory laparotomy for intraperitoneal bladder rupture secondary to clot retention from bladder mass concerning for urothelial carcinoma.  Status post right nephrostomy tube placement by urology on 5/28.  Surgical pathology showed high-grade papillary urothelial carcinoma.  Kidney function normal.  Urology will discuss with patient about management plan about bladder cancer.  Considering at least 2 weeks of bladder rest prior to surgical intervention.  He has a lower abdominal drain.  Foley still draining pinkish/red urine  Normocytic anemia: Secondary to severe hematuria.  Total of 3 units of blood transfusion.  Hemoglobin of 8 today.  Continue monitoring.  MRSA UTI: Urine culture showing [id number] colonies of MRSA.  Currently on vancomycin.   Leukocytosis improving, no fever  Leukocytosis: Continue current antibiotics.  Monitor.  Blood culture has not shown any growth.  Patient is afebrile.  Improving  History of coronary artery disease: Status post CABG.Apparently not taking any meds..  No  anginal symptoms  Hyponatremia: Secondary to beer potomania.  Resolved  Hypokalemia: Supplement potassium  Chronic alcoholism: On CIWA protocol.  On folic acid and thiamine.  Patient was asking if beer can be served in the hospital.         DVT prophylaxis:SCDs Start: 09/08/22 2306     Code Status: Full Code  Family Communication: None at bedside  Patient status:Inpatient  Patient is from :home  Anticipated discharge YN:WGNF  Estimated DC date:as per urology recommendation   Consultants: Urology  Procedures:cystoscopy  Antimicrobials:  Anti-infectives (From admission, onward)    Start     Dose/Rate Route Frequency Ordered Stop   09/12/22 0600  vancomycin (VANCOREADY) IVPB 750 mg/150 mL        750 mg 150 mL/hr over 60 Minutes Intravenous Every 12 hours 09/11/22 1445     09/11/22 1000  cefTRIAXone (ROCEPHIN) 1 g in sodium chloride 0.9 % 100 mL IVPB  Status:  Discontinued        1 g 200 mL/hr over 30 Minutes Intravenous Every 24 hours 09/10/22 0908 09/10/22 0944   09/10/22 1100  vancomycin (VANCOREADY) IVPB 1250 mg/250 mL  Status:  Discontinued        1,250 mg 166.7 mL/hr over 90 Minutes Intravenous Every 24 hours 09/10/22 1001 09/11/22 1445   09/10/22 1000  cefTRIAXone (ROCEPHIN) 2 g in sodium chloride 0.9 % 100 mL IVPB  Status:  Discontinued       Note to Pharmacy: Will be given in  radiology, do not send to floor   2 g 200 mL/hr over 30 Minutes Intravenous  Once 09/10/22 0905 09/10/22 0957   09/09/22 2200  cefTRIAXone (ROCEPHIN) 1 g in sodium chloride 0.9 % 100 mL IVPB  Status:  Discontinued        1 g 200 mL/hr over 30 Minutes Intravenous Every 24 hours 09/08/22 2255 09/10/22 0908   09/09/22 1000  cefTRIAXone  (ROCEPHIN) 1 g in dextrose 5 % 50 mL IVPB  Status:  Discontinued        1 g 120 mL/hr over 30 Minutes Intravenous  Once 09/09/22 0910 09/09/22 0912   09/08/22 1830  cefTRIAXone (ROCEPHIN) 1 g in sodium chloride 0.9 % 100 mL IVPB        1 g 200 mL/hr over 30 Minutes Intravenous  Once 09/08/22 1823 09/08/22 1940       Subjective: Patient seen and examined at bedside today.  Denies any significant abdominal pain.  Foley still draining red urine.  Remains altered oriented.  Objective: Vitals:   09/11/22 2325 09/12/22 0323 09/12/22 0834 09/12/22 1123  BP: (!) 146/70 (!) 144/83 (!) 148/83 (!) 154/85  Pulse: 86 85 88 80  Resp: 16 16 18 17   Temp: 98.4 F (36.9 C) 98.2 F (36.8 C) 98.4 F (36.9 C) 98.3 F (36.8 C)  TempSrc: Oral Oral Oral Oral  SpO2: 92% 93% 94% 97%  Weight:      Height:        Intake/Output Summary (Last 24 hours) at 09/12/2022 1144 Last data filed at 09/12/2022 0834 Gross per 24 hour  Intake 1045 ml  Output 3430 ml  Net -2385 ml   Filed Weights   09/08/22 1601  Weight: 95.3 kg    Examination:  General exam: Overall comfortable, not in distress,obese HEENT: PERRL Respiratory system:  no wheezes or crackles  Cardiovascular system: S1 & S2 heard, RRR.  Gastrointestinal system: Abdomen is nondistended, soft.  Lower abdominal surgical wound with sutures, lower abdominal drain Central nervous system: Alert and oriented Extremities: No edema, no clubbing ,no cyanosis Skin: No rashes, no ulcers,no icterus   GU: Foley with sanguinous urine   Data Reviewed: I have personally reviewed following labs and imaging studies  CBC: Recent Labs  Lab 09/08/22 1650 09/09/22 0155 09/09/22 1243 09/09/22 2149 09/10/22 0058 09/10/22 0729 09/10/22 1527 09/11/22 0221 09/12/22 0215  WBC 7.6 14.5* 12.9*  --  17.9*  --   --  16.1* 13.1*  NEUTROABS 6.1  --   --   --   --   --   --  13.8*  --   HGB 9.0* 8.5* 7.4*   < > 6.8* 7.1* 8.3* 8.0* 8.0*  HCT 28.6* 26.5* 23.4*    < > 21.0* 21.6* 25.7* 24.4* 24.9*  MCV 98.3 95.7 97.9  --  96.8  --   --  93.5 94.0  PLT 112* 246 181  --  169  --   --  190 227   < > = values in this interval not displayed.   Basic Metabolic Panel: Recent Labs  Lab 09/08/22 1650 09/09/22 0155 09/10/22 0058 09/11/22 0221 09/12/22 0215  NA 126* 129* 132* 139 140  K 4.1 4.1 4.5 3.5 3.1*  CL 94* 95* 103 106 105  CO2 20* 20* 23 26 29   GLUCOSE 103* 128* 151* 94 100*  BUN 7* 8 11 9 8   CREATININE 0.83 0.97 1.20 0.97 0.89  CALCIUM 7.7* 7.9* 6.7* 7.3* 7.5*  MG  --   --  1.7 2.1  --   PHOS  --   --  2.9  --   --      Recent Results (from the past 240 hour(s))  Urine Culture     Status: Abnormal   Collection Time: 09/08/22  5:08 PM   Specimen: Urine, Catheterized  Result Value Ref Range Status   Specimen Description URINE, CATHETERIZED  Final   Special Requests NONE  Final   Culture (A)  Final    30,000 COLONIES/mL METHICILLIN RESISTANT STAPHYLOCOCCUS AUREUS 10,000 COLONIES/mL LACTOBACILLUS SPECIES Standardized susceptibility testing for this organism is not available. Performed at [facility], [address] [place], [place] [address]    Report Status 09/10/2022 FINAL  Final   Organism ID, Bacteria METHICILLIN RESISTANT STAPHYLOCOCCUS AUREUS (A)  Final      Susceptibility   Methicillin resistant staphylococcus aureus - MIC*    CIPROFLOXACIN >=8 RESISTANT Resistant     GENTAMICIN <=0.5 SENSITIVE Sensitive     NITROFURANTOIN <=16 SENSITIVE Sensitive     OXACILLIN >=4 RESISTANT Resistant     TETRACYCLINE <=1 SENSITIVE Sensitive     VANCOMYCIN <=0.5 SENSITIVE Sensitive     TRIMETH/SULFA <=10 SENSITIVE Sensitive     CLINDAMYCIN <=0.25 SENSITIVE Sensitive     RIFAMPIN <=0.5 SENSITIVE Sensitive     Inducible Clindamycin NEGATIVE Sensitive     LINEZOLID 2 SENSITIVE Sensitive     * 30,000 COLONIES/mL METHICILLIN RESISTANT STAPHYLOCOCCUS AUREUS  Culture, blood (Routine X 2) w Reflex to ID Panel     Status: None  (Preliminary result)   Collection Time: 09/10/22 10:28 AM   Specimen: BLOOD RIGHT ARM  Result Value Ref Range Status   Specimen Description BLOOD RIGHT ARM  Final   Special Requests   Final    BOTTLES DRAWN AEROBIC AND ANAEROBIC Blood Culture adequate volume   Culture   Final    NO GROWTH 2 DAYS Performed at [facility], [address] [place], [place] [address]    Report Status PENDING  Incomplete  Culture, blood (Routine X 2) w Reflex to ID Panel     Status: None (Preliminary result)   Collection Time: 09/10/22 10:28 AM   Specimen: BLOOD  Result Value Ref Range Status   Specimen Description BLOOD SITE NOT SPECIFIED  Final   Special Requests   Final    BOTTLES DRAWN AEROBIC AND ANAEROBIC Blood Culture adequate volume   Culture   Final    NO GROWTH 2 DAYS Performed at [facility], [address], [place], [place] [address]    Report Status PENDING  Incomplete     Radiology Studies: No results found.  Scheduled Meds:  sodium chloride   Intravenous Once   Chlorhexidine Gluconate Cloth  6 each Topical Daily   folic acid  1 mg Oral Daily   multivitamin with minerals  1 tablet Oral Daily   polyethylene glycol  17 g Oral Daily   senna  1 tablet Oral BID   sodium chloride flush  5 mL Intracatheter Q8H   thiamine  100 mg Oral Daily   Or   thiamine  100 mg Intravenous Daily   Continuous Infusions:  vancomycin 750 mg (09/12/22 0545)     LOS: 3 days   Burnadette Pop, MD [facility] P5/30/2024, 11:44 AM

## 2022-09-12 NOTE — Progress Notes (Signed)
Referring Physician(s): Joesph July Winter, C  Supervising Physician: Dr. Lowella Dandy  Patient Status:  [facility] - In-pt  Chief Complaint:  Ureteral obstruction Rt PCN placed in IR 5/28  Subjective:  Pt sitting up in chair Feels better, denies much pain from PCN  Allergies: Sertraline hcl  Medications:  Current Facility-Administered Medications:    0.9 %  sodium chloride infusion (Manually program via Guardrails IV Fluids), , Intravenous, Once, Dorcas Carrow, MD   acetaminophen (TYLENOL) tablet 650 mg, 650 mg, Oral, Q6H PRN, 650 mg at 09/09/22 2136 **OR** acetaminophen (TYLENOL) suppository 650 mg, 650 mg, Rectal, Q6H PRN, Winter, [name] Aaron, MD   Chlorhexidine Gluconate Cloth 2 % PADS 6 each, 6 each, Topical, Daily, Dorcas Carrow, MD, 6 each at 09/12/22 1046   folic acid (FOLVITE) tablet 1 mg, 1 mg, Oral, Daily, Winter, [name] Aaron, MD, 1 mg at 09/12/22 1033   morphine (PF) 2 MG/ML injection 2-4 mg, 2-4 mg, Intravenous, Q4H PRN, Winter, [name] Aaron, MD, 2 mg at 09/12/22 0126   multivitamin with minerals tablet 1 tablet, 1 tablet, Oral, Daily, Winter, [name] Aaron, MD, 1 tablet at 09/12/22 1033   ondansetron (ZOFRAN) tablet 4 mg, 4 mg, Oral, Q6H PRN **OR** ondansetron (ZOFRAN) injection 4 mg, 4 mg, Intravenous, Q6H PRN, Winter, [name] Aaron, MD   polyethylene glycol (MIRALAX / GLYCOLAX) packet 17 g, 17 g, Oral, Daily, Adhikari, Amrit, MD   senna-docusate (Senokot-S) tablet 1 tablet, 1 tablet, Oral, BID, Adhikari, Amrit, MD   sodium chloride flush (NS) 0.9 % injection 5 mL, 5 mL, Intracatheter, Q8H, Mugweru, Jon, MD, 5 mL at 09/12/22 0545   thiamine (VITAMIN B1) tablet 100 mg, 100 mg, Oral, Daily, 100 mg at 09/11/22 0847 **OR** thiamine (VITAMIN B1) injection 100 mg, 100 mg, Intravenous, Daily, Winter, [name] Aaron, MD, 100 mg at 09/12/22 1034   vancomycin (VANCOREADY) IVPB 750 mg/150 mL, 750 mg, Intravenous, Q12H, Leander Rams, [facility], Last Rate: 150  mL/hr at 09/12/22 0545, 750 mg at 09/12/22 0545    Vital Signs: BP (!) 154/85 (BP Location: Right Arm)   Pulse 80   Temp 98.3 F (36.8 C) (Oral)   Resp 17   Ht 5\' 8"  (1.727 m)   Wt 210 lb (95.3 kg)   SpO2 97%   BMI 31.93 kg/m   Physical Exam Vitals reviewed.  HENT:     Mouth/Throat:     Mouth: Mucous membranes are moist.     Pharynx: Oropharynx is clear.  Skin:    General: Skin is warm.     Comments: (R)PCN site clean and dry Dressing changed  No sign of infection  Thin yellow urine OP -- still some blood tinged   Neurological:     Mental Status: He is alert and oriented to person, place, and time.  Psychiatric:        Behavior: Behavior normal.     Imaging: IR NEPHROSTOMY PLACEMENT RIGHT  Result Date: 09/10/2022 INDICATION: Hydronephrosis.  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 EXAM: ULTRASOUND AND FLUOROSCOPIC GUIDED PLACEMENT OF RIGHT NEPHROSTOMY TUBE COMPARISON:  US Renal, 09/09/2022.  CT AP, 09/08/2022. MEDICATIONS: Rocephin 2 gm IV; The antibiotic was administered in an appropriate time frame prior to skin puncture. ANESTHESIA/SEDATION: Moderate (conscious) sedation was employed during this procedure. A total of Versed 1 mg and Fentanyl 50 mcg was administered intravenously. Moderate Sedation Time: 13 minutes. The patient's level of consciousness and vital signs were monitored continuously by radiology nursing throughout the procedure under my direct supervision. CONTRAST:  10 mL Isovue 300 - administered into the renal collecting system FLUOROSCOPY TIME:  Fluoroscopic dose; 2 mGy COMPLICATIONS: None immediate. PROCEDURE: The procedure, risks, benefits, and alternatives were explained to the patient and/or patient's representative, questions were encouraged and answered and informed consent was obtained. A timeout was performed prior to the initiation of the procedure. The operative site was prepped and draped in the usual sterile fashion and a sterile drape was  applied covering the operative field. A sterile gown and sterile gloves were used for the procedure. Local anesthesia was provided with 1% Lidocaine with epinephrine. Ultrasound was used to localize the RIGHT kidney. Under direct ultrasound guidance, a 20 gauge needle was advanced into the renal collecting system. An ultrasound image documentation was performed. Access within the collecting system was confirmed with the efflux of urine followed by limited contrast injection. Under intermittent fluoroscopic guidance, an 0.018 wire was advanced into the collecting system and the tract was dilated with an Accustick stent. Next, over a short Amplatz wire, the track was further dilated ultimately allowing placement of a 10 Fr nephrostomy catheter with end coiled and locked within the renal pelvis. Contrast was injected and several spot fluoroscopic images were obtained in various obliquities. The catheter was secured at the skin entrance site with an interrupted suture and a stat lock device and connected to a gravity bag. Dressings were applied. The patient tolerated procedure well without immediate postprocedural complication. FINDINGS: Ultrasound scanning demonstrates a moderate-to-severe dilated RIGHT collecting system. Under a combination of ultrasound and fluoroscopic guidance, a posterior inferior calix was targeted allowing placement of a 10 Fr percutaneous nephrostomy catheter with end coiled and locked within the renal pelvis. Contrast injection confirmed appropriate positioning. IMPRESSION: Successful placement of a RIGHT sided 10 Fr percutaneous nephrostomy drainage catheter. RECOMMENDATIONS: The patient will return to Vascular Interventional Radiology (VIR) for routine drainage catheter evaluation and exchange in 6-8 weeks. Roanna Banning, MD [facility] Radiology Electronically Signed   By: Roanna Banning M.D.   On: 09/10/2022 10:22   US RENAL  Result Date:  09/09/2022 CLINICAL DATA:  Bladder rupture secondary to bladder tumor and right hydronephrosis. EXAM: RENAL / URINARY TRACT ULTRASOUND COMPLETE COMPARISON:  CT of the abdomen and pelvis on 09/08/2022 FINDINGS: Right Kidney: Renal measurements: 11.8 x 7.4 x 5.1 cm = volume: 232 mL. Persistent severe right hydronephrosis. No solid masses identified. Left Kidney: Renal measurements: 10.6 x 5.7 x 7.0 cm = volume: 220 mL. Echogenicity within normal limits. No mass or hydronephrosis visualized. Bladder: The bladder was not imaged after recent exploratory laparotomy with bladder repair. Other: None. IMPRESSION: 1. Persistent severe right hydronephrosis. 2. Normal appearance of the left kidney. Electronically Signed   By: Irish Lack M.D.   On: 09/09/2022 13:11   DG C-Arm 1-60 Min  Result Date: 09/09/2022 CLINICAL DATA:  Surgery, cystoscopy. EXAM: DG C-ARM 1-60 MIN CONTRAST:  Not provided. FLUOROSCOPY: Fluoroscopy Time:  18.3 seconds Radiation Exposure Index (if provided by the fluoroscopic device): 8.89 mGy Number of Acquired Spot Images: 2 COMPARISON:  CT yesterday FINDINGS: Two fluoroscopic spot views demonstrate contrast filling the presumed urinary bladder, extending laterally in the pelvis. Possible filling defect eccentrically in the bladder, recommend correlation with procedure report. IMPRESSION: Fluoroscopic spot views. Electronically Signed   By: Narda Rutherford M.D.   On: 09/09/2022 12:27   CT Renal Stone Study  Result Date: 09/08/2022 CLINICAL DATA:  Abdominal pain. EXAM: CT ABDOMEN AND PELVIS WITHOUT CONTRAST TECHNIQUE: Multidetector CT imaging  of the abdomen and pelvis was performed following the standard protocol without IV contrast. RADIATION DOSE REDUCTION: This exam was performed according to the departmental dose-optimization program which includes automated exposure control, adjustment of the mA and/or kV according to patient size and/or use of iterative reconstruction technique.  COMPARISON:  November 07, 2021 FINDINGS: Lower chest: Small left pleural effusion. Normal heart size calcific atherosclerotic disease of the coronary arteries and aorta. Hepatobiliary: No focal liver abnormality is seen. Status post cholecystectomy. No biliary dilatation. Pancreas: Unremarkable. No pancreatic ductal dilatation or surrounding inflammatory changes. Spleen: Normal in size without focal abnormality. Adrenals/Urinary Tract: Normal adrenal glands. Normal left kidney and left ureter. 1.8 cm indeterminate right renal mass, exophytic off of the posterior pole of the right kidney. Marked right hydronephrosis and hydroureter to the level of the vesicoureteral junction. The urinary bladder is markedly distended and contains what appears to be multiple blood clots. There are also gas bubbles within the urinary bladder. Urinary Foley has been placed. Stomach/Bowel: Stomach is within normal limits. No evidence of appendicitis. No evidence of bowel wall thickening, distention, or inflammatory changes. Vascular/Lymphatic: Aortic atherosclerosis. No enlarged abdominal or pelvic lymph nodes. Reproductive: Mildly enlarged prostate gland. Other: No abdominal wall hernia or abnormality. No abdominopelvic ascites. Musculoskeletal: No acute or significant osseous findings. IMPRESSION: 1. Marked right hydronephrosis and hydroureter to the level of the vesicoureteral junction. The urinary bladder is markedly distended, to 21.9 cm, and contains what appears to be multiple blood clots. There are also gas bubbles within the urinary bladder. Urinary Foley has been placed. 2. 1.8 cm indeterminate right renal mass, exophytic off of the posterior pole of the right kidney. Further evaluation with renal protocol CT or MRI may be considered, when clinically appropriate. 3. Small left pleural effusion. 4. Aortic atherosclerosis. Aortic Atherosclerosis (ICD10-I70.0). Electronically Signed   By: Ted Mcalpine M.D.   On: 09/08/2022  17:53    Labs:  CBC: Recent Labs    09/09/22 1243 09/09/22 2149 09/10/22 0058 09/10/22 0729 09/10/22 1527 09/11/22 0221 09/12/22 0215  WBC 12.9*  --  17.9*  --   --  16.1* 13.1*  HGB 7.4*   < > 6.8* 7.1* 8.3* 8.0* 8.0*  HCT 23.4*   < > 21.0* 21.6* 25.7* 24.4* 24.9*  PLT 181  --  169  --   --  190 227   < > = values in this interval not displayed.     COAGS: Recent Labs    09/10/22 0058  INR 1.3*     BMP: Recent Labs    09/09/22 0155 09/10/22 0058 09/11/22 0221 09/12/22 0215  NA 129* 132* 139 140  K 4.1 4.5 3.5 3.1*  CL 95* 103 106 105  CO2 20* 23 26 29   GLUCOSE 128* 151* 94 100*  BUN 8 11 9 8   CALCIUM 7.9* 6.7* 7.3* 7.5*  CREATININE 0.97 1.20 0.97 0.89  GFRNONAA >60 >60 >60 >60     LIVER FUNCTION TESTS: Recent Labs    11/07/21 1732 11/08/21 0439 09/08/22 1650 09/10/22 0058  BILITOT 1.2 1.6* 1.2 2.4*  AST 48* 40 46* 38  ALT 40 37 33 24  ALKPHOS 87 77 83 45  PROT 6.9 6.6 6.0* 4.8*  ALBUMIN 3.6 3.5 3.1* 2.4*    Drain Location: CVA right Size: Fr size: 10 Fr Date of placement: 09/10/22  Currently to: Drain collection device: gravity 24 hour output:  Output by Drain (mL) 09/10/22 0701 - 09/10/22 1900 09/10/22 1901 - 09/11/22 0700  09/11/22 0701 - 09/11/22 1900 09/11/22 1901 - 09/12/22 0700 09/12/22 0701 - 09/12/22 1404  Closed System Drain 1 Right;Anterior;Distal RLQ Bulb (JP) 22 Fr. 40 165 75 30 20    Interval imaging/drain manipulation:  None  Current examination:  Insertion site unremarkable. Suture and stat lock in place. Dressed appropriately.   Plan: Flush PCN daily - 10 cc sterile saline Record output Q shift. Dressing changes QD or PRN if soiled.  Call IR APP or on call IR MD if difficulty flushing or sudden change in drain output.  .  IR will continue to follow - please call with questions or concerns.  Assessment and Plan:  Follow with Urology Urologist-Notify IR if any needs--- exchange as OP   Electronically  Signed: Brayton El, PA-C 09/12/2022, 2:04 PM   I spent a total of 15 Minutes at the the patient's bedside AND on the patient's hospital floor or unit, greater than 50% of which was counseling/coordinating care for R PCN

## 2022-09-12 NOTE — Progress Notes (Signed)
The patient complained of experiencing cramps in his abdominal area.  The nurse irrigated the foley catheter several times. As a result, several medium clots came out of the foley.  The patient states he feels better.  Will continue to monitor.  Harriet Masson, RN

## 2022-09-12 NOTE — Progress Notes (Signed)
3 Days Post-Op Subjective: NAEON. Pt transfused yesterday with ongoing hematuria. Requesting food.   Objective: Vital signs in last 24 hours: Temp:  [98.2 F (36.8 C)-99 F (37.2 C)] 98.4 F (36.9 C) (05/30 0834) Pulse Rate:  [85-99] 88 (05/30 0834) Resp:  [16-18] 18 (05/30 0834) BP: (138-148)/(67-83) 148/83 (05/30 0834) SpO2:  [91 %-94 %] 94 % (05/30 0834)  Intake/Output from previous day: 05/29 0701 - 05/30 0700 In: 1285 [P.O.:1080] Out: 3855 [Urine:3750; Drains:105]  Intake/Output this shift: Total I/O In: -  Out: 600 [Urine:600]  Physical Exam:  General: Alert and oriented CV: No cyanosis Lungs: equal chest rise Abdomen: Soft, NTND, no rebound or guarding Skin: Gu:  3 way hematuria cath in place. Medium red urine  Lab Results: Recent Labs    09/10/22 1527 09/11/22 0221 09/12/22 0215  HGB 8.3* 8.0* 8.0*  HCT 25.7* 24.4* 24.9*   BMET Recent Labs    09/11/22 0221 09/12/22 0215  NA 139 140  K 3.5 3.1*  CL 106 105  CO2 26 29  GLUCOSE 94 100*  BUN 9 8  CREATININE 0.97 0.89  CALCIUM 7.3* 7.5*     Studies/Results: No results found.  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 Moderate hematuria.  Would prefer at least 2 weeks bladder rest prior to surgical intervention. Risk v. Benefit may change with recurrent hemorrhage.  Nursing to hand irrigate Q3 when awake and PRN.  HgB stable at 8.0. Continue daily H&H  #Bladder Mass #Hydronephrosis  Right percutaneous nephrostomy tube placed 5/28 d/t ureteral obstruction. Clear yellow urine mildly blood tinged, scant mucus stranding.  Path report high grade UC with muscle invasion. May ultimately require cystectomy. Will discuss treatment options outpatient    LOS: 3 days   Elmon Kirschner, NP [facility] Pager: [phone]  09/12/2022, 9:43 AM

## 2022-09-12 NOTE — Progress Notes (Signed)
Gently irrigated foley with approximately 400 ml of sterile water ensuring return.  Multiple blood clots were removed.  Patient stated he felt much better after.

## 2022-09-12 NOTE — TOC Progression Note (Signed)
Transition of Care (TOC) - Progression Note   Patient continues with intermitten bladder irrigation. Anticipate H RN would be helpful, especially in case he goes home with foley or nephrostomy tube.  Patient is in agreement, and has no prefrence for [facility] provider, Referral accepted by [facility].  HH RN order placed.  Patient Details  Name: Noah Rosales MRN: [id number] Date of Birth: 11-Jan-1961  Transition of Care [facility]) CM/SW Contact  Lawerance Sabal, RN Phone Number: 09/12/2022, 9:59 AM  Clinical Narrative:       Expected Discharge Plan: Home/Self Care Barriers to Discharge: Continued Medical Work up  Expected Discharge Plan and Services   Discharge Planning Services: CM Consult Post Acute Care Choice: Home Health Living arrangements for the past 2 months: Single Family Home                           HH Arranged: RN [facility] Agency: [facility] Date [facility] Agency Contacted: 09/12/22 Time HH Agency Contacted: [phone] Representative spoke with at [facility] Agency: Kandee Keen   Social Determinants of Health (SDOH) Interventions SDOH Screenings   Tobacco Use: High Risk (09/10/2022)    Readmission Risk Interventions     No data to display

## 2022-09-13 ENCOUNTER — Inpatient Hospital Stay (HOSPITAL_COMMUNITY): Payer: Medicare HMO

## 2022-09-13 ENCOUNTER — Other Ambulatory Visit (HOSPITAL_COMMUNITY): Payer: Self-pay

## 2022-09-13 ENCOUNTER — Other Ambulatory Visit: Payer: Self-pay | Admitting: Physician Assistant

## 2022-09-13 DIAGNOSIS — N2889 Other specified disorders of kidney and ureter: Secondary | ICD-10-CM

## 2022-09-13 DIAGNOSIS — R310 Gross hematuria: Secondary | ICD-10-CM | POA: Diagnosis not present

## 2022-09-13 DIAGNOSIS — R338 Other retention of urine: Secondary | ICD-10-CM

## 2022-09-13 LAB — BASIC METABOLIC PANEL
Anion gap: 7 (ref 5–15)
BUN: 7 mg/dL — ABNORMAL LOW (ref 8–23)
CO2: 29 mmol/L (ref 22–32)
Calcium: 7.6 mg/dL — ABNORMAL LOW (ref 8.9–10.3)
Chloride: 102 mmol/L (ref 98–111)
Creatinine, Ser: 0.75 mg/dL (ref 0.61–1.24)
GFR, Estimated: >60 mL/min (ref >60)
Glucose, Bld: 101 mg/dL — ABNORMAL HIGH (ref 70–99)
Potassium: 3 mmol/L — ABNORMAL LOW (ref 3.5–5.1)
Sodium: 138 mmol/L (ref 135–145)

## 2022-09-13 LAB — CBC
HCT: 26 % — ABNORMAL LOW (ref 39.0–52.0)
Hemoglobin: 8.3 g/dL — ABNORMAL LOW (ref 13.0–17.0)
MCH: 30 pg (ref 26.0–34.0)
MCHC: 31.9 g/dL (ref 30.0–36.0)
MCV: 93.9 fL (ref 80.0–100.0)
Platelets: 258 10*3/uL (ref 150–400)
RBC: 2.77 MIL/uL — ABNORMAL LOW (ref 4.22–5.81)
RDW: 16.7 % — ABNORMAL HIGH (ref 11.5–15.5)
WBC: 10 10*3/uL (ref 4.0–10.5)
nRBC: 0.2 % (ref 0.0–0.2)

## 2022-09-13 LAB — CULTURE, BLOOD (ROUTINE X 2): Special Requests: ADEQUATE

## 2022-09-13 MED ORDER — VITAMIN B-1 100 MG PO TABS: 100.0000 mg | ORAL_TABLET | Freq: Every day | ORAL | 1 refills | Status: DC

## 2022-09-13 MED ORDER — DOXYCYCLINE HYCLATE 100 MG PO TABS
100.0000 mg | ORAL_TABLET | Freq: Two times a day (BID) | ORAL | 0 refills | Status: AC
  Filled 2022-09-13: qty 6, 3d supply, fill #0

## 2022-09-13 MED ORDER — THIAMINE HCL 100 MG PO TABS
100.0000 mg | ORAL_TABLET | Freq: Every day | ORAL | 1 refills | Status: DC
  Filled 2022-09-13: qty 30, 30d supply, fill #0

## 2022-09-13 MED ORDER — POTASSIUM CHLORIDE CRYS ER 20 MEQ PO TBCR
40.0000 meq | EXTENDED_RELEASE_TABLET | Freq: Every day | ORAL | 0 refills | Status: DC
  Filled 2022-09-13: qty 10, 5d supply, fill #0

## 2022-09-13 MED ORDER — POLYETHYLENE GLYCOL 3350 17 G PO PACK: 17.0000 g | PACK | Freq: Every day | ORAL | 0 refills | Status: DC

## 2022-09-13 MED ORDER — DOXYCYCLINE HYCLATE 100 MG PO TBEC: 100.0000 mg | DELAYED_RELEASE_TABLET | Freq: Two times a day (BID) | ORAL | 0 refills | Status: DC

## 2022-09-13 MED ORDER — IOHEXOL 350 MG/ML SOLN
75.0000 mL | Freq: Once | INTRAVENOUS | Status: AC | PRN
  Administered 2022-09-13: 75 mL via INTRAVENOUS

## 2022-09-13 MED ORDER — FOLIC ACID 1 MG PO TABS: 1.0000 mg | ORAL_TABLET | Freq: Every day | ORAL | 1 refills | Status: DC

## 2022-09-13 MED ORDER — FOLIC ACID 1 MG PO TABS
1.0000 mg | ORAL_TABLET | Freq: Every day | ORAL | 1 refills | Status: DC
  Filled 2022-09-13: qty 30, 30d supply, fill #0

## 2022-09-13 MED ORDER — POLYETHYLENE GLYCOL 3350 17 GM/SCOOP PO POWD
17.0000 g | Freq: Every day | ORAL | 0 refills | Status: AC
  Filled 2022-09-13: qty 238, 14d supply, fill #0

## 2022-09-13 MED ORDER — POTASSIUM CHLORIDE CRYS ER 20 MEQ PO TBCR: 40.0000 meq | EXTENDED_RELEASE_TABLET | Freq: Every day | ORAL | 0 refills | Status: DC

## 2022-09-13 MED ORDER — POTASSIUM CHLORIDE CRYS ER 20 MEQ PO TBCR
40.0000 meq | EXTENDED_RELEASE_TABLET | ORAL | Status: AC
  Administered 2022-09-13 (×2): 40 meq via ORAL
  Filled 2022-09-13 (×2): qty 2

## 2022-09-13 NOTE — Care Management Important Message (Signed)
Important Message  Patient Details  Name: Noah Rosales MRN: [id number] Date of Birth: 21-Oct-1960   Medicare Important Message Given:  Yes     Zaccary, Kitz 09/13/2022, 11:08 AM

## 2022-09-13 NOTE — Progress Notes (Addendum)
4 Days Post-Op Subjective: NAEON. Pt transfused yesterday with ongoing hematuria. Requesting food.   Objective: Vital signs in last 24 hours: Temp:  [98.1 F (36.7 C)-98.6 F (37 C)] 98.1 F (36.7 C) (05/31 0849) Pulse Rate:  [80-97] 82 (05/31 0849) Resp:  [16-20] 20 (05/31 0849) BP: (123-154)/(63-85) 138/80 (05/31 0849) SpO2:  [95 %-97 %] 97 % (05/31 0849)  Intake/Output from previous day: 05/30 0701 - 05/31 0700 In: 2345.1 [P.O.:720; IV Piggyback:420.1] Out: 4730 [Urine:4700; Drains:30]  Intake/Output this shift: Total I/O In: 30.1 [IV Piggyback:30.1] Out: -   Physical Exam:  General: Alert and oriented CV: No cyanosis Lungs: equal chest rise Abdomen: Soft, NTND, no rebound or guarding Skin: Gu:  3 way hematuria cath in place. Medium red urine  Lab Results: Recent Labs    09/11/22 0221 09/12/22 0215 09/13/22 0051  HGB 8.0* 8.0* 8.3*  HCT 24.4* 24.9* 26.0*   BMET Recent Labs    09/12/22 0215 09/13/22 0051  NA 140 138  K 3.1* 3.0*  CL 105 102  CO2 29 29  GLUCOSE 100* 101*  BUN 8 7*  CREATININE 0.89 0.75  CALCIUM 7.5* 7.6*     Studies/Results: No results found.  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  Would prefer at least 2 weeks bladder rest prior to surgical intervention. Risk v. Benefit may change with recurrent hemorrhage.  Nursing to hand irrigate Q3 when awake and PRN.  HgB improved. 8.0-->8.3. Continue daily H&H  #Bladder Mass #Hydronephrosis  Right percutaneous nephrostomy tube placed 5/28 d/t ureteral obstruction. Clear yellow urine mildly blood tinged, scant mucus stranding.  Path report high grade UC with muscle invasion. May ultimately require cystectomy. Will discuss treatment options outpatient. Referral has been placed with one of our robotic surgeons who specializes in this procedure. Will  also request our schedulers to have him in APP clinic within about a week for repeat labs and reassessment.  Pending CT to assist in staging.  If remains stable overnight, OK to discharge from Urologic perspective.     LOS: 4 days   Elmon Kirschner, NP [facility] Pager: [phone]  09/13/2022, 10:02 AM    Pt seen and examined.  Did not require transfusion yesterday.  Tolerating a regular diet and having bowel movements.  Hematuria is still present, but improving--will likely be an ongoing issue until he has a cystectomy.  CT pending to complete his staging.  D/c JP drain.  Pt stable for d/c from a GU standpoint.

## 2022-09-13 NOTE — Discharge Summary (Addendum)
Physician Discharge Summary  Noah Rosales ZOX:[id number] DOB: 04/21/1960 DOA: 09/08/2022  PCP: Oneita Hurt, No  Admit date: 09/08/2022 Discharge date: 09/13/2022  Admitted From: Home Disposition:  Home  Discharge Condition:Stable CODE STATUS:FULL Diet recommendation: Heart Healthy  Brief/Interim Summary: Patient is a 62 year old male with history of alcoholism, coronary artery disease, status post CABG who presented with flank pain, gross hematuria for 1 day.  He was trying to drink beer to pass kidney stones.  In the Emergency Department, he was found to be hemodynamically stable.  Noted to have gross hematuria with multiple clots in the urinary bladder.  Started on CBI and manual irrigation.  Status post  expiratory laparotomy for intraperitoneal bladder rupture,right nephrostomy tube placement by urology on 5/28.  Hospital course  remarkable for severe anemia secondary to hematuria requiring blood transfusion,now stable.  Urine culture showed MRSA.  Urology cleared for discharge today and will follow as an outpatient.  Medically stable for discharge  Following problems were addressed during the hospitalization:  Gross hematuria secondary to papillary bladder tumor/severe right-sided hydronephrosis/intraperitoneal bladder rupture: Presented with flank pain, gross hematuria.  Foley placed.  Urology consulted.  Status post expiratory laparotomy for intraperitoneal bladder rupture secondary to clot retention from bladder mass concerning for urothelial carcinoma.  Status post right nephrostomy tube placement by urology on 5/28.  Surgical pathology showed high-grade papillary urothelial carcinoma.  Kidney function normal.  He may need cystectomy and urology will follow him as an outpatient.  Urology considering at least 2 weeks of bladder rest prior to surgical intervention.  He had a lower abdominal drain which has been to today. Foley still draining pinkish/red urine  Normocytic anemia: Secondary to  severe hematuria.  Total of 3 units of blood transfusion.  Check CBC in a week  MRSA UTI: Urine culture showing [id number] colonies of MRSA.  He was 3 vancomycin.  Leukocytosis improved, no fever.  Antibiotic changed to doxycycline   Leukocytosis: Continue current antibiotics.   Blood culture has not shown any growth.  Patient is afebrile   History of coronary artery disease: Status post CABG.Apparently not taking any meds..  No  anginal symptoms   Hyponatremia: Secondary to beer potomania.  Resolved   Hypokalemia: Supplemented potassium   Chronic alcoholism: On folic acid and thiamine.  Counseled for cessation   Deconditioning: Patient seen by PT and recommend home health on discharge  discharge Diagnoses:  Principal Problem:   Gross hematuria Active Problems:   Hyponatremia   Bladder mass   Alcohol abuse   Hematuria    Discharge Instructions  Discharge Instructions     Diet general   Complete by: As directed    Discharge instructions   Complete by: As directed    1)Please take prescribed medications as instructed 2)Quit alcohol 3)Follow up with the PCP in a week.  Do a CBC, BMP tests during  the follow-up 4)Follow up with urology and IR as an outpatient   Increase activity slowly   Complete by: As directed    No wound care   Complete by: As directed       Allergies as of 09/13/2022       Reactions   Sertraline Hcl Rash   Zoloft        Medication List     TAKE these medications    doxycycline 100 MG EC tablet Commonly known as: DORYX Take 1 tablet (100 mg total) by mouth 2 (two) times daily for 3 days. Start taking on: September 14, 2022  folic acid 1 MG tablet Commonly known as: FOLVITE Take 1 tablet (1 mg total) by mouth daily. Start taking on: September 14, 2022   polyethylene glycol 17 g packet Commonly known as: MIRALAX / GLYCOLAX Take 17 g by mouth daily. Start taking on: September 14, 2022   potassium chloride SA 20 MEQ tablet Commonly known as: KLOR-CON  M Take 2 tablets (40 mEq total) by mouth daily for 5 days. Start taking on: September 14, 2022   thiamine 100 MG tablet Commonly known as: Vitamin B-1 Take 1 tablet (100 mg total) by mouth daily. Start taking on: September 14, 2022        Follow-up Information     [facility] Follow up.   Specialty: Home Health Services Why: the home health agency will call you to set up a time to come out to your house in a few days Contact information: [address] [place] [address] [phone]         [facility] Follow up on 09/20/2022.   Why: Follow-up at 9:15 AM Contact information: [address] [place] [address] [phone]               Allergies  Allergen Reactions   Sertraline Hcl Rash    Zoloft    Consultations: Urology, IR   Procedures/Studies: IR NEPHROSTOMY PLACEMENT RIGHT  Result Date: 09/10/2022 INDICATION: Hydronephrosis.  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 EXAM: ULTRASOUND AND FLUOROSCOPIC GUIDED PLACEMENT OF RIGHT NEPHROSTOMY TUBE COMPARISON:  US Renal, 09/09/2022.  CT AP, 09/08/2022. MEDICATIONS: Rocephin 2 gm IV; The antibiotic was administered in an appropriate time frame prior to skin puncture. ANESTHESIA/SEDATION: Moderate (conscious) sedation was employed during this procedure. A total of Versed 1 mg and Fentanyl 50 mcg was administered intravenously. Moderate Sedation Time: 13 minutes. The patient's level of consciousness and vital signs were monitored continuously by radiology nursing throughout the procedure under my direct supervision. CONTRAST:  10 mL Isovue 300 - administered into the renal collecting system FLUOROSCOPY TIME:  Fluoroscopic dose; 2 mGy COMPLICATIONS: None immediate. PROCEDURE: The procedure, risks, benefits, and alternatives were explained to the patient and/or patient's representative, questions were encouraged and answered and informed consent was obtained. A timeout  was performed prior to the initiation of the procedure. The operative site was prepped and draped in the usual sterile fashion and a sterile drape was applied covering the operative field. A sterile gown and sterile gloves were used for the procedure. Local anesthesia was provided with 1% Lidocaine with epinephrine. Ultrasound was used to localize the RIGHT kidney. Under direct ultrasound guidance, a 20 gauge needle was advanced into the renal collecting system. An ultrasound image documentation was performed. Access within the collecting system was confirmed with the efflux of urine followed by limited contrast injection. Under intermittent fluoroscopic guidance, an 0.018 wire was advanced into the collecting system and the tract was dilated with an Accustick stent. Next, over a short Amplatz wire, the track was further dilated ultimately allowing placement of a 10 Fr nephrostomy catheter with end coiled and locked within the renal pelvis. Contrast was injected and several spot fluoroscopic images were obtained in various obliquities. The catheter was secured at the skin entrance site with an interrupted suture and a stat lock device and connected to a gravity bag. Dressings were applied. The patient tolerated procedure well without immediate postprocedural complication. FINDINGS: Ultrasound scanning demonstrates a moderate-to-severe dilated RIGHT collecting system. Under a combination  of ultrasound and fluoroscopic guidance, a posterior inferior calix was targeted allowing placement of a 10 Fr percutaneous nephrostomy catheter with end coiled and locked within the renal pelvis. Contrast injection confirmed appropriate positioning. IMPRESSION: Successful placement of a RIGHT sided 10 Fr percutaneous nephrostomy drainage catheter. RECOMMENDATIONS: The patient will return to Vascular Interventional Radiology (VIR) for routine drainage catheter evaluation and exchange in 6-8 weeks. Roanna Banning, MD [facility] Radiology Electronically Signed   By: Roanna Banning M.D.   On: 09/10/2022 10:22   US RENAL  Result Date: 09/09/2022 CLINICAL DATA:  Bladder rupture secondary to bladder tumor and right hydronephrosis. EXAM: RENAL / URINARY TRACT ULTRASOUND COMPLETE COMPARISON:  CT of the abdomen and pelvis on 09/08/2022 FINDINGS: Right Kidney: Renal measurements: 11.8 x 7.4 x 5.1 cm = volume: 232 mL. Persistent severe right hydronephrosis. No solid masses identified. Left Kidney: Renal measurements: 10.6 x 5.7 x 7.0 cm = volume: 220 mL. Echogenicity within normal limits. No mass or hydronephrosis visualized. Bladder: The bladder was not imaged after recent exploratory laparotomy with bladder repair. Other: None. IMPRESSION: 1. Persistent severe right hydronephrosis. 2. Normal appearance of the left kidney. Electronically Signed   By: Irish Lack M.D.   On: 09/09/2022 13:11   DG C-Arm 1-60 Min  Result Date: 09/09/2022 CLINICAL DATA:  Surgery, cystoscopy. EXAM: DG C-ARM 1-60 MIN CONTRAST:  Not provided. FLUOROSCOPY: Fluoroscopy Time:  18.3 seconds Radiation Exposure Index (if provided by the fluoroscopic device): 8.89 mGy Number of Acquired Spot Images: 2 COMPARISON:  CT yesterday FINDINGS: Two fluoroscopic spot views demonstrate contrast filling the presumed urinary bladder, extending laterally in the pelvis. Possible filling defect eccentrically in the bladder, recommend correlation with procedure report. IMPRESSION: Fluoroscopic spot views. Electronically Signed   By: Narda Rutherford M.D.   On: 09/09/2022 12:27   CT Renal Stone Study  Result Date: 09/08/2022 CLINICAL DATA:  Abdominal pain. EXAM: CT ABDOMEN AND PELVIS WITHOUT CONTRAST TECHNIQUE: Multidetector CT imaging of the abdomen and pelvis was performed following the standard protocol without IV contrast. RADIATION DOSE REDUCTION: This exam was performed according to the departmental dose-optimization program  which includes automated exposure control, adjustment of the mA and/or kV according to patient size and/or use of iterative reconstruction technique. COMPARISON:  November 07, 2021 FINDINGS: Lower chest: Small left pleural effusion. Normal heart size calcific atherosclerotic disease of the coronary arteries and aorta. Hepatobiliary: No focal liver abnormality is seen. Status post cholecystectomy. No biliary dilatation. Pancreas: Unremarkable. No pancreatic ductal dilatation or surrounding inflammatory changes. Spleen: Normal in size without focal abnormality. Adrenals/Urinary Tract: Normal adrenal glands. Normal left kidney and left ureter. 1.8 cm indeterminate right renal mass, exophytic off of the posterior pole of the right kidney. Marked right hydronephrosis and hydroureter to the level of the vesicoureteral junction. The urinary bladder is markedly distended and contains what appears to be multiple blood clots. There are also gas bubbles within the urinary bladder. Urinary Foley has been placed. Stomach/Bowel: Stomach is within normal limits. No evidence of appendicitis. No evidence of bowel wall thickening, distention, or inflammatory changes. Vascular/Lymphatic: Aortic atherosclerosis. No enlarged abdominal or pelvic lymph nodes. Reproductive: Mildly enlarged prostate gland. Other: No abdominal wall hernia or abnormality. No abdominopelvic ascites. Musculoskeletal: No acute or significant osseous findings. IMPRESSION: 1. Marked right hydronephrosis and hydroureter to the level of the vesicoureteral junction. The urinary bladder is markedly distended, to 21.9 cm, and contains what appears to be multiple blood clots. There are also gas  bubbles within the urinary bladder. Urinary Foley has been placed. 2. 1.8 cm indeterminate right renal mass, exophytic off of the posterior pole of the right kidney. Further evaluation with renal protocol CT or MRI may be considered, when clinically appropriate. 3. Small left pleural  effusion. 4. Aortic atherosclerosis. Aortic Atherosclerosis (ICD10-I70.0). Electronically Signed   By: Ted Mcalpine M.D.   On: 09/08/2022 17:53      Subjective: Patient seen and examined at bedside today.  Very eager to go home today.  Long discussion had at the bedside again about the importance of quitting alcohol and follow-up with urology as an outpatient.   Hemodynamically stable for discharge.  Discharge Exam: Vitals:   09/13/22 0403 09/13/22 0849  BP: 137/79 138/80  Pulse: 80 82  Resp: 16 20  Temp: 98.6 F (37 C) 98.1 F (36.7 C)  SpO2: 96% 97%   Vitals:   09/12/22 2008 09/12/22 2247 09/13/22 0403 09/13/22 0849  BP: 123/63 138/73 137/79 138/80  Pulse: 97 93 80 82  Resp: 19 17 16 20   Temp: 98.4 F (36.9 C) 98.5 F (36.9 C) 98.6 F (37 C) 98.1 F (36.7 C)  TempSrc: Oral Oral Oral Oral  SpO2: 96% 95% 96% 97%  Weight:      Height:        General: Pt is alert, awake, not in acute distress Cardiovascular: RRR, S1/S2 +, no rubs, no gallops Respiratory: CTA bilaterally, no wheezing, no rhonchi Abdominal: Soft, NT, ND, bowel sounds + Extremities: no edema, no cyanosis GU: Foley    The results of significant diagnostics from this hospitalization (including imaging, microbiology, ancillary and laboratory) are listed below for reference.     Microbiology: Recent Results (from the past 240 hour(s))  Urine Culture     Status: Abnormal   Collection Time: 09/08/22  5:08 PM   Specimen: Urine, Catheterized  Result Value Ref Range Status   Specimen Description URINE, CATHETERIZED  Final   Special Requests NONE  Final   Culture (A)  Final    30,000 COLONIES/mL METHICILLIN RESISTANT STAPHYLOCOCCUS AUREUS 10,000 COLONIES/mL LACTOBACILLUS SPECIES Standardized susceptibility testing for this organism is not available. Performed at [facility], [address], [place], [place] [address]    Report Status 09/10/2022 FINAL  Final   Organism ID, Bacteria  METHICILLIN RESISTANT STAPHYLOCOCCUS AUREUS (A)  Final      Susceptibility   Methicillin resistant staphylococcus aureus - MIC*    CIPROFLOXACIN >=8 RESISTANT Resistant     GENTAMICIN <=0.5 SENSITIVE Sensitive     NITROFURANTOIN <=16 SENSITIVE Sensitive     OXACILLIN >=4 RESISTANT Resistant     TETRACYCLINE <=1 SENSITIVE Sensitive     VANCOMYCIN <=0.5 SENSITIVE Sensitive     TRIMETH/SULFA <=10 SENSITIVE Sensitive     CLINDAMYCIN <=0.25 SENSITIVE Sensitive     RIFAMPIN <=0.5 SENSITIVE Sensitive     Inducible Clindamycin NEGATIVE Sensitive     LINEZOLID 2 SENSITIVE Sensitive     * 30,000 COLONIES/mL METHICILLIN RESISTANT STAPHYLOCOCCUS AUREUS  Culture, blood (Routine X 2) w Reflex to ID Panel     Status: None (Preliminary result)   Collection Time: 09/10/22 10:28 AM   Specimen: BLOOD RIGHT ARM  Result Value Ref Range Status   Specimen Description BLOOD RIGHT ARM  Final   Special Requests   Final    BOTTLES DRAWN AEROBIC AND ANAEROBIC Blood Culture adequate volume   Culture   Final    NO GROWTH 3 DAYS Performed at [facility]  Lab, [address], [place], [place] [address]    Report Status PENDING  Incomplete  Culture, blood (Routine X 2) w Reflex to ID Panel     Status: None (Preliminary result)   Collection Time: 09/10/22 10:28 AM   Specimen: BLOOD  Result Value Ref Range Status   Specimen Description BLOOD SITE NOT SPECIFIED  Final   Special Requests   Final    BOTTLES DRAWN AEROBIC AND ANAEROBIC Blood Culture adequate volume   Culture   Final    NO GROWTH 3 DAYS Performed at [facility], [address], [place], [place] [address]    Report Status PENDING  Incomplete     Labs: BNP (last 3 results) No results for input(s): "BNP" in the last 8760 hours. Basic Metabolic Panel: Recent Labs  Lab 09/09/22 0155 09/10/22 0058 09/11/22 0221 09/12/22 0215 09/13/22 0051  NA 129* 132* 139 140 138  K 4.1 4.5 3.5 3.1* 3.0*  CL 95* 103 106 105 102  CO2 20* 23 26 29  29   GLUCOSE 128* 151* 94 100* 101*  BUN 8 11 9 8  7*  CREATININE 0.97 1.20 0.97 0.89 0.75  CALCIUM 7.9* 6.7* 7.3* 7.5* 7.6*  MG  --  1.7 2.1  --   --   PHOS  --  2.9  --   --   --    Liver Function Tests: Recent Labs  Lab 09/08/22 1650 09/10/22 0058  AST 46* 38  ALT 33 24  ALKPHOS 83 45  BILITOT 1.2 2.4*  PROT 6.0* 4.8*  ALBUMIN 3.1* 2.4*   No results for input(s): "LIPASE", "AMYLASE" in the last 168 hours. No results for input(s): "AMMONIA" in the last 168 hours. CBC: Recent Labs  Lab 09/08/22 1650 09/09/22 0155 09/09/22 1243 09/09/22 2149 09/10/22 0058 09/10/22 0729 09/10/22 1527 09/11/22 0221 09/12/22 0215 09/13/22 0051  WBC 7.6   < > 12.9*  --  17.9*  --   --  16.1* 13.1* 10.0  NEUTROABS 6.1  --   --   --   --   --   --  13.8*  --   --   HGB 9.0*   < > 7.4*   < > 6.8* 7.1* 8.3* 8.0* 8.0* 8.3*  HCT 28.6*   < > 23.4*   < > 21.0* 21.6* 25.7* 24.4* 24.9* 26.0*  MCV 98.3   < > 97.9  --  96.8  --   --  93.5 94.0 93.9  PLT 112*   < > 181  --  169  --   --  190 227 258   < > = values in this interval not displayed.   Cardiac Enzymes: No results for input(s): "CKTOTAL", "CKMB", "CKMBINDEX", "TROPONINI" in the last 168 hours. BNP: Invalid input(s): "POCBNP" CBG: Recent Labs  Lab 09/09/22 0417  GLUCAP 164*   D-Dimer No results for input(s): "DDIMER" in the last 72 hours. Hgb A1c No results for input(s): "HGBA1C" in the last 72 hours. Lipid Profile No results for input(s): "CHOL", "HDL", "LDLCALC", "TRIG", "CHOLHDL", "LDLDIRECT" in the last 72 hours. Thyroid function studies No results for input(s): "TSH", "T4TOTAL", "T3FREE", "THYROIDAB" in the last 72 hours.  Invalid input(s): "FREET3" Anemia work up No results for input(s): "VITAMINB12", "FOLATE", "FERRITIN", "TIBC", "IRON", "RETICCTPCT" in the last 72 hours. Urinalysis    Component Value Date/Time   COLORURINE YELLOW 09/10/2022 0924   APPEARANCEUR HAZY (A) 09/10/2022 0924   LABSPEC 1.026 09/10/2022  0924   PHURINE  5.0 09/10/2022 0924   GLUCOSEU NEGATIVE 09/10/2022 0924   HGBUR LARGE (A) 09/10/2022 0924   BILIRUBINUR NEGATIVE 09/10/2022 0924   KETONESUR NEGATIVE 09/10/2022 0924   PROTEINUR NEGATIVE 09/10/2022 0924   UROBILINOGEN 1.0 11/20/2010 1346   NITRITE NEGATIVE 09/10/2022 0924   LEUKOCYTESUR NEGATIVE 09/10/2022 0924   Sepsis Labs Recent Labs  Lab 09/10/22 0058 09/11/22 0221 09/12/22 0215 09/13/22 0051  WBC 17.9* 16.1* 13.1* 10.0   Microbiology Recent Results (from the past 240 hour(s))  Urine Culture     Status: Abnormal   Collection Time: 09/08/22  5:08 PM   Specimen: Urine, Catheterized  Result Value Ref Range Status   Specimen Description URINE, CATHETERIZED  Final   Special Requests NONE  Final   Culture (A)  Final    30,000 COLONIES/mL METHICILLIN RESISTANT STAPHYLOCOCCUS AUREUS 10,000 COLONIES/mL LACTOBACILLUS SPECIES Standardized susceptibility testing for this organism is not available. Performed at [facility], [address], [place], [place] [address]    Report Status 09/10/2022 FINAL  Final   Organism ID, Bacteria METHICILLIN RESISTANT STAPHYLOCOCCUS AUREUS (A)  Final      Susceptibility   Methicillin resistant staphylococcus aureus - MIC*    CIPROFLOXACIN >=8 RESISTANT Resistant     GENTAMICIN <=0.5 SENSITIVE Sensitive     NITROFURANTOIN <=16 SENSITIVE Sensitive     OXACILLIN >=4 RESISTANT Resistant     TETRACYCLINE <=1 SENSITIVE Sensitive     VANCOMYCIN <=0.5 SENSITIVE Sensitive     TRIMETH/SULFA <=10 SENSITIVE Sensitive     CLINDAMYCIN <=0.25 SENSITIVE Sensitive     RIFAMPIN <=0.5 SENSITIVE Sensitive     Inducible Clindamycin NEGATIVE Sensitive     LINEZOLID 2 SENSITIVE Sensitive     * 30,000 COLONIES/mL METHICILLIN RESISTANT STAPHYLOCOCCUS AUREUS  Culture, blood (Routine X 2) w Reflex to ID Panel     Status: None (Preliminary result)   Collection Time: 09/10/22 10:28 AM   Specimen: BLOOD RIGHT ARM  Result Value Ref Range Status    Specimen Description BLOOD RIGHT ARM  Final   Special Requests   Final    BOTTLES DRAWN AEROBIC AND ANAEROBIC Blood Culture adequate volume   Culture   Final    NO GROWTH 3 DAYS Performed at [facility], [address] [place], [place] [address]    Report Status PENDING  Incomplete  Culture, blood (Routine X 2) w Reflex to ID Panel     Status: None (Preliminary result)   Collection Time: 09/10/22 10:28 AM   Specimen: BLOOD  Result Value Ref Range Status   Specimen Description BLOOD SITE NOT SPECIFIED  Final   Special Requests   Final    BOTTLES DRAWN AEROBIC AND ANAEROBIC Blood Culture adequate volume   Culture   Final    NO GROWTH 3 DAYS Performed at [facility], [address], [place], [place] [address]    Report Status PENDING  Incomplete    Please note: You were cared for by a hospitalist during your hospital stay. Once you are discharged, your primary care physician will handle any further medical issues. Please note that NO REFILLS for any discharge medications will be authorized once you are discharged, as it is imperative that you return to your primary care physician (or establish a relationship with a primary care physician if you do not have one) for your post hospital discharge needs so that they can reassess your need for medications and monitor your lab values.    Time coordinating discharge:  40 minutes  SIGNED:   Burnadette Pop, MD  [facility] 09/13/2022, 1:11 PM Pager [phone]  If 7PM-7AM, please contact night-coverage [email or web address] Password [email or web address]

## 2022-09-13 NOTE — Progress Notes (Signed)
JP drain removed without difficulty per MD order.

## 2022-09-13 NOTE — TOC CAGE-AID Note (Signed)
Transition of Care [facility]) - CAGE-AID Screening   Patient Details  Name: Noah Rosales MRN: [id number] Date of Birth: 07-14-1960  [facility]) CM/SW Contact:    Kermit Balo, RN Phone Number: 09/13/2022, 11:01 AM   Clinical Narrative: Pt denied the need for inpatient/ outpatient alcohol counseling.   CAGE-AID Screening:    Have You Ever Felt You Ought to Cut Down on Your Drinking or Drug Use?: Yes Have People Annoyed You By Critizing Your Drinking Or Drug Use?: No Have You Felt Bad Or Guilty About Your Drinking Or Drug Use?: No Have You Ever Had a Drink or Used Drugs First Thing In The Morning to Steady Your Nerves or to Get Rid of a Hangover?: No CAGE-AID Score: 1  Substance Abuse Education Offered: Yes

## 2022-09-13 NOTE — Evaluation (Signed)
Occupational Therapy Evaluation Patient Details Name: Noah Rosales MRN: [id number] DOB: September 29, 1960 Today's Date: 09/13/2022   History of Present Illness The pt is a 62 yo male presenting 5/26 with flank, back, and groin pain, passing 1-12 stones at home. Pt found to have gross hematuria with clots. Imaging also showed bladder mass and renal mass. R nephrostomy tube placed 5/28. Hospitalization complicated by CIWA of 22. PMH includes: alcohol abuse, CAD s/p CABG x4, GERD, and MI.   Clinical Impression   Prior to this admission, patient ambulatory with a RW, or occasionally furniture surfing, required assist for his lower body dressing, and still driving. Currently, patient presenting with decreased activity tolerance, and need for min A with ADLs and functional ambulation. Patient requiring min A for short distances, benefiting from seated rest breaks, and endorsing his weakness to "not really getting up over the past 4 days." Patient educated on [facility], however patient states he probably does not need it as he has help from his girlfriend. OT will continue to follow, however anticipate no OT needs at discharge.      Recommendations for follow up therapy are one component of a multi-disciplinary discharge planning process, led by the attending physician.  Recommendations may be updated based on patient status, additional functional criteria and insurance authorization.   Assistance Recommended at Discharge Frequent or constant Supervision/Assistance  Patient can return home with the following A little help with walking and/or transfers;A little help with bathing/dressing/bathroom;Assist for transportation;Help with stairs or ramp for entrance    Functional Status Assessment  Patient has had a recent decline in their functional status and demonstrates the ability to make significant improvements in function in a reasonable and predictable amount of time.  Equipment Recommendations  None  recommended by OT    Recommendations for Other Services       Precautions / Restrictions Precautions Precautions: Fall Restrictions Weight Bearing Restrictions: No      Mobility Bed Mobility Overal bed mobility: Needs Assistance Bed Mobility: Supine to Sit     Supine to sit: Supervision     General bed mobility comments: supervision with pt using bed rails and momentum    Transfers Overall transfer level: Needs assistance Equipment used: Rolling walker (2 wheels) Transfers: Sit to/from Stand Sit to Stand: Min guard           General transfer comment: minG with increased time, minA for ease but pt can eventually get to standing without assist. dependent on UE support and use of momentum to power up to standing      Balance Overall balance assessment: Needs assistance Sitting-balance support: No upper extremity supported, Feet supported Sitting balance-Leahy Scale: Good     Standing balance support: Bilateral upper extremity supported, During functional activity, Reliant on assistive device for balance Standing balance-Leahy Scale: Poor                             ADL either performed or assessed with clinical judgement   ADL Overall ADL's : Needs assistance/impaired Eating/Feeding: Set up;Sitting   Grooming: Wash/dry face;Wash/dry hands;Min guard Grooming Details (indicate cue type and reason): min gaurd for safety in standing Upper Body Bathing: Min guard;Sitting   Lower Body Bathing: Minimal assistance;Sit to/from stand;Sitting/lateral leans   Upper Body Dressing : Min guard;Sitting   Lower Body Dressing: Minimal assistance;Sitting/lateral leans;Sit to/from stand   Toilet Transfer: Minimal assistance;Ambulation;Rolling walker (2 wheels)   Toileting- Clothing Manipulation and Hygiene: Minimal  assistance;Sit to/from stand;Sitting/lateral lean       Functional mobility during ADLs: Minimal assistance;Cueing for safety;Cueing for  sequencing;Rolling walker (2 wheels) General ADL Comments: Patient presenting with decreased activity tolerance, and need for min A with ADLs and functional ambulation.     Vision Baseline Vision/History: 1 Wears glasses (Readers) Ability to See in Adequate Light: 0 Adequate Patient Visual Report: No change from baseline Vision Assessment?: No apparent visual deficits     Perception     Praxis      Pertinent Vitals/Pain Pain Assessment Pain Assessment: Faces Faces Pain Scale: Hurts little more Pain Location: knees with gait, no pain at rest Pain Descriptors / Indicators: Discomfort, Grimacing Pain Intervention(s): Limited activity within patient's tolerance, Monitored during session, Repositioned     Hand Dominance Right   Extremity/Trunk Assessment Upper Extremity Assessment Upper Extremity Assessment: Generalized weakness;Overall [facility] for tasks assessed   Lower Extremity Assessment Lower Extremity Assessment: Defer to PT evaluation   Cervical / Trunk Assessment Cervical / Trunk Assessment: Normal   Communication Communication Communication: No difficulties   Cognition Arousal/Alertness: Awake/alert Behavior During Therapy: WFL for tasks assessed/performed Overall Cognitive Status: Within Functional Limits for tasks assessed                                 General Comments: Minimally impulsive, CIWA protocol, alert and participatory, aware of safety deficits     General Comments  VSS on RA    Exercises     Shoulder Instructions      Home Living Family/patient expects to be discharged to:: Private residence Living Arrangements: Spouse/significant other Available Help at Discharge: Family;Available 24 hours/day Type of Home: Mobile home Home Access: Stairs to enter;Ramped entrance Entrance Stairs-Number of Steps: 2 Entrance Stairs-Rails: Can reach both Home Layout: One level     Bathroom Shower/Tub: Chief Strategy Officer:  Standard     Home Equipment: Agricultural consultant (2 wheels);Shower seat;Grab bars - tub/shower          Prior Functioning/Environment Prior Level of Function : Independent/Modified Independent;Driving             Mobility Comments: walked with a RW occasionally ADLs Comments: difficulty with lower body dressing at baseline        OT Problem List: Decreased strength;Decreased activity tolerance;Impaired balance (sitting and/or standing);Decreased coordination;Decreased safety awareness;Decreased knowledge of precautions;Decreased knowledge of use of DME or AE;Pain      OT Treatment/Interventions: Self-care/ADL training;Therapeutic exercise;Energy conservation;DME and/or AE instruction;Manual therapy;Therapeutic activities;Patient/family education;Balance training    OT Goals(Current goals can be found in the care plan section) Acute Rehab OT Goals Patient Stated Goal: to go home OT Goal Formulation: With patient Time For Goal Achievement: 09/27/22 Potential to Achieve Goals: Good  OT Frequency: Min 2X/week    Co-evaluation PT/OT/SLP Co-Evaluation/Treatment: Yes Reason for Co-Treatment: For patient/therapist safety;To address functional/ADL transfers PT goals addressed during session: Mobility/safety with mobility;Proper use of DME;Balance OT goals addressed during session: ADL's and self-care;Strengthening/ROM      AM-PAC OT "6 Clicks" Daily Activity     Outcome Measure Help from another person eating meals?: None Help from another person taking care of personal grooming?: A Little Help from another person toileting, which includes using toliet, bedpan, or urinal?: A Little Help from another person bathing (including washing, rinsing, drying)?: A Little Help from another person to put on and taking off regular upper body clothing?: A Little Help from another  person to put on and taking off regular lower body clothing?: A Little 6 Click Score: 19   End of Session Equipment  Utilized During Treatment: Gait belt;Rolling walker (2 wheels) Nurse Communication: Mobility status  Activity Tolerance: Patient tolerated treatment well Patient left: with call bell/phone within reach;in chair;with chair alarm set  OT Visit Diagnosis: Unsteadiness on feet (R26.81);Other abnormalities of gait and mobility (R26.89);Pain;Muscle weakness (generalized) (M62.81) Pain - part of body: Knee                Time: 1610-9604 OT Time Calculation (min): 22 min Charges:  OT General Charges $OT Visit: 1 Visit OT Evaluation $OT Eval Moderate Complexity: 1 Mod  Pollyann Glen E. Parke Jandreau, OTR/L Acute Rehabilitation Services [phone]   Cherlyn Cushing 09/13/2022, 1:47 PM

## 2022-09-13 NOTE — Evaluation (Signed)
Physical Therapy Evaluation Patient Details Name: Noah Rosales MRN: [id number] DOB: 1960/10/01 Today's Date: 09/13/2022  History of Present Illness  The pt is a 62 yo male presenting 5/26 with flank, back, and groin pain, passing 1-12 stones at home. Pt found to have gross hematuria with clots. Imaging also showed bladder mass and renal mass. R nephrostomy tube placed 5/28. Hospitalization complicated by CIWA of 22. PMH includes: alcohol abuse, CAD s/p CABG x4, GERD, and MI.   Clinical Impression  Pt in bed upon arrival of PT, agreeable to evaluation at this time. Prior to admission the pt was independent with mobility, and reports intermittent use of RW. The pt was able to complete initial sit-stand transfer and short-distance ambulation with use of RW and minG for safety this session. He reports pain and stiffness in knees from immobility, but had no instances of buckling or LOB with gait. Discussed HHPT to improve stability and endurance after return home, pt agrees he has deficits but is unsure if he would like follow up therapies.     Recommendations for follow up therapy are one component of a multi-disciplinary discharge planning process, led by the attending physician.  Recommendations may be updated based on patient status, additional functional criteria and insurance authorization.  Follow Up Recommendations       Assistance Recommended at Discharge Frequent or constant Supervision/Assistance  Patient can return home with the following  A little help with walking and/or transfers;A little help with bathing/dressing/bathroom;Assistance with cooking/housework;Assist for transportation;Help with stairs or ramp for entrance    Equipment Recommendations None recommended by PT  Recommendations for Other Services       Functional Status Assessment Patient has had a recent decline in their functional status and demonstrates the ability to make significant improvements in function in a  reasonable and predictable amount of time.     Precautions / Restrictions Precautions Precautions: Fall Restrictions Weight Bearing Restrictions: No      Mobility  Bed Mobility Overal bed mobility: Needs Assistance Bed Mobility: Supine to Sit     Supine to sit: Supervision     General bed mobility comments: supervision with pt using bed rails and momentum    Transfers Overall transfer level: Needs assistance Equipment used: Rolling walker (2 wheels) Transfers: Sit to/from Stand Sit to Stand: Min guard           General transfer comment: minG with increased time, minA for ease but pt can eventually get to standing without assist. dependent on UE support and use of momentum to power up to standing    Ambulation/Gait Ambulation/Gait assistance: Min guard Gait Distance (Feet): 10 Feet (+ 25 ft) Assistive device: Rolling walker (2 wheels) Gait Pattern/deviations: Step-to pattern, Decreased stride length, Shuffle Gait velocity: decreased     General Gait Details: pt with increased sway and minimal clearance/stride length. no overt LOB     Balance Overall balance assessment: Needs assistance Sitting-balance support: No upper extremity supported, Feet supported Sitting balance-Leahy Scale: Good     Standing balance support: Bilateral upper extremity supported, During functional activity, Reliant on assistive device for balance Standing balance-Leahy Scale: Poor                               Pertinent Vitals/Pain Pain Assessment Pain Assessment: Faces Faces Pain Scale: Hurts little more Pain Location: knees with gait, no pain at rest Pain Descriptors / Indicators: Discomfort, Grimacing Pain Intervention(s): Limited activity  within patient's tolerance, Monitored during session, Repositioned    Home Living Family/patient expects to be discharged to:: Private residence Living Arrangements: Spouse/significant other Available Help at Discharge:  Family;Available 24 hours/day Type of Home: Mobile home Home Access: Stairs to enter;Ramped entrance Entrance Stairs-Rails: Can reach both Entrance Stairs-Number of Steps: 2   Home Layout: One level Home Equipment: Agricultural consultant (2 wheels);Shower seat;Grab bars - tub/shower      Prior Function Prior Level of Function : Independent/Modified Independent;Driving             Mobility Comments: walked with a RW occasionally ADLs Comments: difficulty with lower body dressing at baseline     Hand Dominance   Dominant Hand: Right    Extremity/Trunk Assessment   Upper Extremity Assessment Upper Extremity Assessment: Defer to OT evaluation    Lower Extremity Assessment Lower Extremity Assessment: Generalized weakness (poor power, pt dependent on momentum and UE to stand, no focal deficits)    Cervical / Trunk Assessment Cervical / Trunk Assessment: Normal  Communication   Communication: No difficulties  Cognition Arousal/Alertness: Awake/alert Behavior During Therapy: WFL for tasks assessed/performed Overall Cognitive Status: Within Functional Limits for tasks assessed                                 General Comments: Minimally impulsive, CIWA protocol, alert and participatory, aware of safety deficits        General Comments General comments (skin integrity, edema, etc.): VSS on RA        Assessment/Plan    PT Assessment Patient needs continued PT services  PT Problem List Decreased strength;Decreased activity tolerance;Decreased balance       PT Treatment Interventions DME instruction;Gait training;Stair training;Functional mobility training;Therapeutic activities;Therapeutic exercise;Balance training;Patient/family education    PT Goals (Current goals can be found in the Care Plan section)  Acute Rehab PT Goals Patient Stated Goal: return home today PT Goal Formulation: With patient Time For Goal Achievement: 09/27/22 Potential to Achieve  Goals: Good    Frequency Min 3X/week     Co-evaluation PT/OT/SLP Co-Evaluation/Treatment: Yes Reason for Co-Treatment: For patient/therapist safety;To address functional/ADL transfers PT goals addressed during session: Mobility/safety with mobility;Proper use of DME;Balance OT goals addressed during session: ADL's and self-care;Strengthening/ROM       AM-PAC PT "6 Clicks" Mobility  Outcome Measure Help needed turning from your back to your side while in a flat bed without using bedrails?: None Help needed moving from lying on your back to sitting on the side of a flat bed without using bedrails?: None Help needed moving to and from a bed to a chair (including a wheelchair)?: A Little Help needed standing up from a chair using your arms (e.g., wheelchair or bedside chair)?: A Little Help needed to walk in hospital room?: A Little Help needed climbing 3-5 steps with a railing? : A Lot 6 Click Score: 19    End of Session Equipment Utilized During Treatment: Gait belt Activity Tolerance: Patient tolerated treatment well;Patient limited by fatigue Patient left: in chair;with call bell/phone within reach;with chair alarm set Nurse Communication: Mobility status PT Visit Diagnosis: Other abnormalities of gait and mobility (R26.89);Muscle weakness (generalized) (M62.81)    Time: 2956-2130 PT Time Calculation (min) (ACUTE ONLY): 22 min   Charges:   PT Evaluation $PT Eval Low Complexity: 1 Low          Vickki Muff, PT, DPT   Acute Rehabilitation Department Office [phone]  Secure Chat Communication Preferred  Ronnie Derby 09/13/2022, 12:15 PM

## 2022-09-13 NOTE — Progress Notes (Signed)
Discharge instructions given. Patient verbalized understanding and all questions were answered.  ?

## 2022-09-13 NOTE — Progress Notes (Signed)
PT Cancellation Note  Patient Details Name: EZAAN CLAUDIO MRN: [id number] DOB: May 23, 1960   Cancelled Treatment:    Reason Eval/Treat Not Completed: Patient at procedure or test/unavailable upon arrival of PT, off unit for CT. Will continue to follow and check back later this morning for evaluation.   Vickki Muff, PT, DPT   Acute Rehabilitation Department Office [phone] Secure Chat Communication Preferred   Ronnie Derby 09/13/2022, 10:06 AM

## 2022-09-13 NOTE — TOC Transition Note (Signed)
Transition of Care [facility]) - CM/SW Discharge Note   Patient Details  Name: Noah Rosales MRN: [id number] Date of Birth: 04-17-1960  Transition of Care [facility]) CM/SW Contact:  Kermit Balo, RN Phone Number: 09/13/2022, 1:38 PM   Clinical Narrative:    Pt is discharging home with home health services through [place] [facility]. Information on the AVS.  PCP appt made with [facility] and is on AVS.  Pt has transportation home.   Final next level of care: Home w Home Health Services Barriers to Discharge: No Barriers Identified   Patient Goals and CMS Choice CMS Medicare.gov Compare Post Acute Care list provided to:: Patient Choice offered to / list presented to : Patient  Discharge Placement                         Discharge Plan and Services Additional resources added to the After Visit Summary for     Discharge Planning Services: CM Consult Post Acute Care Choice: Home Health                    HH Arranged: RN [facility] Agency: [facility] Date [facility] Agency Contacted: 09/12/22 Time HH Agency Contacted: [phone] Representative spoke with at [facility] Agency: Kandee Keen  Social Determinants of Health (SDOH) Interventions SDOH Screenings   Tobacco Use: High Risk (09/10/2022)     Readmission Risk Interventions     No data to display

## 2022-09-14 LAB — CULTURE, BLOOD (ROUTINE X 2): Culture: NO GROWTH

## 2022-09-15 LAB — CULTURE, BLOOD (ROUTINE X 2): Special Requests: ADEQUATE

## 2022-09-16 ENCOUNTER — Emergency Department (HOSPITAL_COMMUNITY)
Admission: EM | Admit: 2022-09-16 | Discharge: 2022-09-17 | Disposition: A | Discharge status: Home / Self Care | Payer: Medicare HMO | Attending: Emergency Medicine | Admitting: Emergency Medicine

## 2022-09-16 ENCOUNTER — Emergency Department (HOSPITAL_COMMUNITY): Payer: Medicare HMO

## 2022-09-16 ENCOUNTER — Encounter (HOSPITAL_COMMUNITY): Payer: Self-pay

## 2022-09-16 DIAGNOSIS — I2510 Atherosclerotic heart disease of native coronary artery without angina pectoris: Secondary | ICD-10-CM | POA: Diagnosis not present

## 2022-09-16 DIAGNOSIS — T839XXA Unspecified complication of genitourinary prosthetic device, implant and graft, initial encounter: Secondary | ICD-10-CM | POA: Diagnosis not present

## 2022-09-16 DIAGNOSIS — T83091A Other mechanical complication of indwelling urethral catheter, initial encounter: Secondary | ICD-10-CM | POA: Diagnosis not present

## 2022-09-16 DIAGNOSIS — R339 Retention of urine, unspecified: Secondary | ICD-10-CM | POA: Diagnosis present

## 2022-09-16 DIAGNOSIS — R310 Gross hematuria: Secondary | ICD-10-CM

## 2022-09-16 DIAGNOSIS — F1020 Alcohol dependence, uncomplicated: Secondary | ICD-10-CM | POA: Diagnosis not present

## 2022-09-16 DIAGNOSIS — K5730 Diverticulosis of large intestine without perforation or abscess without bleeding: Secondary | ICD-10-CM | POA: Diagnosis not present

## 2022-09-16 DIAGNOSIS — R319 Hematuria, unspecified: Secondary | ICD-10-CM | POA: Insufficient documentation

## 2022-09-16 DIAGNOSIS — N1330 Unspecified hydronephrosis: Secondary | ICD-10-CM | POA: Diagnosis not present

## 2022-09-16 DIAGNOSIS — I959 Hypotension, unspecified: Secondary | ICD-10-CM | POA: Diagnosis not present

## 2022-09-16 LAB — CBC WITH DIFFERENTIAL/PLATELET
Abs Immature Granulocytes: 0.1 10*3/uL — ABNORMAL HIGH (ref 0.00–0.07)
Basophils Absolute: 0 10*3/uL (ref 0.0–0.1)
Basophils Relative: 0 %
Eosinophils Absolute: 0.3 10*3/uL (ref 0.0–0.5)
Eosinophils Relative: 2 %
HCT: 26.4 % — ABNORMAL LOW (ref 39.0–52.0)
Hemoglobin: 8.5 g/dL — ABNORMAL LOW (ref 13.0–17.0)
Immature Granulocytes: 1 %
Lymphocytes Relative: 12 %
Lymphs Abs: 1.7 10*3/uL (ref 0.7–4.0)
MCH: 30.6 pg (ref 26.0–34.0)
MCHC: 32.2 g/dL (ref 30.0–36.0)
MCV: 95 fL (ref 80.0–100.0)
Monocytes Absolute: 1.1 10*3/uL — ABNORMAL HIGH (ref 0.1–1.0)
Monocytes Relative: 8 %
Neutro Abs: 10.7 10*3/uL — ABNORMAL HIGH (ref 1.7–7.7)
Neutrophils Relative %: 77 %
Platelets: 448 10*3/uL — ABNORMAL HIGH (ref 150–400)
RBC: 2.78 MIL/uL — ABNORMAL LOW (ref 4.22–5.81)
RDW: 15.5 % (ref 11.5–15.5)
WBC: 14 10*3/uL — ABNORMAL HIGH (ref 4.0–10.5)
nRBC: 0 % (ref 0.0–0.2)

## 2022-09-16 LAB — COMPREHENSIVE METABOLIC PANEL
ALT: 34 U/L (ref 0–44)
AST: 35 U/L (ref 15–41)
Albumin: 2.4 g/dL — ABNORMAL LOW (ref 3.5–5.0)
Alkaline Phosphatase: 73 U/L (ref 38–126)
Anion gap: 12 (ref 5–15)
BUN: 5 mg/dL — ABNORMAL LOW (ref 8–23)
CO2: 24 mmol/L (ref 22–32)
Calcium: 8 mg/dL — ABNORMAL LOW (ref 8.9–10.3)
Chloride: 99 mmol/L (ref 98–111)
Creatinine, Ser: 0.83 mg/dL (ref 0.61–1.24)
GFR, Estimated: >60 mL/min (ref >60)
Glucose, Bld: 90 mg/dL (ref 70–99)
Potassium: 3.3 mmol/L — ABNORMAL LOW (ref 3.5–5.1)
Sodium: 135 mmol/L (ref 135–145)
Total Bilirubin: 1 mg/dL (ref 0.3–1.2)
Total Protein: 5.4 g/dL — ABNORMAL LOW (ref 6.5–8.1)

## 2022-09-16 LAB — PROTIME-INR
INR: 1.2 (ref 0.8–1.2)
Prothrombin Time: 14.9 seconds (ref 11.4–15.2)

## 2022-09-16 MED ORDER — LACTATED RINGERS IV BOLUS
500.0000 mL | Freq: Once | INTRAVENOUS | Status: AC
  Administered 2022-09-16: 500 mL via INTRAVENOUS

## 2022-09-16 MED ORDER — IOHEXOL 350 MG/ML SOLN
75.0000 mL | Freq: Once | INTRAVENOUS | Status: AC | PRN
  Administered 2022-09-16: 75 mL via INTRAVENOUS

## 2022-09-16 NOTE — ED Triage Notes (Addendum)
Pt BIBA from home. Pt was just d/c from here Friday. Pt has had blood clots in his foley and is retaining urine. Pt states he attempted to flush it without success. Also tried having a few beers to flush it without relief.

## 2022-09-16 NOTE — ED Provider Notes (Signed)
[place] EMERGENCY DEPARTMENT AT [facility] Provider Note   CSN: [id number] Arrival date & time: 09/16/22  1901     History {Add pertinent medical, surgical, social history, OB history to HPI:1} Chief Complaint  Patient presents with   Urinary Retention   Hematuria    Noah Rosales is a 62 y.o. male.   Hematuria  Patient presents for urine retention.  Medical history includes alcoholism, CAD.  He presented to the ED 1 week ago for hematuria and flank pain.  He underwent cystoscopy with clot evacuation converted to open bladder exploration with Dr. Liliane Shi.  He had right nephrostomy tube placed the following day.  He was discharged from the hospital 4 days ago.  Patient reports that he had clear urine from his Foley catheter up until today.  Today, his Foley catheter stopped draining and he felt suprapubic pressure.  He attempted flushing at home unsuccessfully.  After his arrival in the ED, he did have urine output into his Foley bag.  At this time, he had recurrence of hematuria.  Patient denies any significant discomfort at this time.  He states that his right PCN tube has been draining appropriately.     Home Medications Prior to Admission medications   Medication Sig Start Date End Date Taking? Authorizing Provider  doxycycline (VIBRA-TABS) 100 MG tablet Take 1 tablet (100 mg total) by mouth 2 (two) times daily for 3 days. 09/14/22 09/17/22  Burnadette Pop, MD  folic acid (FOLVITE) 1 MG tablet Take 1 tablet (1 mg total) by mouth daily. 09/14/22   Burnadette Pop, MD  polyethylene glycol powder (GLYCOLAX/MIRALAX) 17 GM/SCOOP powder Take 17 g by mouth daily. 09/14/22   Burnadette Pop, MD  potassium chloride SA (KLOR-CON M) 20 MEQ tablet Take 2 tablets (40 mEq total) by mouth daily for 5 days. 09/14/22 09/19/22  Burnadette Pop, MD  thiamine (VITAMIN B1) 100 MG tablet Take 1 tablet (100 mg total) by mouth daily. 09/14/22   Burnadette Pop, MD      Allergies    Sertraline hcl     Review of Systems   Review of Systems  Genitourinary:  Positive for hematuria.  All other systems reviewed and are negative.   Physical Exam Updated Vital Signs BP 105/63 (BP Location: Right Arm)   Pulse 94   Temp 98.4 F (36.9 C) (Oral)   Resp 16   SpO2 96%  Physical Exam Vitals and nursing note reviewed.  Constitutional:      General: He is not in acute distress.    Appearance: Normal appearance. He is well-developed. He is not ill-appearing, toxic-appearing or diaphoretic.  HENT:     Head: Normocephalic and atraumatic.     Right Ear: External ear normal.     Left Ear: External ear normal.     Nose: Nose normal.     Mouth/Throat:     Mouth: Mucous membranes are moist.  Eyes:     Extraocular Movements: Extraocular movements intact.     Conjunctiva/sclera: Conjunctivae normal.  Cardiovascular:     Rate and Rhythm: Normal rate and regular rhythm.     Heart sounds: No murmur heard. Pulmonary:     Effort: Pulmonary effort is normal. No respiratory distress.  Abdominal:     General: There is no distension.     Palpations: Abdomen is soft.     Comments: Midline surgical incision inferior to umbilicus is present with staples in place.  No purulent drainage is appreciated.  Right PCN tube  is in place with clear yellow output into bag.  Genitourinary:    Comments: Foley catheter is present with gross hematuria present in Foley drainage bag.  Urine is the color of fruit punch. Musculoskeletal:        General: No swelling. Normal range of motion.     Cervical back: Normal range of motion and neck supple.     Right lower leg: No edema.     Left lower leg: No edema.  Skin:    General: Skin is warm and dry.     Capillary Refill: Capillary refill takes less than 2 seconds.     Coloration: Skin is not jaundiced or pale.  Neurological:     General: No focal deficit present.     Mental Status: He is alert and oriented to person, place, and time.  Psychiatric:        Mood and  Affect: Mood normal.        Behavior: Behavior normal.     ED Results / Procedures / Treatments   Labs (all labs ordered are listed, but only abnormal results are displayed) Labs Reviewed  COMPREHENSIVE METABOLIC PANEL  CBC WITH DIFFERENTIAL/PLATELET  PROTIME-INR    EKG None  Radiology No results found.  Procedures Procedures  {Document cardiac monitor, telemetry assessment procedure when appropriate:1}  Medications Ordered in ED Medications - No data to display  ED Course/ Medical Decision Making/ A&P   {   Click here for ABCD2, HEART and other calculatorsREFRESH Note before signing :1}                          Medical Decision Making  This patient presents to the ED for concern of ***, this involves an extensive number of treatment options, and is a complaint that carries with it a high risk of complications and morbidity.  The differential diagnosis includes ***   Co morbidities that complicate the patient evaluation  ***   Additional history obtained:  Additional history obtained from *** External records from outside source obtained and reviewed including ***   Lab Tests:  I Ordered, and personally interpreted labs.  The pertinent results include:  ***   Imaging Studies ordered:  I ordered imaging studies including ***  I independently visualized and interpreted imaging which showed *** I agree with the radiologist interpretation   Cardiac Monitoring: / EKG:  The patient was maintained on a cardiac monitor.  I personally viewed and interpreted the cardiac monitored which showed an underlying rhythm of: ***   Consultations Obtained:  I requested consultation with the ***,  and discussed lab and imaging findings as well as pertinent plan - they recommend: ***   Problem List / ED Course / Critical interventions / Medication management  Patient presents with initial concern of Foley not draining.  Since his arrival in the ED, he has had  resolution of this.  He now has hematuria in his Foley bag.  Although patient was experiencing suprapubic fullness earlier today, he states that this has resolved.  He did undergo recent surgery.  Surgical site was inspected and does appear to be well-healing.  Staples remain in place.  Lab work and CT imaging were ordered.***. I ordered medication including ***  for ***  Reevaluation of the patient after these medicines showed that the patient {resolved/improved/worsened:23923::"improved"} I have reviewed the patients home medicines and have made adjustments as needed   Social Determinants of Health:  ***  Test / Admission - Considered:  ***   {Document critical care time when appropriate:1} {Document review of labs and clinical decision tools ie heart score, Chads2Vasc2 etc:1}  {Document your independent review of radiology images, and any outside records:1} {Document your discussion with family members, caretakers, and with consultants:1} {Document social determinants of health affecting pt's care:1} {Document your decision making why or why not admission, treatments were needed:1} Final Clinical Impression(s) / ED Diagnoses Final diagnoses:  None    Rx / DC Orders ED Discharge Orders     None

## 2022-09-16 NOTE — ED Provider Triage Note (Signed)
Emergency Medicine Provider Triage Evaluation Note  Noah Rosales , a 62 y.o. male  was evaluated in triage.  Pt complains of blood and clots in foley tube. Recently had clot hematuria and operation for bladder rupture.  Review of Systems  Positive: Lower abdominal pain Negative: Flank pain, fevers or chills  Physical Exam  BP 105/63 (BP Location: Right Arm)   Pulse 94   Temp 98.4 F (36.9 C) (Oral)   Resp 16   SpO2 96%  Gen:   Awake, no distress   Resp:  Normal effort  MSK:   Moves extremities without difficulty  Other:  Foley catheter in place draining red urine with clots. Nephrostomy tube draining clear urine  Medical Decision Making  Medically screening exam initiated at 8:23 PM.  Appropriate orders placed.  Noah Rosales was informed that the remainder of the evaluation will be completed by another provider, this initial triage assessment does not replace that evaluation, and the importance of remaining in the ED until their evaluation is complete.     Lonell Grandchild, MD 09/16/22 2024

## 2022-09-17 NOTE — Discharge Instructions (Signed)
You were seen today with difficulty of your Foley catheter draining.  Continue to irrigate as needed.  Call the urology office later this morning given your increased bloody urine output.

## 2022-09-17 NOTE — ED Provider Notes (Signed)
Patient signed out pending CT scan.  Patient presented with concerns for malfunctioning catheter and increased hematuria.  Catheter began to drain spontaneously upon arrival.  He has had increased gross hematuria.  Urology was consulted by Dr. Durwin Nora who recommended CT scan.  I reviewed the CT scan.  Patient does have what appear to be blood products in his bladder.  Additionally he has some increased low-density loosely organized fluid extending through the abdominal wall incision site.  Clinically his incision site is clean, dry, intact.  No signs or symptoms of infection or drainage.  Suspect this may be a developing seroma.  He is nontender.  I have lower suspicion for infection.  Recommend close follow-up with urology later this morning.  Patient is agreeable to plan. Physical Exam  BP (!) 121/52   Pulse 88   Temp 98.7 F (37.1 C) (Oral)   Resp (!) 24   SpO2 96%   Physical Exam Awake, alert, no acute distress Right nephrostomy tube in place Midline lower abdominal incision clean dry and intact with staples in place, nontender Procedures  Procedures  ED Course / MDM    Medical Decision Making  Problem List Items Addressed This Visit       Genitourinary   Gross hematuria - Primary   Other Visit Diagnoses     Foley catheter problem, initial encounter [facility])                 Shon Baton, MD 09/17/22 0005

## 2022-09-23 ENCOUNTER — Telehealth: Payer: Self-pay

## 2022-09-23 NOTE — Telephone Encounter (Signed)
Transition Care Management Unsuccessful Follow-up Telephone Call  Date of discharge and from where:  [facility] 6/5  Attempts:  2nd Attempt  Reason for unsuccessful TCM follow-up call:  Left voice message   Lenard Forth [facility] Guide, [facility] [phone] [address] [place], [place], [place] [address] Phone: [phone] Email: [email or web address]

## 2022-09-23 NOTE — Telephone Encounter (Signed)
Transition Care Management Unsuccessful Follow-up Telephone Call  Date of discharge and from where:  [facility]  Attempts:  1st Attempt  Reason for unsuccessful TCM follow-up call:  Left voice message   Lenard Forth [facility] Guide, [facility] [phone] [address] [place], [place], [place] [address] Phone: [phone] Email: [email or web address]

## 2022-09-26 ENCOUNTER — Ambulatory Visit: Payer: Medicare HMO

## 2022-09-27 DIAGNOSIS — C678 Malignant neoplasm of overlapping sites of bladder: Secondary | ICD-10-CM | POA: Diagnosis not present

## 2022-09-27 DIAGNOSIS — S3729XD Other injury of bladder, subsequent encounter: Secondary | ICD-10-CM | POA: Diagnosis not present

## 2022-10-08 ENCOUNTER — Ambulatory Visit: Payer: Medicare HMO | Admitting: Student

## 2022-10-08 DIAGNOSIS — C678 Malignant neoplasm of overlapping sites of bladder: Secondary | ICD-10-CM | POA: Diagnosis not present

## 2022-10-08 DIAGNOSIS — R8271 Bacteriuria: Secondary | ICD-10-CM | POA: Diagnosis not present

## 2022-10-14 ENCOUNTER — Other Ambulatory Visit (HOSPITAL_COMMUNITY): Payer: Self-pay

## 2022-11-11 ENCOUNTER — Other Ambulatory Visit (HOSPITAL_COMMUNITY): Payer: Self-pay | Admitting: Interventional Radiology

## 2022-11-11 ENCOUNTER — Ambulatory Visit (HOSPITAL_COMMUNITY)
Admission: RE | Admit: 2022-11-11 | Discharge: 2022-11-11 | Disposition: A | Discharge status: Home / Self Care | Payer: Medicare HMO | Source: Ambulatory Visit | Attending: Physician Assistant | Admitting: Physician Assistant

## 2022-11-11 DIAGNOSIS — R338 Other retention of urine: Secondary | ICD-10-CM | POA: Insufficient documentation

## 2022-11-11 DIAGNOSIS — N2889 Other specified disorders of kidney and ureter: Secondary | ICD-10-CM

## 2022-11-11 DIAGNOSIS — Z436 Encounter for attention to other artificial openings of urinary tract: Secondary | ICD-10-CM | POA: Diagnosis not present

## 2022-11-11 DIAGNOSIS — N135 Crossing vessel and stricture of ureter without hydronephrosis: Secondary | ICD-10-CM | POA: Diagnosis not present

## 2022-11-11 HISTORY — PX: IR NEPHROSTOMY EXCHANGE RIGHT: IMG6070

## 2022-11-11 MED ORDER — LIDOCAINE HCL (PF) 1 % IJ SOLN
20.0000 mL | Freq: Once | INTRAMUSCULAR | Status: AC
  Administered 2022-11-11: 5 mL via INTRADERMAL

## 2022-11-11 MED ORDER — LIDOCAINE HCL 1 % IJ SOLN
INTRAMUSCULAR | Status: AC
  Filled 2022-11-11: qty 20

## 2022-11-11 MED ORDER — IOHEXOL 300 MG/ML  SOLN
50.0000 mL | Freq: Once | INTRAMUSCULAR | Status: AC | PRN
  Administered 2022-11-11: 10 mL

## 2022-11-11 NOTE — Procedures (Signed)
Interventional Radiology Procedure Note  Procedure: FLUORO 10 FR RT PCN EXCHG    Complications: None  Estimated Blood Loss:  0  Findings: FULL REPORT IN PACS     Sharen Counter, MD

## 2022-11-18 ENCOUNTER — Other Ambulatory Visit (HOSPITAL_COMMUNITY): Payer: Self-pay

## 2023-01-06 ENCOUNTER — Ambulatory Visit (HOSPITAL_COMMUNITY)
Admission: RE | Admit: 2023-01-06 | Discharge: 2023-01-06 | Disposition: A | Discharge status: Home / Self Care | Payer: Medicare HMO | Source: Ambulatory Visit | Attending: Interventional Radiology | Admitting: Interventional Radiology

## 2023-01-06 ENCOUNTER — Other Ambulatory Visit (HOSPITAL_COMMUNITY): Payer: Self-pay | Admitting: Interventional Radiology

## 2023-01-06 DIAGNOSIS — N2889 Other specified disorders of kidney and ureter: Secondary | ICD-10-CM

## 2023-01-06 DIAGNOSIS — R338 Other retention of urine: Secondary | ICD-10-CM | POA: Diagnosis not present

## 2023-01-06 DIAGNOSIS — T83022A Displacement of nephrostomy catheter, initial encounter: Secondary | ICD-10-CM | POA: Diagnosis not present

## 2023-01-06 HISTORY — PX: IR NEPHROSTOMY EXCHANGE RIGHT: IMG6070

## 2023-01-06 MED ORDER — LIDOCAINE-EPINEPHRINE 1 %-1:100000 IJ SOLN
INTRAMUSCULAR | Status: AC
  Filled 2023-01-06: qty 1

## 2023-01-06 MED ORDER — IOHEXOL 300 MG/ML  SOLN: 50.0000 mL | Freq: Once | INTRAMUSCULAR | Status: DC | PRN

## 2023-01-06 MED ORDER — LIDOCAINE-EPINEPHRINE 1 %-1:100000 IJ SOLN
20.0000 mL | Freq: Once | INTRAMUSCULAR | Status: AC
  Administered 2023-01-06: 1 mL via INTRADERMAL

## 2023-01-13 ENCOUNTER — Other Ambulatory Visit (HOSPITAL_COMMUNITY): Payer: Self-pay

## 2023-01-13 ENCOUNTER — Other Ambulatory Visit: Payer: Self-pay

## 2023-01-21 DIAGNOSIS — D49511 Neoplasm of unspecified behavior of right kidney: Secondary | ICD-10-CM | POA: Diagnosis not present

## 2023-01-21 DIAGNOSIS — N130 Hydronephrosis with ureteropelvic junction obstruction: Secondary | ICD-10-CM | POA: Diagnosis not present

## 2023-01-21 DIAGNOSIS — S3729XD Other injury of bladder, subsequent encounter: Secondary | ICD-10-CM | POA: Diagnosis not present

## 2023-01-21 DIAGNOSIS — C678 Malignant neoplasm of overlapping sites of bladder: Secondary | ICD-10-CM | POA: Diagnosis not present

## 2023-02-24 ENCOUNTER — Other Ambulatory Visit: Payer: Self-pay | Admitting: Radiology

## 2023-02-24 ENCOUNTER — Other Ambulatory Visit (HOSPITAL_COMMUNITY): Payer: Self-pay | Admitting: Radiology

## 2023-02-24 DIAGNOSIS — R338 Other retention of urine: Secondary | ICD-10-CM

## 2023-02-24 DIAGNOSIS — N2889 Other specified disorders of kidney and ureter: Secondary | ICD-10-CM

## 2023-02-24 DIAGNOSIS — N1330 Unspecified hydronephrosis: Secondary | ICD-10-CM

## 2023-02-25 NOTE — H&P (Signed)
Referring Physician(s): Winter,C  Supervising Physician: Simonne Come  Patient Status:  [facility]  Chief Complaint:  "My right kidney tube fell out"  Subjective: Pt known to IR team from right perc nephrostomy placement on 09/10/22, followed by exchanges on 11/11/22 and 01/06/23. 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 His chronic rt PCN fell out on 02/21/23 and pt called IR office on 11/11 to report occurrence. He presents today for new right PCN placement.   Past Medical History:  Diagnosis Date   Alcohol abuse    CAD (coronary artery disease)    GERD (gastroesophageal reflux disease)    Myocardial infarction [facility]) 2005   s/p stent to right coronary artery   Past Surgical History:  Procedure Laterality Date   CYSTOSCOPY WITH RETROGRADE PYELOGRAM, URETEROSCOPY AND STENT PLACEMENT N/A 09/09/2022   Procedure: CYSTOSCOPY WITH CLOT EVACUATION, CYSTOGRAM CONVERTED TO OPEN BLADDER EXPLORATION;  Surgeon: Rene Paci, MD;  Location: [facility] OR;  Service: Urology;  Laterality: N/A;   Exploration of mediastinum, extracorporeal circulation,  11/23/10   Dr Laneta Simmers   IR NEPHROSTOMY EXCHANGE RIGHT  11/11/2022   IR NEPHROSTOMY EXCHANGE RIGHT  01/06/2023   IR NEPHROSTOMY PLACEMENT RIGHT  09/10/2022   s/p cagb x 4  11/22/10   Dr Laneta Simmers      Allergies: Sertraline hcl  Medications: Prior to Admission medications   Medication Sig Start Date End Date Taking? Authorizing Provider  folic acid (FOLVITE) 1 MG tablet Take 1 tablet (1 mg total) by mouth daily. 09/14/22   Burnadette Pop, MD  polyethylene glycol powder (GLYCOLAX/MIRALAX) 17 GM/SCOOP powder Take 17 g by mouth daily. 09/14/22   Burnadette Pop, MD  potassium chloride SA (KLOR-CON M) 20 MEQ tablet Take 2 tablets (40 mEq total) by mouth daily for 5 days. 09/14/22 09/19/22  Burnadette Pop, MD  thiamine (VITAMIN B1) 100 MG tablet Take 1 tablet (100 mg total) by mouth daily. 09/14/22   Burnadette Pop, MD     Vital Signs:   Code Status:   Physical Exam  Imaging: No results found.  Labs:  CBC: Recent Labs    09/11/22 0221 09/12/22 0215 09/13/22 0051 09/16/22 2111  WBC 16.1* 13.1* 10.0 14.0*  HGB 8.0* 8.0* 8.3* 8.5*  HCT 24.4* 24.9* 26.0* 26.4*  PLT 190 227 258 448*    COAGS: Recent Labs    09/10/22 0058 09/16/22 2111  INR 1.3* 1.2    BMP: Recent Labs    09/11/22 0221 09/12/22 0215 09/13/22 0051 09/16/22 2111  NA 139 140 138 135  K 3.5 3.1* 3.0* 3.3*  CL 106 105 102 99  CO2 26 29 29 24   GLUCOSE 94 100* 101* 90  BUN 9 8 7* 5*  CALCIUM 7.3* 7.5* 7.6* 8.0*  CREATININE 0.97 0.89 0.75 0.83  GFRNONAA >60 >60 >60 >60    LIVER FUNCTION TESTS: Recent Labs    09/08/22 1650 09/10/22 0058 09/16/22 2111  BILITOT 1.2 2.4* 1.0  AST 46* 38 35  ALT 33 24 34  ALKPHOS 83 45 73  PROT 6.0* 4.8* 5.4*  ALBUMIN 3.1* 2.4* 2.4*    Assessment and Plan: 62 yo male with PMH sig for alcohol abuse, CAD with prior MI/CABG, GERD, and 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 He is s/p right perc nephrostomy placement on  09/10/22, followed by exchanges on 11/11/22 and 01/06/23. His chronic rt PCN fell out on 02/21/23 and pt called IR office on 11/11 to report occurrence. He presents today for new right PCN placement. Risks and benefits of right  PCN placement was discussed with the patient including, but not limited to, infection, bleeding, significant bleeding causing loss or decrease in renal function or damage to adjacent structures.   All of the patient's questions were answered, patient is agreeable to proceed.  Consent signed and in chart.       Electronically Signed: D. Jeananne Rama, PA-C 02/25/2023, 6:06 PM   I spent a total of 20 minutes at the the patient's bedside AND  on the patient's hospital floor or unit, greater than 50% of which was counseling/coordinating care for right percutaneous nephrostomy

## 2023-02-26 ENCOUNTER — Encounter (HOSPITAL_COMMUNITY): Payer: Self-pay | Admitting: Internal Medicine

## 2023-02-26 ENCOUNTER — Encounter (HOSPITAL_COMMUNITY): Payer: Self-pay

## 2023-02-26 ENCOUNTER — Ambulatory Visit (HOSPITAL_COMMUNITY)
Admission: RE | Admit: 2023-02-26 | Discharge: 2023-02-26 | Disposition: A | Discharge status: Home / Self Care | Payer: Medicare HMO | Source: Ambulatory Visit | Attending: Radiology | Admitting: Radiology

## 2023-02-26 ENCOUNTER — Other Ambulatory Visit: Payer: Self-pay

## 2023-02-26 ENCOUNTER — Inpatient Hospital Stay (HOSPITAL_COMMUNITY)
Admission: EM | Admit: 2023-02-26 | Discharge: 2023-03-02 | DRG: 699 | Disposition: A | Discharge status: Hospice | Payer: Medicare HMO | Attending: Internal Medicine | Admitting: Internal Medicine

## 2023-02-26 DIAGNOSIS — F1010 Alcohol abuse, uncomplicated: Secondary | ICD-10-CM | POA: Insufficient documentation

## 2023-02-26 DIAGNOSIS — R791 Abnormal coagulation profile: Secondary | ICD-10-CM | POA: Diagnosis present

## 2023-02-26 DIAGNOSIS — S3729XA Other injury of bladder, initial encounter: Secondary | ICD-10-CM | POA: Diagnosis not present

## 2023-02-26 DIAGNOSIS — R310 Gross hematuria: Secondary | ICD-10-CM | POA: Diagnosis not present

## 2023-02-26 DIAGNOSIS — E871 Hypo-osmolality and hyponatremia: Secondary | ICD-10-CM | POA: Diagnosis present

## 2023-02-26 DIAGNOSIS — Z515 Encounter for palliative care: Secondary | ICD-10-CM | POA: Diagnosis not present

## 2023-02-26 DIAGNOSIS — C679 Malignant neoplasm of bladder, unspecified: Secondary | ICD-10-CM

## 2023-02-26 DIAGNOSIS — Z6831 Body mass index (BMI) 31.0-31.9, adult: Secondary | ICD-10-CM | POA: Diagnosis not present

## 2023-02-26 DIAGNOSIS — Y738 Miscellaneous gastroenterology and urology devices associated with adverse incidents, not elsewhere classified: Secondary | ICD-10-CM | POA: Diagnosis present

## 2023-02-26 DIAGNOSIS — N35919 Unspecified urethral stricture, male, unspecified site: Secondary | ICD-10-CM | POA: Diagnosis present

## 2023-02-26 DIAGNOSIS — Z955 Presence of coronary angioplasty implant and graft: Secondary | ICD-10-CM | POA: Insufficient documentation

## 2023-02-26 DIAGNOSIS — K219 Gastro-esophageal reflux disease without esophagitis: Secondary | ICD-10-CM | POA: Diagnosis present

## 2023-02-26 DIAGNOSIS — N3289 Other specified disorders of bladder: Secondary | ICD-10-CM | POA: Diagnosis present

## 2023-02-26 DIAGNOSIS — L8990 Pressure ulcer of unspecified site, unspecified stage: Secondary | ICD-10-CM | POA: Insufficient documentation

## 2023-02-26 DIAGNOSIS — N136 Pyonephrosis: Secondary | ICD-10-CM | POA: Diagnosis present

## 2023-02-26 DIAGNOSIS — I2510 Atherosclerotic heart disease of native coronary artery without angina pectoris: Secondary | ICD-10-CM | POA: Insufficient documentation

## 2023-02-26 DIAGNOSIS — Y846 Urinary catheterization as the cause of abnormal reaction of the patient, or of later complication, without mention of misadventure at the time of the procedure: Secondary | ICD-10-CM | POA: Diagnosis present

## 2023-02-26 DIAGNOSIS — G9349 Other encephalopathy: Secondary | ICD-10-CM | POA: Diagnosis not present

## 2023-02-26 DIAGNOSIS — Z951 Presence of aortocoronary bypass graft: Secondary | ICD-10-CM

## 2023-02-26 DIAGNOSIS — D62 Acute posthemorrhagic anemia: Secondary | ICD-10-CM | POA: Diagnosis not present

## 2023-02-26 DIAGNOSIS — C689 Malignant neoplasm of urinary organ, unspecified: Secondary | ICD-10-CM

## 2023-02-26 DIAGNOSIS — N2889 Other specified disorders of kidney and ureter: Secondary | ICD-10-CM | POA: Insufficient documentation

## 2023-02-26 DIAGNOSIS — G9340 Encephalopathy, unspecified: Secondary | ICD-10-CM | POA: Diagnosis not present

## 2023-02-26 DIAGNOSIS — N1330 Unspecified hydronephrosis: Secondary | ICD-10-CM

## 2023-02-26 DIAGNOSIS — R062 Wheezing: Secondary | ICD-10-CM | POA: Diagnosis present

## 2023-02-26 DIAGNOSIS — E66811 Obesity, class 1: Secondary | ICD-10-CM | POA: Diagnosis present

## 2023-02-26 DIAGNOSIS — T83091A Other mechanical complication of indwelling urethral catheter, initial encounter: Secondary | ICD-10-CM | POA: Diagnosis not present

## 2023-02-26 DIAGNOSIS — D649 Anemia, unspecified: Secondary | ICD-10-CM | POA: Diagnosis not present

## 2023-02-26 DIAGNOSIS — Z436 Encounter for attention to other artificial openings of urinary tract: Secondary | ICD-10-CM | POA: Diagnosis not present

## 2023-02-26 DIAGNOSIS — Z66 Do not resuscitate: Secondary | ICD-10-CM | POA: Diagnosis present

## 2023-02-26 DIAGNOSIS — F17200 Nicotine dependence, unspecified, uncomplicated: Secondary | ICD-10-CM | POA: Diagnosis present

## 2023-02-26 DIAGNOSIS — C641 Malignant neoplasm of right kidney, except renal pelvis: Secondary | ICD-10-CM | POA: Diagnosis not present

## 2023-02-26 DIAGNOSIS — I700 Atherosclerosis of aorta: Secondary | ICD-10-CM | POA: Insufficient documentation

## 2023-02-26 DIAGNOSIS — N368 Other specified disorders of urethra: Secondary | ICD-10-CM | POA: Diagnosis present

## 2023-02-26 DIAGNOSIS — K8020 Calculus of gallbladder without cholecystitis without obstruction: Secondary | ICD-10-CM | POA: Insufficient documentation

## 2023-02-26 DIAGNOSIS — E669 Obesity, unspecified: Secondary | ICD-10-CM | POA: Diagnosis present

## 2023-02-26 DIAGNOSIS — R000 Tachycardia, unspecified: Secondary | ICD-10-CM | POA: Diagnosis present

## 2023-02-26 DIAGNOSIS — Z8554 Personal history of malignant neoplasm of ureter: Secondary | ICD-10-CM

## 2023-02-26 DIAGNOSIS — F10231 Alcohol dependence with withdrawal delirium: Secondary | ICD-10-CM | POA: Diagnosis not present

## 2023-02-26 DIAGNOSIS — Z888 Allergy status to other drugs, medicaments and biological substances status: Secondary | ICD-10-CM

## 2023-02-26 DIAGNOSIS — I252 Old myocardial infarction: Secondary | ICD-10-CM

## 2023-02-26 DIAGNOSIS — R338 Other retention of urine: Secondary | ICD-10-CM | POA: Insufficient documentation

## 2023-02-26 DIAGNOSIS — G40509 Epileptic seizures related to external causes, not intractable, without status epilepticus: Secondary | ICD-10-CM | POA: Diagnosis not present

## 2023-02-26 DIAGNOSIS — K5730 Diverticulosis of large intestine without perforation or abscess without bleeding: Secondary | ICD-10-CM | POA: Insufficient documentation

## 2023-02-26 DIAGNOSIS — R0602 Shortness of breath: Secondary | ICD-10-CM | POA: Diagnosis present

## 2023-02-26 DIAGNOSIS — Z8551 Personal history of malignant neoplasm of bladder: Secondary | ICD-10-CM | POA: Diagnosis not present

## 2023-02-26 HISTORY — PX: IR NEPHROSTOMY PLACEMENT RIGHT: IMG6064

## 2023-02-26 HISTORY — DX: Dyspnea, unspecified: R06.00

## 2023-02-26 LAB — CBC WITH DIFFERENTIAL/PLATELET
Abs Immature Granulocytes: 0.05 10*3/uL (ref 0.00–0.07)
Basophils Absolute: 0 10*3/uL (ref 0.0–0.1)
Basophils Relative: 0 %
Eosinophils Absolute: 0.1 10*3/uL (ref 0.0–0.5)
Eosinophils Relative: 1 %
HCT: 23.8 % — ABNORMAL LOW (ref 39.0–52.0)
Hemoglobin: 6.3 g/dL — CL (ref 13.0–17.0)
Immature Granulocytes: 1 %
Lymphocytes Relative: 8 %
Lymphs Abs: 0.8 10*3/uL (ref 0.7–4.0)
MCH: 18.8 pg — ABNORMAL LOW (ref 26.0–34.0)
MCHC: 26.5 g/dL — ABNORMAL LOW (ref 30.0–36.0)
MCV: 71 fL — ABNORMAL LOW (ref 80.0–100.0)
Monocytes Absolute: 1.5 10*3/uL — ABNORMAL HIGH (ref 0.1–1.0)
Monocytes Relative: 16 %
Neutro Abs: 7 10*3/uL (ref 1.7–7.7)
Neutrophils Relative %: 74 %
Platelets: 277 10*3/uL (ref 150–400)
RBC: 3.35 MIL/uL — ABNORMAL LOW (ref 4.22–5.81)
RDW: 19.6 % — ABNORMAL HIGH (ref 11.5–15.5)
WBC: 9.4 10*3/uL (ref 4.0–10.5)
nRBC: 0.2 % (ref 0.0–0.2)

## 2023-02-26 LAB — BASIC METABOLIC PANEL
Anion gap: 11 (ref 5–15)
BUN: 14 mg/dL (ref 8–23)
CO2: 24 mmol/L (ref 22–32)
Calcium: 8 mg/dL — ABNORMAL LOW (ref 8.9–10.3)
Chloride: 94 mmol/L — ABNORMAL LOW (ref 98–111)
Creatinine, Ser: 0.82 mg/dL (ref 0.61–1.24)
GFR, Estimated: >60 mL/min (ref >60)
Glucose, Bld: 96 mg/dL (ref 70–99)
Potassium: 4.7 mmol/L (ref 3.5–5.1)
Sodium: 129 mmol/L — ABNORMAL LOW (ref 135–145)

## 2023-02-26 LAB — URINALYSIS, W/ REFLEX TO CULTURE (INFECTION SUSPECTED)
RBC / HPF: >50 RBC/hpf (ref 0–5)
WBC, UA: >50 WBC/hpf (ref 0–5)

## 2023-02-26 LAB — PROTIME-INR
INR: 1.9 — ABNORMAL HIGH (ref 0.8–1.2)
Prothrombin Time: 21.9 s — ABNORMAL HIGH (ref 11.4–15.2)

## 2023-02-26 LAB — PREPARE RBC (CROSSMATCH)

## 2023-02-26 MED ORDER — SODIUM CHLORIDE 0.9 % IV SOLN
INTRAVENOUS | Status: AC
  Filled 2023-02-26: qty 20

## 2023-02-26 MED ORDER — LIDOCAINE-EPINEPHRINE 1 %-1:100000 IJ SOLN
20.0000 mL | Freq: Once | INTRAMUSCULAR | Status: AC
  Administered 2023-02-26: 10 mL via INTRADERMAL

## 2023-02-26 MED ORDER — SODIUM CHLORIDE 0.9 % IV SOLN: INTRAVENOUS | Status: DC

## 2023-02-26 MED ORDER — THIAMINE MONONITRATE 100 MG PO TABS
100.0000 mg | ORAL_TABLET | Freq: Every day | ORAL | Status: DC
  Administered 2023-02-26 – 2023-02-27 (×2): 100 mg via ORAL
  Filled 2023-02-26 (×2): qty 1

## 2023-02-26 MED ORDER — FOLIC ACID 1 MG PO TABS
1.0000 mg | ORAL_TABLET | Freq: Every day | ORAL | Status: DC
  Administered 2023-02-26 – 2023-02-27 (×2): 1 mg via ORAL
  Filled 2023-02-26 (×2): qty 1

## 2023-02-26 MED ORDER — LORAZEPAM 1 MG PO TABS
1.0000 mg | ORAL_TABLET | ORAL | Status: DC | PRN
  Administered 2023-02-26 – 2023-02-27 (×3): 2 mg via ORAL
  Filled 2023-02-26 (×2): qty 2

## 2023-02-26 MED ORDER — FENTANYL CITRATE (PF) 100 MCG/2ML IJ SOLN
INTRAMUSCULAR | Status: AC | PRN
  Administered 2023-02-26 (×2): 50 ug via INTRAVENOUS

## 2023-02-26 MED ORDER — ACETAMINOPHEN 500 MG PO TABS
500.0000 mg | ORAL_TABLET | Freq: Four times a day (QID) | ORAL | Status: DC | PRN
  Administered 2023-02-26: 500 mg via ORAL
  Filled 2023-02-26: qty 1

## 2023-02-26 MED ORDER — LORAZEPAM 2 MG/ML IJ SOLN
1.0000 mg | INTRAMUSCULAR | Status: DC | PRN
  Administered 2023-02-27: 3 mg via INTRAVENOUS
  Filled 2023-02-26 (×3): qty 2

## 2023-02-26 MED ORDER — SORBITOL 70 % SOLN: 30.0000 mL | Freq: Every day | Status: DC | PRN

## 2023-02-26 MED ORDER — ADULT MULTIVITAMIN W/MINERALS CH
1.0000 | ORAL_TABLET | Freq: Every day | ORAL | Status: DC
  Administered 2023-02-26 – 2023-02-27 (×2): 1 via ORAL
  Filled 2023-02-26 (×2): qty 1

## 2023-02-26 MED ORDER — LIDOCAINE-EPINEPHRINE 1 %-1:100000 IJ SOLN
INTRAMUSCULAR | Status: AC
  Filled 2023-02-26: qty 1

## 2023-02-26 MED ORDER — THIAMINE HCL 100 MG/ML IJ SOLN: 100.0000 mg | Freq: Every day | INTRAMUSCULAR | Status: DC

## 2023-02-26 MED ORDER — ONDANSETRON HCL 4 MG/2ML IJ SOLN: 4.0000 mg | Freq: Four times a day (QID) | INTRAMUSCULAR | Status: DC | PRN

## 2023-02-26 MED ORDER — ALBUTEROL SULFATE (2.5 MG/3ML) 0.083% IN NEBU
2.5000 mg | INHALATION_SOLUTION | RESPIRATORY_TRACT | Status: DC | PRN
  Administered 2023-02-27: 2.5 mg via RESPIRATORY_TRACT

## 2023-02-26 MED ORDER — SODIUM CHLORIDE 0.9 % IV SOLN
2.0000 g | Freq: Once | INTRAVENOUS | Status: AC
  Administered 2023-02-26: 2 g via INTRAVENOUS

## 2023-02-26 MED ORDER — IOHEXOL 300 MG/ML  SOLN
50.0000 mL | Freq: Once | INTRAMUSCULAR | Status: AC | PRN
Start: 2023-02-26 — End: 2023-02-26
  Administered 2023-02-26: 20 mL

## 2023-02-26 MED ORDER — MIDAZOLAM HCL 2 MG/2ML IJ SOLN
INTRAMUSCULAR | Status: AC | PRN
  Administered 2023-02-26 (×4): 1 mg via INTRAVENOUS

## 2023-02-26 MED ORDER — SODIUM CHLORIDE 0.9% FLUSH: 5.0000 mL | Freq: Three times a day (TID) | INTRAVENOUS | Status: DC

## 2023-02-26 MED ORDER — FENTANYL CITRATE (PF) 100 MCG/2ML IJ SOLN
INTRAMUSCULAR | Status: AC
  Filled 2023-02-26: qty 2

## 2023-02-26 MED ORDER — SODIUM CHLORIDE 0.9% IV SOLUTION: Freq: Once | INTRAVENOUS | Status: AC

## 2023-02-26 MED ORDER — MIDAZOLAM HCL 2 MG/2ML IJ SOLN
INTRAMUSCULAR | Status: AC
  Filled 2023-02-26: qty 4

## 2023-02-26 MED ORDER — ACETAMINOPHEN 650 MG RE SUPP: 650.0000 mg | Freq: Four times a day (QID) | RECTAL | Status: DC | PRN

## 2023-02-26 MED ORDER — ONDANSETRON HCL 4 MG PO TABS: 4.0000 mg | ORAL_TABLET | Freq: Four times a day (QID) | ORAL | Status: DC | PRN

## 2023-02-26 NOTE — H&P (Addendum)
History and Physical    Patient: Noah Rosales QMV:[id number] DOB: Sep 18, 1960 DOA: 02/26/2023 DOS: the patient was seen and examined on 02/26/2023 PCP: Pcp, No  Patient coming from: Home  Chief Complaint:  Chief Complaint  Patient presents with   Low Hgb   HPI: Noah Rosales is a 62 y.o. male with medical history significant for CAD and stent and high-grade papillary urothelial carcinoma diagnosed 6 months ago.  He has a right urostomy tube in place.  The urostomy tube was pulled out by mistake approximately 4 days ago.  The patient is able to urinate despite losing the urostomy tube.  He has had significant hematuria with clots out of his urethra since the tube came out.  Today he had a scheduled appointment to have the urostomy tube replaced by IR.  He had the procedure and radiology ordered labs afterwards.  The labs revealed a hemoglobin of 6.3 so the patient was told to come to the emergency department.  The patient denies exertional shortness of breath or chest pain or or chills or feeling bad.  He states he was in his usual state of health up until the procedure today.  He does drink alcohol frequently his last drink was yesterday.  He denies any history of shakes or seizures or DTs. In the emergency department the patient's sheets are bloody with visible clots and he has blood around his penis and upper thigh.   Review of Systems: As mentioned in the history of present illness. All other systems reviewed and are negative. Past Medical History:  Diagnosis Date   Alcohol abuse    CAD (coronary artery disease)    Dyspnea    GERD (gastroesophageal reflux disease)    Myocardial infarction (HCC) 04/16/2003   s/p stent to right coronary artery   Past Surgical History:  Procedure Laterality Date   CYSTOSCOPY WITH RETROGRADE PYELOGRAM, URETEROSCOPY AND STENT PLACEMENT N/A 09/09/2022   Procedure: CYSTOSCOPY WITH CLOT EVACUATION, CYSTOGRAM CONVERTED TO OPEN BLADDER EXPLORATION;   Surgeon: Rene Paci, MD;  Location[facility] OR;  Service: Urology;  Laterality: N/A;   Exploration of mediastinum, extracorporeal circulation,  11/23/10   Dr Laneta Simmers   IR NEPHROSTOMY EXCHANGE RIGHT  11/11/2022   IR NEPHROSTOMY EXCHANGE RIGHT  01/06/2023   IR NEPHROSTOMY PLACEMENT RIGHT  09/10/2022   s/p cagb x 4  11/22/10   Dr Laneta Simmers   Social History:  reports that he has been smoking. He does not have any smokeless tobacco history on file. He reports current alcohol use. No history on file for drug use.  Allergies  Allergen Reactions   Sertraline Hcl Rash and Other (See Comments)    "Zoloft"    Family History  Problem Relation Age of Onset   Heart disease Other     Prior to Admission medications   Medication Sig Start Date End Date Taking? Authorizing Provider  oxyCODONE (OXY IR/ROXICODONE) 5 MG immediate release tablet Take 5-10 mg by mouth 2 (two) times daily as needed (for pain). 01/21/23  Yes [provider]  TYLENOL 500 MG tablet Take 500-1,000 mg by mouth every 6 (six) hours as needed for mild pain (pain score 1-3) or headache.   Yes [provider]  folic acid (FOLVITE) 1 MG tablet Take 1 tablet (1 mg total) by mouth daily. Patient not taking: Reported on 02/26/2023 09/14/22   Burnadette Pop, MD  polyethylene glycol powder (GLYCOLAX/MIRALAX) 17 GM/SCOOP powder Take 17 g by mouth daily. Patient not taking: Reported on  02/26/2023 09/14/22   Burnadette Pop, MD  potassium chloride SA (KLOR-CON M) 20 MEQ tablet Take 2 tablets (40 mEq total) by mouth daily for 5 days. Patient not taking: Reported on 02/26/2023 09/14/22 02/26/23  Burnadette Pop, MD  thiamine (VITAMIN B1) 100 MG tablet Take 1 tablet (100 mg total) by mouth daily. Patient not taking: Reported on 02/26/2023 09/14/22   Burnadette Pop, MD    Physical Exam: Vitals:   02/26/23 1747  BP: 117/60  Pulse: (!) 107  Resp: 16  Temp: 98.7 F (37.1 C)  TempSrc: Oral  SpO2: 94%   Physical  Exam:  General: No acute distress, pale, disheveled, chronically ill-appearing HEENT: Normocephalic, atraumatic, PERRL Cardiovascular: Normal rhythm.  Tachycardic, distal pulses not palpable Pulmonary: Normal pulmonary effort, mild expiratory wheezing in the bases Gastrointestinal: Distended abdomen,  non-tender, normoactive bowel sounds Musculoskeletal:Normal ROM, thick chronic lower ext edema Skin: Skin is thickened, multiple scratches and abrasions Neuro: Difficulty moving around in the bed but he does move all extremities purposefully AAOx3.  Wheelchair or walker use at baseline PSYCH: Attentive and cooperative  Data Reviewed:  Results for orders placed or performed during the hospital encounter of 02/26/23 (from the past 24 hour(s))  CBC with Differential/Platelet     Status: Abnormal   Collection Time: 02/26/23  2:06 PM  Result Value Ref Range   WBC 9.4 4.0 - 10.5 K/uL   RBC 3.35 (L) 4.22 - 5.81 MIL/uL   Hemoglobin 6.3 (LL) 13.0 - 17.0 g/dL   HCT 16.1 (L) 09.6 - 04.5 %   MCV 71.0 (L) 80.0 - 100.0 fL   MCH 18.8 (L) 26.0 - 34.0 pg   MCHC 26.5 (L) 30.0 - 36.0 g/dL   RDW 40.9 (H) 81.1 - 91.4 %   Platelets 277 150 - 400 K/uL   nRBC 0.2 0.0 - 0.2 %   Neutrophils Relative % 74 %   Neutro Abs 7.0 1.7 - 7.7 K/uL   Lymphocytes Relative 8 %   Lymphs Abs 0.8 0.7 - 4.0 K/uL   Monocytes Relative 16 %   Monocytes Absolute 1.5 (H) 0.1 - 1.0 K/uL   Eosinophils Relative 1 %   Eosinophils Absolute 0.1 0.0 - 0.5 K/uL   Basophils Relative 0 %   Basophils Absolute 0.0 0.0 - 0.1 K/uL   Immature Granulocytes 1 %   Abs Immature Granulocytes 0.05 0.00 - 0.07 K/uL  Basic metabolic panel     Status: Abnormal   Collection Time: 02/26/23  2:06 PM  Result Value Ref Range   Sodium 129 (L) 135 - 145 mmol/L   Potassium 4.7 3.5 - 5.1 mmol/L   Chloride 94 (L) 98 - 111 mmol/L   CO2 24 22 - 32 mmol/L   Glucose, Bld 96 70 - 99 mg/dL   BUN 14 8 - 23 mg/dL   Creatinine, Ser 7.82 0.61 - 1.24 mg/dL    Calcium 8.0 (L) 8.9 - 10.3 mg/dL   GFR, Estimated >95 >62 mL/min   Anion gap 11 5 - 15  Protime-INR     Status: Abnormal   Collection Time: 02/26/23  2:23 PM  Result Value Ref Range   Prothrombin Time 21.9 (H) 11.4 - 15.2 seconds   INR 1.9 (H) 0.8 - 1.2     Assessment and Plan: Post hemorrhagic anemia due to hematuria - transfuse and monitor.  He has urine in his urostomy bag but he also urinates by urethra regularly. Chronic hematuria and known urothelial cancer - chronic right urostomy tube -  patient tells me he was warned that there may be some blood in the urostomy bag for the next couple of days postprocedure. 3.  Alcohol abuse -CIWA protocol including thiamine, folate, and multivitamin daily 4. Possible UTI - the patient reports that his urologist was planning on starting an antibiotic.  There is a urine culture pending. Await results. 5. Wheezing - as needed albuterol nebs.   Advance Care Planning:   Code Status: Full Code the patient wants to be full code and names his friend Alexia Freestone as his Social research officer, government.  Consults: None  Family Communication: none  Severity of Illness: The appropriate patient status for this patient is INPATIENT. Inpatient status is judged to be reasonable and necessary in order to provide the required intensity of service to ensure the patient's safety. The patient's presenting symptoms, physical exam findings, and initial radiographic and laboratory data in the context of their chronic comorbidities is felt to place them at high risk for further clinical deterioration. Furthermore, it is not anticipated that the patient will be medically stable for discharge from the hospital within 2 midnights of admission.   * I certify that at the point of admission it is my clinical judgment that the patient will require inpatient hospital care spanning beyond 2 midnights from the point of admission due to high intensity of service, high risk for further  deterioration and high frequency of surveillance required.*  Author: Buena Irish, MD 02/26/2023 7:54 PM  For on call review [email or web address].

## 2023-02-26 NOTE — Procedures (Signed)
Pre Procedure Dx: Hydronephrosis d/t bladder cancer Post Procedural Dx: Same  Successful [place] and fluoroscopic guided placement of a right sided PCN with end coiled and locked within the renal pelvis. PCN connected to gravity bag.  EBL: None  Complications: None immediate.  Katherina Right, MD Pager #: [phone]

## 2023-02-26 NOTE — ED Notes (Signed)
7:12 PM Report received from previous RN. This RN assumes care of the patient.

## 2023-02-26 NOTE — ED Notes (Signed)
ED TO INPATIENT HANDOFF REPORT  ED Nurse Name and Phone #: Reatha Harps Name/Age/Gender Noah Rosales 62 y.o. male Room/Bed: WA14/WA14  Code Status   Code Status: Full Code  Home/SNF/Other Home Patient oriented to: self, person, place, situation Is this baseline? Yes   Triage Complete: Triage complete  Chief Complaint Acute post-hemorrhagic anemia [D62]  Triage Note Pt had r nephrostomy tube placed today, was found to have low hgb, and passing clots through penis   Allergies Allergies  Allergen Reactions   Sertraline Hcl Rash and Other (See Comments)    "Zoloft"    Level of Care/Admitting Diagnosis ED Disposition     ED Disposition  Admit   Condition  --   Comment  Hospital Area: [facility] [[id number]]  Level of Care: Med-Surg [16]  May admit patient to [facility] or [facility] if equivalent level of care is available:: No  Covid Evaluation: Asymptomatic - no recent exposure (last 10 days) testing not required  Diagnosis: Acute post-hemorrhagic anemia [161096]  Admitting Physician: Buena Irish [[id number]]  Attending Physician: Buena Irish [[id number]]  Certification:: I certify this patient will need inpatient services for at least 2 midnights  Expected Medical Readiness: 02/28/2023          B Medical/Surgery History Past Medical History:  Diagnosis Date   Alcohol abuse    CAD (coronary artery disease)    Dyspnea    GERD (gastroesophageal reflux disease)    Myocardial infarction (HCC) 04/16/2003   s/p stent to right coronary artery   Past Surgical History:  Procedure Laterality Date   CYSTOSCOPY WITH RETROGRADE PYELOGRAM, URETEROSCOPY AND STENT PLACEMENT N/A 09/09/2022   Procedure: CYSTOSCOPY WITH CLOT EVACUATION, CYSTOGRAM CONVERTED TO OPEN BLADDER EXPLORATION;  Surgeon: Rene Paci, MD;  Location: [facility] OR;  Service: Urology;  Laterality: N/A;   Exploration of mediastinum, extracorporeal circulation,  11/23/10   Dr  Laneta Simmers   IR NEPHROSTOMY EXCHANGE RIGHT  11/11/2022   IR NEPHROSTOMY EXCHANGE RIGHT  01/06/2023   IR NEPHROSTOMY PLACEMENT RIGHT  09/10/2022   s/p cagb x 4  11/22/10   Dr Laneta Simmers     A IV Location/Drains/Wounds Patient Lines/Drains/Airways Status     Active Line/Drains/Airways     Name Placement date Placement time Site Days   Peripheral IV 02/26/23 20 G Anterior;Left Forearm 02/26/23  1800  Forearm  less than 1   Nephrostomy Right 10 Fr. 02/26/23  1544  Right  less than 1   Wound / Incision (Open or Dehisced) 11/08/21 Irritant Dermatitis (Moisture Associated Skin Damage) Pelvis Anterior;Left;Right Groin 11/08/21  0333  Pelvis  475            Intake/Output Last 24 hours  Intake/Output Summary (Last 24 hours) at 02/26/2023 2033 Last data filed at 02/26/2023 1920 Gross per 24 hour  Intake --  Output 800 ml  Net -800 ml    Labs/Imaging Results for orders placed or performed during the hospital encounter of 02/26/23 (from the past 48 hour(s))  Type and screen [facility]     Status: None (Preliminary result)   Collection Time: 02/26/23  5:58 PM  Result Value Ref Range   ABO/RH(D) PENDING    Antibody Screen PENDING    Sample Expiration      03/01/2023,2359 Performed at [facility], [address], [place], [place] [address]    No results found.  Pending Labs [employer] (From admission, onward)     Start  Ordered   02/27/23 0500  TSH  Tomorrow morning,   R        02/26/23 1951   02/27/23 0500  Basic metabolic panel  Tomorrow morning,   R        02/26/23 1951   02/27/23 0500  CBC  Tomorrow morning,   R        02/26/23 1951   02/26/23 1949  HIV Antibody (routine testing w rflx)  (HIV Antibody (Routine testing w reflex) panel)  Once,   R        02/26/23 1951   02/26/23 1833  Urinalysis, w/ Reflex to Culture (Infection Suspected) -Urine, Clean Catch  Once,   URGENT       Question:  Specimen Source  Answer:  Urine, Clean Catch    02/26/23 1832   02/26/23 1832  Prepare RBC (crossmatch)  (Blood Administration Adult)  Once,   R       Question Answer Comment  # of Units 2 units   Transfusion Indications Hemoglobin < 7 gm/dL and symptomatic   Transfusion Indications Hemoglobin 8 gm/dL or less and orthopedic or cardiac surgery or pre-existing cardiac condition   Number of Units to Keep Ahead NO units ahead   If emergent release call blood bank Not emergent release      02/26/23 1832            Vitals/Pain Today's Vitals   02/26/23 1747 02/26/23 2018  BP: 117/60 117/65  Pulse: (!) 107 (!) 108  Resp: 16 20  Temp: 98.7 F (37.1 C) 99.6 F (37.6 C)  TempSrc: Oral Oral  SpO2: 94% 93%    Isolation Precautions No active isolations  Medications Medications  0.9 %  sodium chloride infusion (Manually program via Guardrails IV Fluids) (has no administration in time range)  LORazepam (ATIVAN) tablet 1-4 mg (has no administration in time range)    Or  LORazepam (ATIVAN) injection 1-4 mg (has no administration in time range)  thiamine (VITAMIN B1) tablet 100 mg (100 mg Oral Given 02/26/23 2012)    Or  thiamine (VITAMIN B1) injection 100 mg ( Intravenous See Alternative 02/26/23 2012)  folic acid (FOLVITE) tablet 1 mg (1 mg Oral Given 02/26/23 2012)  multivitamin with minerals tablet 1 tablet (1 tablet Oral Given 02/26/23 2012)  acetaminophen (TYLENOL) tablet 500 mg (has no administration in time range)    Or  acetaminophen (TYLENOL) suppository 650 mg (has no administration in time range)  ondansetron (ZOFRAN) tablet 4 mg (has no administration in time range)    Or  ondansetron (ZOFRAN) injection 4 mg (has no administration in time range)  sorbitol 70 % solution 30 mL (has no administration in time range)  albuterol (PROVENTIL) (2.5 MG/3ML) 0.083% nebulizer solution 2.5 mg (has no administration in time range)    Mobility walks with device (walker)     Focused Assessments Cardiac Assessment  Handoff:    Lab Results  Component Value Date   CKTOTAL 478 (H) 10/09/2010   CKMB 8.4 (HH) 10/09/2010   TROPONINI <0.30 10/09/2010   No results found for: "DDIMER" Does the Patient currently have chest pain? No    R Recommendations: See Admitting Provider Note  Report given to:   Additional Notes:

## 2023-02-26 NOTE — ED Triage Notes (Signed)
Pt had r nephrostomy tube placed today, was found to have low hgb, and passing clots through penis

## 2023-02-26 NOTE — ED Provider Notes (Signed)
[place] EMERGENCY DEPARTMENT AT [facility] Provider Note   CSN: [id number] Arrival date & time: 02/26/23  1744     History  Chief Complaint  Patient presents with   Low Hgb    Noah Rosales is a 62 y.o. male.  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  Patient states that his right nephrostomy tube recently fell out and he went to IR today to have the tube replaced.  He states while he was there he was told that his hemoglobin was low and recommended that he come to the emergency department.  The patient states that he has had hematuria for several months and it has been slightly increased over the last week.  He states that he is passing blood clots.  He states that he does still feel like he can completely empty his bladder.  He states that he thought that his doctor was going to start him on antibiotics.  He denies any recent fevers.  He states that he has been feeling mildly short of breath and weak all over.  He denies any lightheadedness or dizziness.  The history is provided by the patient.       Home Medications Prior to Admission medications   Medication Sig Start Date End Date Taking? Authorizing Provider  folic acid (FOLVITE) 1 MG tablet Take 1 tablet (1 mg total) by mouth daily. 09/14/22   Burnadette Pop, MD  polyethylene glycol powder (GLYCOLAX/MIRALAX) 17 GM/SCOOP powder Take 17 g by mouth daily. 09/14/22   Burnadette Pop, MD  potassium chloride SA (KLOR-CON M) 20 MEQ tablet Take 2 tablets (40 mEq total) by mouth daily for 5 days. 09/14/22 09/19/22  Burnadette Pop, MD  thiamine (VITAMIN B1) 100 MG tablet Take 1 tablet (100 mg total) by mouth daily. 09/14/22   Burnadette Pop, MD      Allergies    Sertraline hcl    Review of  Systems   Review of Systems  Physical Exam Updated Vital Signs BP 117/60   Pulse (!) 107   Temp 98.7 F (37.1 C) (Oral)   Resp 16   SpO2 94%  Physical Exam Vitals and nursing note reviewed.  Constitutional:      General: He is not in acute distress.    Appearance: Normal appearance.  HENT:     Head: Normocephalic and atraumatic.     Nose: Nose normal.     Mouth/Throat:     Mouth: Mucous membranes are moist.     Pharynx: Oropharynx is clear.  Eyes:     Extraocular Movements: Extraocular movements intact.     Conjunctiva/sclera: Conjunctivae normal.  Cardiovascular:     Rate and Rhythm: Regular rhythm. Tachycardia present.     Heart sounds: Normal heart sounds.  Pulmonary:     Effort: Pulmonary effort is normal.     Breath sounds: Normal breath sounds.  Abdominal:     General: Abdomen is flat.     Palpations: Abdomen is soft.     Tenderness: There is no abdominal tenderness.  Genitourinary:    Comments: Gross hematuria in urine, passing blood clots Musculoskeletal:        General: Normal range of motion.     Cervical back: Normal range of motion.  Skin:    General: Skin is warm and dry.  Comments: Right nephrostomy tube in place, pink urine in nephrostomy bag  Neurological:     General: No focal deficit present.     Mental Status: He is alert and oriented to person, place, and time.  Psychiatric:        Mood and Affect: Mood normal.        Behavior: Behavior normal.     ED Results / Procedures / Treatments   Labs (all labs ordered are listed, but only abnormal results are displayed) Labs Reviewed  URINALYSIS, W/ REFLEX TO CULTURE (INFECTION SUSPECTED)  TYPE AND SCREEN  PREPARE RBC (CROSSMATCH)    EKG None  Radiology No results found.  Procedures .Critical Care  Performed by: Rexford Maus, DO Authorized by: Rexford Maus, DO   Critical care provider statement:    Critical care time (minutes):  35   Critical care time was  exclusive of:  Separately billable procedures and treating other patients   Critical care was necessary to treat or prevent imminent or life-threatening deterioration of the following conditions:  Circulatory failure   Critical care was time spent personally by me on the following activities:  Development of treatment plan with patient or surrogate, discussions with consultants, evaluation of patient's response to treatment, examination of patient, obtaining history from patient or surrogate, ordering and performing treatments and interventions, ordering and review of laboratory studies, ordering and review of radiographic studies, pulse oximetry, re-evaluation of patient's condition and review of old charts   I assumed direction of critical care for this patient from another provider in my specialty: no     Care discussed with: admitting provider       Medications Ordered in ED Medications  0.9 %  sodium chloride infusion (Manually program via Guardrails IV Fluids) (has no administration in time range)  LORazepam (ATIVAN) tablet 1-4 mg (has no administration in time range)    Or  LORazepam (ATIVAN) injection 1-4 mg (has no administration in time range)  thiamine (VITAMIN B1) tablet 100 mg (has no administration in time range)    Or  thiamine (VITAMIN B1) injection 100 mg (has no administration in time range)  folic acid (FOLVITE) tablet 1 mg (has no administration in time range)  multivitamin with minerals tablet 1 tablet (has no administration in time range)    ED Course/ Medical Decision Making/ A&P                                 Medical Decision Making This patient presents to the ED with chief complaint(s) of abnormal labs with pertinent past medical history of CAD, ureteral cancer w/ R nephrostomy in place, alcohol abuse which further complicates the presenting complaint. The complaint involves an extensive differential diagnosis and also carries with it a high risk of complications  and morbidity.    The differential diagnosis includes anemia, coagulopathy, UTI, malignancy   Additional history obtained: Additional history obtained from N/A Records reviewed outpatient IR records, recent admission records  ED Course and Reassessment: Patient's arrival he is mildly tachycardic otherwise hemodynamically stable in no acute distress, he is urinating gross blood in the urine.  Patient had labs performed this afternoon that did show a hemoglobin of 6.3 from his baseline around 8.  With his history of CAD, will order 2 units of PRBCs.  He had mildly elevated INR.  The patient will have UA performed to evaluate for possible infection.  The patient  will be placed on CIWA protocol for his alcohol use, no current signs of withdrawal on exam.  Patient will require admission for symptomatic anemia.  Independent labs interpretation:  The following labs were independently interpreted: Hemoglobin 6.3, mildly elevated INR  Independent visualization of imaging: - N/A  Consultation: - Consulted or discussed management/test interpretation w/ external professional: hospitalist  Consideration for admission or further workup: patient requires admission for symptomatic anemia Social Determinants of health: N/A    Amount and/or Complexity of Data Reviewed Labs: ordered.  Risk Prescription drug management.          Final Clinical Impression(s) / ED Diagnoses Final diagnoses:  Symptomatic anemia  Gross hematuria    Rx / DC Orders ED Discharge Orders     None         Rexford Maus, DO 02/26/23 1905

## 2023-02-27 DIAGNOSIS — C679 Malignant neoplasm of bladder, unspecified: Secondary | ICD-10-CM

## 2023-02-27 DIAGNOSIS — D62 Acute posthemorrhagic anemia: Secondary | ICD-10-CM | POA: Diagnosis not present

## 2023-02-27 DIAGNOSIS — L8990 Pressure ulcer of unspecified site, unspecified stage: Secondary | ICD-10-CM | POA: Insufficient documentation

## 2023-02-27 DIAGNOSIS — G9340 Encephalopathy, unspecified: Secondary | ICD-10-CM

## 2023-02-27 LAB — PROTIME-INR
INR: 1.2 (ref 0.8–1.2)
Prothrombin Time: 15.4 s — ABNORMAL HIGH (ref 11.4–15.2)

## 2023-02-27 LAB — MRSA NEXT GEN BY PCR, NASAL: MRSA by PCR Next Gen: DETECTED — AB

## 2023-02-27 LAB — TSH: TSH: 0.648 u[IU]/mL (ref 0.350–4.500)

## 2023-02-27 LAB — BASIC METABOLIC PANEL
Anion gap: 8 (ref 5–15)
BUN: 13 mg/dL (ref 8–23)
CO2: 28 mmol/L (ref 22–32)
Calcium: 7.9 mg/dL — ABNORMAL LOW (ref 8.9–10.3)
Chloride: 94 mmol/L — ABNORMAL LOW (ref 98–111)
Creatinine, Ser: 0.84 mg/dL (ref 0.61–1.24)
GFR, Estimated: >60 mL/min (ref >60)
Glucose, Bld: 134 mg/dL — ABNORMAL HIGH (ref 70–99)
Potassium: 3.6 mmol/L (ref 3.5–5.1)
Sodium: 130 mmol/L — ABNORMAL LOW (ref 135–145)

## 2023-02-27 LAB — GLUCOSE, CAPILLARY: Glucose-Capillary: 128 mg/dL — ABNORMAL HIGH (ref 70–99)

## 2023-02-27 LAB — CBC
HCT: 25.2 % — ABNORMAL LOW (ref 39.0–52.0)
Hemoglobin: 7.3 g/dL — ABNORMAL LOW (ref 13.0–17.0)
MCH: 21.4 pg — ABNORMAL LOW (ref 26.0–34.0)
MCHC: 29 g/dL — ABNORMAL LOW (ref 30.0–36.0)
MCV: 73.9 fL — ABNORMAL LOW (ref 80.0–100.0)
Platelets: 250 10*3/uL (ref 150–400)
RBC: 3.41 MIL/uL — ABNORMAL LOW (ref 4.22–5.81)
RDW: 20.4 % — ABNORMAL HIGH (ref 11.5–15.5)
WBC: 11.4 10*3/uL — ABNORMAL HIGH (ref 4.0–10.5)
nRBC: 0.3 % — ABNORMAL HIGH (ref 0.0–0.2)

## 2023-02-27 LAB — PREPARE RBC (CROSSMATCH)

## 2023-02-27 LAB — HIV ANTIBODY (ROUTINE TESTING W REFLEX): HIV Screen 4th Generation wRfx: NONREACTIVE

## 2023-02-27 MED ORDER — SODIUM CHLORIDE 0.9 % IV SOLN
2.0000 g | INTRAVENOUS | Status: DC
  Administered 2023-02-27 – 2023-03-01 (×3): 2 g via INTRAVENOUS
  Filled 2023-02-27 (×3): qty 20

## 2023-02-27 MED ORDER — IPRATROPIUM-ALBUTEROL 0.5-2.5 (3) MG/3ML IN SOLN
3.0000 mL | Freq: Two times a day (BID) | RESPIRATORY_TRACT | Status: DC
  Administered 2023-02-27 – 2023-02-28 (×2): 3 mL via RESPIRATORY_TRACT
  Filled 2023-02-27 (×2): qty 3

## 2023-02-27 MED ORDER — ACETAMINOPHEN 650 MG RE SUPP: 650.0000 mg | Freq: Four times a day (QID) | RECTAL | Status: DC | PRN

## 2023-02-27 MED ORDER — MORPHINE SULFATE (PF) 2 MG/ML IV SOLN
1.0000 mg | INTRAVENOUS | Status: DC | PRN
  Administered 2023-02-27: 2 mg via INTRAVENOUS
  Filled 2023-02-27: qty 1

## 2023-02-27 MED ORDER — ORAL CARE MOUTH RINSE: 15.0000 mL | OROMUCOSAL | Status: DC | PRN

## 2023-02-27 MED ORDER — SODIUM CHLORIDE 0.9% IV SOLUTION: Freq: Once | INTRAVENOUS | Status: DC

## 2023-02-27 MED ORDER — GLYCOPYRROLATE 1 MG PO TABS: 1.0000 mg | ORAL_TABLET | ORAL | Status: DC | PRN

## 2023-02-27 MED ORDER — MORPHINE SULFATE (PF) 2 MG/ML IV SOLN
2.0000 mg | INTRAVENOUS | Status: DC | PRN
Start: 2023-02-27 — End: 2023-03-02
  Administered 2023-02-27 (×2): 4 mg via INTRAVENOUS
  Administered 2023-02-28: 2 mg via INTRAVENOUS
  Administered 2023-02-28 (×2): 4 mg via INTRAVENOUS
  Administered 2023-02-28: 2 mg via INTRAVENOUS
  Administered 2023-02-28: 4 mg via INTRAVENOUS
  Administered 2023-03-01: 2 mg via INTRAVENOUS
  Filled 2023-02-27 (×2): qty 1
  Filled 2023-02-27 (×4): qty 2
  Filled 2023-02-27: qty 1
  Filled 2023-02-27: qty 2

## 2023-02-27 MED ORDER — GLYCOPYRROLATE 0.2 MG/ML IJ SOLN: 0.2000 mg | INTRAMUSCULAR | Status: DC | PRN

## 2023-02-27 MED ORDER — OXYBUTYNIN CHLORIDE 5 MG PO TABS
5.0000 mg | ORAL_TABLET | Freq: Three times a day (TID) | ORAL | Status: DC
  Administered 2023-02-27 – 2023-03-02 (×8): 5 mg via ORAL
  Filled 2023-02-27 (×9): qty 1

## 2023-02-27 MED ORDER — OXYCODONE HCL 5 MG PO TABS
5.0000 mg | ORAL_TABLET | Freq: Four times a day (QID) | ORAL | Status: DC | PRN
  Administered 2023-02-28 (×2): 5 mg via ORAL
  Filled 2023-02-27 (×2): qty 1

## 2023-02-27 MED ORDER — IPRATROPIUM-ALBUTEROL 0.5-2.5 (3) MG/3ML IN SOLN
3.0000 mL | Freq: Four times a day (QID) | RESPIRATORY_TRACT | Status: DC
  Administered 2023-02-27: 3 mL via RESPIRATORY_TRACT
  Filled 2023-02-27: qty 3

## 2023-02-27 MED ORDER — BACLOFEN 10 MG PO TABS
5.0000 mg | ORAL_TABLET | Freq: Three times a day (TID) | ORAL | Status: DC | PRN
  Administered 2023-02-28: 5 mg via ORAL
  Filled 2023-02-27: qty 1

## 2023-02-27 MED ORDER — DIPHENHYDRAMINE HCL 50 MG/ML IJ SOLN: 25.0000 mg | INTRAMUSCULAR | Status: DC | PRN

## 2023-02-27 MED ORDER — MIDAZOLAM HCL 2 MG/2ML IJ SOLN
2.0000 mg | INTRAMUSCULAR | Status: DC | PRN
  Administered 2023-02-28 (×3): 2 mg via INTRAVENOUS
  Administered 2023-02-28: 4 mg via INTRAVENOUS
  Filled 2023-02-27 (×2): qty 2
  Filled 2023-02-27 (×2): qty 4

## 2023-02-27 MED ORDER — CHLORHEXIDINE GLUCONATE CLOTH 2 % EX PADS
6.0000 | MEDICATED_PAD | Freq: Every day | CUTANEOUS | Status: DC
  Administered 2023-02-27 – 2023-03-02 (×4): 6 via TOPICAL

## 2023-02-27 MED ORDER — MORPHINE SULFATE (PF) 2 MG/ML IV SOLN
1.0000 mg | INTRAVENOUS | Status: DC | PRN
  Administered 2023-02-27: 1 mg via INTRAVENOUS
  Filled 2023-02-27: qty 1

## 2023-02-27 MED ORDER — MUPIROCIN 2 % EX OINT
1.0000 | TOPICAL_OINTMENT | Freq: Two times a day (BID) | CUTANEOUS | Status: DC
  Administered 2023-02-28 – 2023-03-01 (×4): 1 via NASAL
  Filled 2023-02-27: qty 22

## 2023-02-27 MED ORDER — SODIUM CHLORIDE 0.9 % IV SOLN: INTRAVENOUS | Status: DC

## 2023-02-27 MED ORDER — POLYVINYL ALCOHOL 1.4 % OP SOLN: 1.0000 [drp] | Freq: Four times a day (QID) | OPHTHALMIC | Status: DC | PRN

## 2023-02-27 MED ORDER — ACETAMINOPHEN 325 MG PO TABS
650.0000 mg | ORAL_TABLET | Freq: Four times a day (QID) | ORAL | Status: DC | PRN
  Administered 2023-02-28: 650 mg via ORAL
  Filled 2023-02-27: qty 2

## 2023-02-27 MED ORDER — MORPHINE SULFATE (PF) 2 MG/ML IV SOLN: 2.0000 mg | INTRAVENOUS | Status: DC | PRN

## 2023-02-27 NOTE — IPAL (Addendum)
  Interdisciplinary Goals of Care Family Meeting   Date carried out: 02/27/2023  Location of the meeting: Bedside  Member's involved: Nurse Practitioner and Family Member or next of kin  Durable Power of Attorney or acting medical decision maker: common law wife has deferred decision making to brother    Discussion: We discussed goals of care for Noah Rosales .   Noah Rosales and his wife Noah Rosales are at bedside -- we talked about [name]'s illness course as well as what [name] values and what he has shared with Noah Rosales previously regarding healthcare wishes. [name]  would not want aggressive measures, or invasive measures and would elect for symptom management. Would not want surgery and was emphatic to brother about this.  He would not want resuscitative efforts.  With this, decision reached to pursue comfort focused care with goal of home hospice vs residential hospice pending course here.   Talked w uro -- cont CBI and will figure out best course of action for this tomorrow.  Dc labs, no transfusion, liberalize visitation, and optimize sx management  DNR  Code status: DNR  Disposition: Home with Hospice  Time spent for the meeting:    Lanier Clam, NP  02/27/2023, 4:06 PM

## 2023-02-27 NOTE — Procedures (Signed)
Urology was called back to bedside due to ongoing pain and catheter not draining.  Patient has been experiencing some sort of unidentified neurologic phenomenon where he shakes violently as of being electrocuted.  He has been bearing down very aggressively.  This pushed the balloon into his prostate, stopping draining and worsening his blood loss.  I struggled for some time to get the balloon to deflate under pressure.  After I was able to do this I again advanced the catheter into the bladder without difficulty but was not able to consistently irrigate.  At this point the catheter was removed and I elected to proceed with bedside cystoscopy.  Patient was prepped and draped in the usual sterile fashion.  The cystoscope was advanced into the bladder without much difficulty.  Visibility was extremely limited due to ongoing bleeding and lack of an external screen.  I believe I could see what was probably cancer advancement towards the bladder neck.  A sensor wire was loaded through the scope into the bladder and scope was offloaded.  I then used a 15 blade to cut the end off of a 59f three-way catheter, fashion to get into a council tip catheter.  This was well-lubricated and advanced into the bladder without resistance.  There was an immediate return of clear red urine and irrigant.  Balloon was inflated to 30 cc with sterile water and catheter was put to gravity drainage.  CBI drained freely.  I monitored this for another 5 to 10 minutes until irrigant cleared.  Case and plan reviewed with Dr. Cardell Peach.  I did not identify any obvious perforation and considering the volume of fluid present once catheter was placed, this is very unlikely.  Will forego cystoscopy and clot fulguration for the time being and reassess CBI in the morning.  Please call with questions.

## 2023-02-27 NOTE — Consult Note (Addendum)
Urology Consult Note   Requesting Attending Physician:  Alba Cory, MD Service Providing Consult: Urology  Consulting Attending: Dr. Cardell Peach   Reason for Consult:  Hematuria  HPI: Noah Rosales is seen in consultation for reasons noted above at the request of [name], [name], MD. patient is a 61 year old male known to our practice.  Her on 09/09/2022 for gross hematuria and clot retention ultimately resulting in cystoscopy turned open for intraperitoneal bladder perforation.  Extensive high-grade bladder tumor was found covering the right aspect of the bladder and the ureteral orifice.  He has had a right side percutaneous nephrostomy tube since then.  Per report, he fell playing pool and ripped out the percutaneous nephrostomy tube.  He did not present to the hospital for this for several days.  I was contacted due to report of bleeding from the urethra.  I came in to find patient in significant urinary obstruction.  I was not able to pass a catheter due to a mid to distal ureteral stricture.  Urethra was dilated and 79f hematuria catheter was placed.  There was an immediate return of between 7 and 800 cc of frankly bloody urine.  Some gelatinous fluid which may represent surgical lubricant, as well as some pieces that appeared to be tissue were noted in the hand irrigation fluid.  Patient has followed up in office and declined aggressive treatment within the last month, instead electing to proceed with serial scans and palliative care.  ------------------  Assessment:  62 y.o. male with significant hematuria, right side PCN T, right side high-grade urothelial carcinoma.   Recommendations: #Hematuria # High-grade muscle invasive urothelial carcinoma Patient has declined aggressive treatment per his last note with Dr. Berneice Heinrich.  He has elected to proceed with intermittent scans and palliation.  Foley catheter placed and CBI initiated.  Around 7-800 cc of bloody urine has returned.   Struggling to keep catheter draining or CBI going without clear etiology.  Discussed with Dr. Cardell Peach and will proceed with bedside cystoscopy if patient can tolerate.  Otherwise will plan to go back to the OR for clot evacuation, fulguration, and possible bladder repair again if necessary.    I have discussed the case with his girlfriend of 15 years [name] and his brother.  Since he is undergoing alcohol withdrawal and requiring significant amounts of sedation he cannot speak for himself at this time.  He is still full code but clarification needs to be made about his ultimate goals.  If he is not seeking curative treatment then this will continue to happen.  He is also confirmed to drink an upwards of 24 beers a day and consume shots of alcohol on top of that at times.  He is not a surgical candidate with that severe level of alcoholism.  Brother and long-term girlfriend are presenting to the hospital.  We are scheduled to have another conversation later today where the decision will be made.  Case and plan discussed with Dr. Cardell Peach  Past Medical History: Past Medical History:  Diagnosis Date   Alcohol abuse    CAD (coronary artery disease)    Dyspnea    GERD (gastroesophageal reflux disease)    Myocardial infarction (HCC) 04/16/2003   s/p stent to right coronary artery    Past Surgical History:  Past Surgical History:  Procedure Laterality Date   CYSTOSCOPY WITH RETROGRADE PYELOGRAM, URETEROSCOPY AND STENT PLACEMENT N/A 09/09/2022   Procedure: CYSTOSCOPY WITH CLOT EVACUATION, CYSTOGRAM CONVERTED TO OPEN BLADDER EXPLORATION;  Surgeon: Rene Paci, MD;  Location: [facility] OR;  Service: Urology;  Laterality: N/A;   Exploration of mediastinum, extracorporeal circulation,  11/23/10   Dr Laneta Simmers   IR NEPHROSTOMY EXCHANGE RIGHT  11/11/2022   IR NEPHROSTOMY EXCHANGE RIGHT  01/06/2023   IR NEPHROSTOMY PLACEMENT RIGHT  09/10/2022   IR NEPHROSTOMY PLACEMENT RIGHT  02/26/2023   s/p cagb x 4  11/22/10    Dr Laneta Simmers    Medication: Current Facility-Administered Medications  Medication Dose Route Frequency Provider Last Rate Last Admin   0.9 %  sodium chloride infusion   Intravenous Continuous [name], Belkys A, MD 75 mL/hr at 02/27/23 1116 New Bag at 02/27/23 1116   acetaminophen (TYLENOL) tablet 500 mg  500 mg Oral Q6H PRN Buena Irish, MD   500 mg at 02/26/23 2217   Or   acetaminophen (TYLENOL) suppository 650 mg  650 mg Rectal Q6H PRN Buena Irish, MD       albuterol (PROVENTIL) (2.5 MG/3ML) 0.083% nebulizer solution 2.5 mg  2.5 mg Nebulization Q4H PRN Buena Irish, MD   2.5 mg at 02/27/23 1115   cefTRIAXone (ROCEPHIN) 2 g in sodium chloride 0.9 % 100 mL IVPB  2 g Intravenous Q24H [name], Belkys A, MD 200 mL/hr at 02/27/23 1120 2 g at 02/27/23 1120   [START ON 02/28/2023] Chlorhexidine Gluconate Cloth 2 % PADS 6 each  6 each Topical Q0600 Wyline Mood, NP       folic acid (FOLVITE) tablet 1 mg  1 mg Oral Daily Kingsley, Victoria K, DO   1 mg at 02/27/23 0953   ipratropium-albuterol (DUONEB) 0.5-2.5 (3) MG/3ML nebulizer solution 3 mL  3 mL Nebulization BID [name], Belkys A, MD       LORazepam (ATIVAN) tablet 1-4 mg  1-4 mg Oral Q1H PRN Elayne Snare K, DO   2 mg at 02/27/23 0865   Or   LORazepam (ATIVAN) injection 1-4 mg  1-4 mg Intravenous Q1H PRN Elayne Snare K, DO   3 mg at 02/27/23 1109   morphine (PF) 2 MG/ML injection 1-2 mg  1-2 mg Intravenous Q4H PRN [name], Belkys A, MD       multivitamin with minerals tablet 1 tablet  1 tablet Oral Daily Theresia Lo, Victoria K, DO   1 tablet at 02/27/23 0953   ondansetron (ZOFRAN) tablet 4 mg  4 mg Oral Q6H PRN Buena Irish, MD       Or   ondansetron (ZOFRAN) injection 4 mg  4 mg Intravenous Q6H PRN Buena Irish, MD       oxybutynin (DITROPAN) tablet 5 mg  5 mg Oral TID [name], Belkys A, MD       oxyCODONE (Oxy IR/ROXICODONE) immediate release tablet 5 mg  5 mg Oral Q6H PRN [name], Belkys A,  MD       sorbitol 70 % solution 30 mL  30 mL Oral Daily PRN Buena Irish, MD       thiamine (VITAMIN B1) tablet 100 mg  100 mg Oral Daily Kingsley, Victoria K, DO   100 mg at 02/27/23 7846   Or   thiamine (VITAMIN B1) injection 100 mg  100 mg Intravenous Daily Elayne Snare K, DO        Allergies: Allergies  Allergen Reactions   Sertraline Hcl Rash and Other (See Comments)    "Zoloft"    Social History: Social History   Tobacco Use   Smoking status: Every Day    Types: Pipe  Vaping Use   Vaping  status: Never Used  Substance Use Topics   Alcohol use: Yes    Alcohol/week: 56.0 standard drinks of alcohol    Types: 56 Cans of beer per week    Comment: drinks 8 beers every night   Drug use: Not Currently    Family History Family History  Problem Relation Age of Onset   Heart disease Other     ROS   Objective   Vital signs in last 24 hours: BP (!) 149/73 (BP Location: Right Arm)   Pulse (!) 110   Temp 98.4 F (36.9 C) (Oral)   Resp (!) 22   SpO2 96%   Physical Exam General: Uncomfortable and undergoing alcohol withdrawal.  Unable to assess his mental clarity. HEENT: [place]/AT Pulmonary: Normal work of breathing Cardiovascular: RRR, no cyanosis Abdomen: Soft, NTTP, nondistended GU: 37f 3 way foley in place, CBI off, frequently obstructing Neuro: Sedated, CIWA protocol.  Most Recent Labs: Lab Results  Component Value Date   WBC 9.4 02/26/2023   HGB 6.3 (LL) 02/26/2023   HCT 23.8 (L) 02/26/2023   PLT 277 02/26/2023    Lab Results  Component Value Date   NA 130 (L) 02/27/2023   K 3.6 02/27/2023   CL 94 (L) 02/27/2023   CO2 28 02/27/2023   BUN 13 02/27/2023   CREATININE 0.84 02/27/2023   CALCIUM 7.9 (L) 02/27/2023   MG 2.1 09/11/2022   PHOS 2.9 09/10/2022    Lab Results  Component Value Date   INR 1.2 02/27/2023   APTT 36 11/22/2010     Urine Culture: @[id number] (laburin,org,[id number],[id number])@   IMAGING: IR NEPHROSTOMY PLACEMENT  RIGHT  Result Date: 02/27/2023 INDICATION: History of bladder cancer, post image guided placement of right sided nephrostomy catheter on 09/10/2022 with subsequent exchanges on 11/11/2022 and 01/06/2023. The patient's nephrostomy catheter was inadvertently removed at the end of last week. As such the patient presents today for image guided placement of a new nephrostomy catheter for urinary diversion purposes following acquisition of noncontrast CT scan performed earlier same day demonstrating recurrence of right-sided moderate ureterectasis and pelvicaliectasis. EXAM: ULTRASOUND AND FLUOROSCOPIC GUIDED PLACEMENT OF RIGHT NEPHROSTOMY TUBE COMPARISON:  CT abdomen pelvis-earlier same day Fluoroscopic guided exchange of right-sided nephrostomy catheter-01/06/2023; 11/11/2022 Image guided placement of right-sided nephrostomy catheter-09/10/2022 MEDICATIONS: Ciprofloxacin 400 mg IV; The antibiotic was administered in an appropriate time frame prior to skin puncture. ANESTHESIA/SEDATION: Moderate (conscious) sedation was employed during this procedure. A total of Versed 4 mg and Fentanyl 100 mcg was administered intravenously. Moderate Sedation Time: 15 minutes. The patient's level of consciousness and vital signs were monitored continuously by radiology nursing throughout the procedure under my direct supervision. CONTRAST:  10 mL Isovue 300 - administered into the renal collecting system FLUOROSCOPY TIME:  1 minute, 30 seconds (38 mGy) COMPLICATIONS: None immediate. PROCEDURE: The procedure, risks, benefits, and alternatives were explained to the patient, questions were encouraged and answered and informed consent was obtained. A timeout was performed prior to the initiation of the procedure. The operative site was prepped and draped in the usual sterile fashion and a sterile drape was applied covering the operative field. A sterile gown and sterile gloves were used for the procedure. Local anesthesia was provided  with 1% Lidocaine with epinephrine. Attempts were initially made to recannulate the prior nephrostomy catheter track however this proved unsuccessful secondary to complete track healing. As such, ultrasound was used to localize the right kidney. Under direct ultrasound guidance, a 20 gauge needle was advanced into the renal collecting system.  An ultrasound image documentation was performed. Access within the collecting system was confirmed with the efflux of urine followed by limited contrast injection. Under intermittent fluoroscopic guidance, an 0.018 wire was advanced into the collecting system and the tract was dilated with an Accustick stent. Next, over a short Amplatz wire, the track was further dilated ultimately allowing placement of a 10-French percutaneous nephrostomy catheter with end coiled and locked within the renal pelvis. Contrast was injected and several spot fluoroscopic images were obtained in various obliquities. The catheter was secured at the skin entrance site with an interrupted suture and a stat lock device and connected to a gravity bag. Dressings were applied. The patient tolerated procedure well without immediate postprocedural complication. FINDINGS: Complete healing of prior right-sided nephrostomy catheter track. Ultrasound scanning demonstrates a moderately right collecting system. Under a combination of ultrasound and fluoroscopic guidance, a posterior inferior calix was targeted allowing placement of a 10-French percutaneous nephrostomy catheter with end coiled and locked within the renal pelvis. Contrast injection confirmed appropriate positioning. IMPRESSION: 1. Attempted though unsuccessful recanalization of the prior nephrostomy catheter track. 2. Successful ultrasound and fluoroscopic guided placement of a right sided 10 [place] PCN. PLAN: The patient was found to be significantly anemic prior to the examination and as such was escorted to the emergency department for further  evaluation and management following successful and uneventful placement of a new right-sided nephrostomy catheter. Electronically Signed   By: Simonne Come M.D.   On: 02/27/2023 11:13   CT ABDOMEN PELVIS WO CONTRAST  Result Date: 02/27/2023 CLINICAL DATA:  History of bladder cancer with inadvertent removal of right-sided nephrostomy catheter several days ago. Please evaluate for recurrent urinary obstruction prior to potential repeat nephrostomy catheter placement. EXAM: CT ABDOMEN AND PELVIS WITHOUT CONTRAST TECHNIQUE: Multidetector CT imaging of the abdomen and pelvis was performed following the standard protocol without IV contrast. RADIATION DOSE REDUCTION: This exam was performed according to the departmental dose-optimization program which includes automated exposure control, adjustment of the mA and/or kV according to patient size and/or use of iterative reconstruction technique. COMPARISON:  CT abdomen pelvis-09/16/2022; 11/07/2021 Image guided right-sided nephrostomy catheter exchange-01/06/2023 FINDINGS: Lower chest: Limited visualization of the lower thorax demonstrates chronic trace left-sided effusion with associated left basilar subsegmental atelectasis/scarring. Normal heart size. Post median sternotomy. There is diffuse decreased attenuation of the intracardiac blood pool suggestive of anemia. Coronary artery calcifications and/or coronary stent placement. No pericardial effusion. Hepatobiliary: Normal hepatic contour. Ill-defined punctate gallstones are seen within otherwise normal-appearing gallbladder. No definitive gallbladder wall thickening or pericholecystic stranding. No ascites. Pancreas: Normal noncontrast appearance of the pancreas. Spleen: Normal noncontrast appearance of the spleen. Adrenals/Urinary Tract: Interval removal of right-sided nephrostomy catheter with recurrence of moderate right-sided ureterectasis and pelvicaliectasis secondary to known right bladder wall tumor at the  level of the right UVJ measuring at least 5.3 x 2.4 cm (image 74, series 2). There are 2 additional nodular masses involving the left side of the urinary bladder base measuring 2.7 x 1.7 cm (image 72, series 2) and at least 2.7 x 1.8 cm (image 79, series 2), similar to the 10/2021 examination and not resulting in left-sided urinary obstruction. Normal noncontrast appearance of the bilateral adrenal glands. Stomach/Bowel: Moderate-to-large stool burden, particularly within the rectal vault, without evidence of enteric obstruction. Scattered colonic diverticulosis without evidence superimposed acute diverticulitis. Normal noncontrast appearance of the terminal ileum and the retrocecal appendix. No significant hiatal hernia. No pneumoperitoneum, pneumatosis or portal venous gas. Vascular/Lymphatic: Atherosclerotic plaque within a normal  caliber abdominal aorta. No bulky retroperitoneal, mesenteric, pelvic or inguinal lymphadenopathy on this noncontrast examination. Reproductive: Normal appearance of the prostate gland. No free fluid in the pelvic cul-de-sac Other: Subcutaneous edema about the midline of the low back. Musculoskeletal: No acute or aggressive osseous abnormalities. Mild degenerative change of the bilateral hips with joint space loss, subchondral sclerosis and osteophytosis. 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 IMPRESSION: 1. Interval removal of right-sided nephrostomy catheter with recurrence of moderate right-sided ureterectasis and pelvicaliectasis secondary to known right bladder wall tumor at the level of the right UVJ measuring at least 5.3 x 2.4 cm. 2. Two additional nodular masses involving the left side of the urinary bladder base measuring 2.7 x 1.7 cm and at least 2.7 x 1.8 cm, similar to the 10/2021 examination and not resulting in left-sided urinary obstruction. 3. Cholelithiasis without evidence of acute cholecystitis. 4. Scattered colonic  diverticulosis without evidence of superimposed acute diverticulitis. 5. Aortic Atherosclerosis (ICD10-I70.0). PLAN: Patient subsequently went on to undergo right-sided nephrostomy catheter placement. Electronically Signed   By: Simonne Come M.D.   On: 02/27/2023 11:10    ------  Elmon Kirschner, NP Pager: [phone]   Please contact the urology consult pager with any further questions/concerns.  I have seen and examined the patient and agree with the above assessment and plan.  1. Muscle-invasive bladder cancer - Clinical Stage 3 with Rt malignany hydro. T2G3 on BX at time of ex-lap for intraperitoneal bladder rupture 08/2022. Staging CT 09/2022 w/o locally advanced or distant disease. Variably compliant for over a year prior to tissue DX. Continues to have on / off gross hematuria.   Initially rec neo-adjuvant chemo, consider cystectomy pending response as he is marginal surgical candidate at best. He refused chemo/med=onc eval ==> palliative goal managmnet.   2. Right hydronephrosis: To level the bladder, consistent with T3 disease. Status post right nephrostomy tube placement.   3. Small Rt Renal Mass - 2cm 90% exophytic Rt posterior mass. 1 artery (early branching / calcified) / 2 vein right renovascular anatomy. NOT avidly enhancing.   4 - End of Life CAre - pt with incurable bladder cancer and poor functional capacity. His goals are palliative.   PMH sig for alcohol abuse, CAD s/p CABG / Clinical CHF (hasn't' seen cards in years), long term smoker, still smokes, GERD. HIs signifcant other [name] is very involved. Retired from [employer] instillation.   Right nephrostomy tube has been exchanged draining clear yellow urine. Foley catheter earlier likely inflated in the prostate and now draining clear on moderate drip. Discussed with family and critical care. Critical care met with family and had extensive discussion regarding goals of care and now would like to proceed with palliative  care/hospice. Under no circumstance would he want to proceed with surgery.  Continue CBI overnight. Wean as able in the morning.   Matt R. Irja Wheless MD [facility]  Pager: [phone]

## 2023-02-27 NOTE — Progress Notes (Signed)
PROGRESS NOTE    Noah Rosales  JYN:[id number] DOB: 1961/03/28 DOA: 02/26/2023 PCP: Pcp, No   Brief Narrative: 62 year old with past medical history significant for CAD status post stent high-grade papillary urothelial carcinoma diagnosed 6 months prior to admission, has right urostomy tube in place.  He is urostomy tube was pulled out by mistake approximately 4 days prior to admission.  He has developed significant hematuria with clots out of his urethra send the tube came out.  He presented for urostomy tube replacement by IR labs performed after procedure showed hemoglobin of 6.3 patient was referred to the ED for further evaluation.  He continue to drinks alcohol last drink was a day prior to admission     Assessment & Plan:   Principal Problem:   Acute post-hemorrhagic anemia Active Problems:   Gross hematuria   Bladder mass   Alcohol abuse  1-Anemia, acute blood loss hemorrhagic due to hematuria:  -Presents with Hb at 6.  -Having hematuria and significant blood clot from urethra.  -Urology Consulted. At bedside evaluation patient, discussing with family about surgical procedure for hematuria.  -hb increase to 7 after one unit PRBC. Will proceed with another unit PRBC>   Alcohol abuse: -Patient agitated. Requiring frequent ativan doses. Also receiving morphine for severe pain.  -CCM consulted for precedex.  -Transfer to step down unit.   High-grade papillary urothelial carcinoma: -  UTI: -UA with more than 50 WBC, more than 50 RBC.  -Follow Urine culture.  -started IV ceftriaxone.   Respiratory system, BL wheezing: -schedule Duonbeb  Hyponatremia:  -Continue with IV fluids.        Estimated body mass index is 31.93 kg/m as calculated from the following:   Height as of 09/08/22: 5\' 8"  (1.727 m).   Weight as of 09/08/22: 95.3 kg.   DVT prophylaxis: SCD Code Status: Full code Family Communication: Urology discussing with wife option for tx Disposition  Plan:  Status is: Inpatient Remains inpatient appropriate because: management of hematuria/.     Consultants:  Urology   Procedures:    Antimicrobials:    Subjective: Saw patient this am. He was sitting recliner agitated, having pain with hematuria. Passing small clot through urethra. Nurse was going to give him ativan.   This afternoon he has been agitated while urology is trying to do bedside procedure. He received Morphine . CCM consulted for precedex.    Objective: Vitals:   02/27/23 0308 02/27/23 0333 02/27/23 0439 02/27/23 0620  BP: 124/68 132/73 138/69 129/73  Pulse: 79 79 79 80  Resp: 18 19 17 18   Temp: 98.4 F (36.9 C) 99.3 F (37.4 C) 98.2 F (36.8 C) 98.3 F (36.8 C)  TempSrc: Oral Oral Oral Oral  SpO2: 95% 94% 100% 99%    Intake/Output Summary (Last 24 hours) at 02/27/2023 0813 Last data filed at 02/27/2023 8657 Gross per 24 hour  Intake 993 ml  Output 1400 ml  Net -407 ml   There were no vitals filed for this visit.  Examination:  General exam: agitated  Respiratory system: Clear to auscultation. Respiratory effort normal. Cardiovascular system: S1 & S2 heard, RRR. Gastrointestinal system: Abdomen is nondistended, soft and nontender. No organomegaly or masses felt. Normal bowel sounds heard. Central nervous system: Alert and oriented.  Extremities: Symmetric 5 x 5 power.   Data Reviewed: I have personally reviewed following labs and imaging studies  CBC: Recent Labs  Lab 02/26/23 1406  WBC 9.4  NEUTROABS 7.0  HGB 6.3*  HCT  23.8*  MCV 71.0*  PLT 277   Basic Metabolic Panel: Recent Labs  Lab 02/26/23 1406  NA 129*  K 4.7  CL 94*  CO2 24  GLUCOSE 96  BUN 14  CREATININE 0.82  CALCIUM 8.0*   GFR: CrCl cannot be calculated (Unknown ideal weight.). Liver Function Tests: No results for input(s): "AST", "ALT", "ALKPHOS", "BILITOT", "PROT", "ALBUMIN" in the last 168 hours. No results for input(s): "LIPASE", "AMYLASE" in the last  168 hours. No results for input(s): "AMMONIA" in the last 168 hours. Coagulation Profile: Recent Labs  Lab 02/26/23 1423  INR 1.9*   Cardiac Enzymes: No results for input(s): "CKTOTAL", "CKMB", "CKMBINDEX", "TROPONINI" in the last 168 hours. BNP (last 3 results) No results for input(s): "PROBNP" in the last 8760 hours. HbA1C: No results for input(s): "HGBA1C" in the last 72 hours. CBG: No results for input(s): "GLUCAP" in the last 168 hours. Lipid Profile: No results for input(s): "CHOL", "HDL", "LDLCALC", "TRIG", "CHOLHDL", "LDLDIRECT" in the last 72 hours. Thyroid Function Tests: No results for input(s): "TSH", "T4TOTAL", "FREET4", "T3FREE", "THYROIDAB" in the last 72 hours. Anemia Panel: No results for input(s): "VITAMINB12", "FOLATE", "FERRITIN", "TIBC", "IRON", "RETICCTPCT" in the last 72 hours. Sepsis Labs: No results for input(s): "PROCALCITON", "LATICACIDVEN" in the last 168 hours.  No results found for this or any previous visit (from the past 240 hour(s)).       Radiology Studies: No results found.      Scheduled Meds:  folic acid  1 mg Oral Daily   multivitamin with minerals  1 tablet Oral Daily   thiamine  100 mg Oral Daily   Or   thiamine  100 mg Intravenous Daily   Continuous Infusions:   LOS: 1 day    Time spent: 35 minutes.     Alba Cory, MD [facility]   If 7PM-7AM, please contact night-coverage [email or web address]  02/27/2023, 8:13 AM

## 2023-02-27 NOTE — Progress Notes (Signed)
OT Cancellation Note  Patient Details Name: [name] MRN: [id number] DOB: [birth year]   Cancelled Treatment:    Reason Eval/Treat Not Completed: Patient not medically ready.  Spoke with nursing and recommended holding OT eval at this time as pt is currently exhibiting withdrawal symptoms and will be going to surgery today for bladder issues. Will follow up with pt and complete OT evaluation when able.    Limmie Patricia, OTR/L,CBIS  Supplemental OT - [facility] and [facility] Secure Chat Preferred   02/27/2023, 12:05 PM

## 2023-02-27 NOTE — TOC Initial Note (Addendum)
Transition of Care [facility]) - Initial/Assessment Note    Patient Details  Name: Noah Rosales MRN: [id number] Date of Birth: 07/06/1960  Transition of Care [facility]) CM/SW Contact:    Lavenia Atlas, RN Phone Number: 02/27/2023, 5:55 PM  Clinical Narrative:   Received TOC consult for residential hospice vs home hospice. This RNCM attempt to speak with patient's family at bedside however, family has left for the day. Will attempt to contact patient's brother.  -6:09 This RNCM left HIPAA complaint voicemail message for return call.   -6:20pm Received call back from patient's brother Rosanne Ashing. This RNCM offered choice for home hospice services. Will email ([email or web address]) a copy of home hospice choices ([email or web address] in zip code [phone] for review.     TOC will continue to follow.                Expected Discharge Plan:  (unknown: home hospice vs. residential hospice) Barriers to Discharge: Continued Medical Work up   Patient Goals and CMS Choice Patient states their goals for this hospitalization and ongoing recovery are:: UTA, sleeping   Choice offered to / list presented to : NA [place] ownership interest in [facility].provided to::  (N/A)    Expected Discharge Plan and Services In-house Referral: NA Discharge Planning Services: CM Consult Post Acute Care Choice: Hospice Living arrangements for the past 2 months: Single Family Home                 DME Arranged: N/A DME Agency: NA       HH Arranged: NA HH Agency: NA        Prior Living Arrangements/Services Living arrangements for the past 2 months: Single Family Home Lives with:: Significant Other, Self Patient language and need for interpreter reviewed:: Yes Do you feel safe going back to the place where you live?: Yes      Need for Family Participation in Patient Care: Yes (Comment) Care giver support system in place?: Yes (comment) Current home services: Other (comment) (N/A) Criminal  Activity/Legal Involvement Pertinent to Current Situation/Hospitalization: No - Comment as needed  Activities of Daily Living   ADL Screening (condition at time of admission) Independently performs ADLs?: Yes (appropriate for developmental age) Is the patient deaf or have difficulty hearing?: No Does the patient have difficulty seeing, even when wearing glasses/contacts?: No Does the patient have difficulty concentrating, remembering, or making decisions?: No  Permission Sought/Granted Permission sought to share information with : Case Manager Permission granted to share information with : Yes, Verbal Permission Granted  Share Information with NAME: RN Case manager           Emotional Assessment Appearance:: Appears stated age Attitude/Demeanor/Rapport: Unable to Assess Affect (typically observed): Unable to Assess   Alcohol / Substance Use: Tobacco Use, Alcohol Use Psych Involvement: No (comment)  Admission diagnosis:  Acute post-hemorrhagic anemia [D62] Gross hematuria [R31.0] Symptomatic anemia [D64.9] Patient Active Problem List   Diagnosis Date Noted   Encephalopathy acute 02/27/2023   Malignant neoplasm of urinary bladder (HCC) 02/27/2023   Pressure injury of skin 02/27/2023   Acute post-hemorrhagic anemia 02/26/2023   Hematuria 09/09/2022   Hyponatremia 11/08/2021   Bladder mass 11/08/2021   Gross hematuria 11/07/2021   CAD (coronary artery disease)    GERD (gastroesophageal reflux disease)    Myocardial infarction (HCC)    Alcohol abuse    PCP:  Pcp, No Pharmacy:   [id number] - [place], [place] - [address] [facility]  [address] [place] [address] Phone: [phone] Fax: [phone]     Social Determinants of Health (SDOH) Social History: SDOH Screenings   Food Insecurity: No Food Insecurity (02/26/2023)  Housing: Low Risk  (02/26/2023)  Transportation Needs: No Transportation Needs (02/26/2023)  Utilities:  Not At Risk (02/26/2023)  Tobacco Use: High Risk (02/26/2023)   SDOH Interventions:     Readmission Risk Interventions    02/27/2023    5:07 PM  Readmission Risk Prevention Plan  Transportation Screening Complete  PCP or Specialist Appt within 3-5 Days Complete  HRI or Home Care Consult Complete  Social Work Consult for Recovery Care Planning/Counseling Complete  Palliative Care Screening Complete  Medication Review Oceanographer) Complete

## 2023-02-27 NOTE — Consult Note (Addendum)
NAME:  Noah Rosales, MRN:  [id number], DOB:  1960-07-22, LOS: 1 ADMISSION DATE:  02/26/2023, CONSULTATION DATE:  02/27/23 REFERRING MD:  Sunnie Nielsen , CHIEF COMPLAINT:  AMS    History of Present Illness:  62 yo M PMH extensive high grade bladder cancer req R neph tube (declined aggressive ca tx, elected for serial scans and palliative care) etoh use (reportedly about 25drinks/d), CAD who presented to ED 11/13 after his neph tube was removed accidentally when he tripped playing pool several days prior. Underwent urostomy placement w IR 11/13 and admitted to [facility]. Noted to have hematuria at time of admission and hgb 6.3, given 2 PRBC.  He reported to admitting team that last etoh was 11/12    11/14 was seen by urology for ongoing hematuria. Also started to have periods of shaking ? Sz like activity, ?worse etoh withdrawals and bladder spasms.  Scoped at the bedside by uro for his ongoing hematuria and blood clots, balloon from foley was dislodged and in his prostate.    PCCM is  consulted in setting of ongoing agitation, and ?sz like activity -- c/f Dts Pertinent  Medical History  Etoh abuse Extensive high grade bladder cancer   Significant Hospital Events: Including procedures, antibiotic start and stop dates in addition to other pertinent events   11/13 neph tube replaced after his dislodged outpt days prior. Hematuria, anemia. Transfused 2 PRBC 11/14 hematuria ongoing, uro consulted - bedside cysto w foley balloon impacting prostate . PCCM consult for ?sx like activity c/f DTs   Interim History / Subjective:  Underwent bedside scope w uro -- discussed findings w provider   Having spasm like mfull body movements. He can tell me his gf name etc. But is clearly sedate. Has received morphine and ativan today   Objective   Blood pressure (!) 142/86, pulse (!) 106, temperature 99.2 F (37.3 C), temperature source Axillary, resp. rate (!) 25, height 5\' 8"  (1.727 m), weight 95.3 kg, SpO2 98%.         Intake/Output Summary (Last 24 hours) at 02/27/2023 1437 Last data filed at 02/27/2023 1400 Gross per 24 hour  Intake 993 ml  Output 3275 ml  Net -2282 ml   Filed Weights   02/27/23 1401  Weight: 95.3 kg    Examination: General: chronically and acute ill M who appears older than stated age  HENT: pink mm anicteric sclera  Lungs: symmetrical chest expansion  Cardiovascular: tachycardic cap refill < 3 sec  Abdomen: soft  Extremities: no acute joint deformity  Neuro: lethargic, oriented x1. Intermittent myoclonic movements   GU: hematuria, foley connected to [facility] Problem list     Assessment & Plan:   Acute encephalopathy EtOH abuse with withdrawal  Sz like activity  Hyponatremia  Extensive high grade bladder cancer Bladder spasms Hematuria  Malfunction of foley catheter  ABLA + chronic anemia  -had opted against aggressive tx for his cancer (which would be surgery) in favor of palliative approach + serial scans. At this point w his frailty and etoh use, would be a high risk candidate  P -txf to SDU / ICU  -will start precedex -cont CIWA, micronutrient support  -movements aren't really c/w sz, think might be pain/spasm related. PRNs available for this   -CBI per uro  -add'l 1 PRBC ordered for hgb 7.3 -Follow CBC BMP PRN Coags  -really we need to est GOC. Pt has a gf of 75yr - [name]- who would be common law  wife. Cam with uro has spoken with her and she defers decisions re GOC to pts brother. Brother is planning on coming to the hospital today and we will address then    Best Practice (right click and "Reselect all SmartList Selections" daily)   Diet/type: NPO DVT prophylaxis: SCD GI prophylaxis: N/A Lines: N/A Foley:  N/A Code Status:  full code Last date of multidisciplinary goals of care discussion [--]  Labs   CBC: Recent Labs  Lab 02/26/23 1406 02/27/23 1247  WBC 9.4 11.4*  NEUTROABS 7.0  --   HGB 6.3* 7.3*  HCT 23.8*  25.2*  MCV 71.0* 73.9*  PLT 277 250    Basic Metabolic Panel: Recent Labs  Lab 02/26/23 1406 02/27/23 0744  NA 129* 130*  K 4.7 3.6  CL 94* 94*  CO2 24 28  GLUCOSE 96 134*  BUN 14 13  CREATININE 0.82 0.84  CALCIUM 8.0* 7.9*   GFR: Estimated Creatinine Clearance: 102.1 mL/min (by C-G formula based on SCr of 0.84 mg/dL). Recent Labs  Lab 02/26/23 1406 02/27/23 1247  WBC 9.4 11.4*    Liver Function Tests: No results for input(s): "AST", "ALT", "ALKPHOS", "BILITOT", "PROT", "ALBUMIN" in the last 168 hours. No results for input(s): "LIPASE", "AMYLASE" in the last 168 hours. No results for input(s): "AMMONIA" in the last 168 hours.  ABG    Component Value Date/Time   PHART 7.404 11/23/2010 0958   PCO2ART 33.2 (L) 11/23/2010 0958   PO2ART 97.0 11/23/2010 0958   HCO3 20.6 11/23/2010 0958   TCO2 23 11/23/2010 1807   ACIDBASEDEF 3.0 (H) 11/23/2010 0958   O2SAT 97.0 11/23/2010 0958     Coagulation Profile: Recent Labs  Lab 02/26/23 1423 02/27/23 1020  INR 1.9* 1.2    Cardiac Enzymes: No results for input(s): "CKTOTAL", "CKMB", "CKMBINDEX", "TROPONINI" in the last 168 hours.  HbA1C: Hgb A1c MFr Bld  Date/Time Value Ref Range Status  11/20/2010 01:47 PM 6.0 (H) <5.7 % Final    Comment:    (NOTE)                                                                       According to the ADA Clinical Practice Recommendations for 2011, when HbA1c is used as a screening test:  >=6.5%   Diagnostic of Diabetes Mellitus           (if abnormal result is confirmed) 5.7-6.4%   Increased risk of developing Diabetes Mellitus References:Diagnosis and Classification of Diabetes Mellitus,Diabetes Care,2011,34(Suppl 1):S62-S69 and Standards of Medical Care in         Diabetes - 2011,Diabetes Care,2011,34 (Suppl 1):S11-S61.    CBG: No results for input(s): "GLUCAP" in the last 168 hours.  Review of Systems:   Unreliable due to encephalopathy   Past Medical History:  He,   has a past medical history of Alcohol abuse, CAD (coronary artery disease), Dyspnea, GERD (gastroesophageal reflux disease), and Myocardial infarction (HCC) (04/16/2003).   Surgical History:   Past Surgical History:  Procedure Laterality Date   CYSTOSCOPY WITH RETROGRADE PYELOGRAM, URETEROSCOPY AND STENT PLACEMENT N/A 09/09/2022   Procedure: CYSTOSCOPY WITH CLOT EVACUATION, CYSTOGRAM CONVERTED TO OPEN BLADDER EXPLORATION;  Surgeon: Rene Paci, MD;  Location: [facility] OR;  Service: Urology;  Laterality: N/A;   Exploration of mediastinum, extracorporeal circulation,  11/23/10   Dr Laneta Simmers   IR NEPHROSTOMY EXCHANGE RIGHT  11/11/2022   IR NEPHROSTOMY EXCHANGE RIGHT  01/06/2023   IR NEPHROSTOMY PLACEMENT RIGHT  09/10/2022   IR NEPHROSTOMY PLACEMENT RIGHT  02/26/2023   s/p cagb x 4  11/22/10   Dr Laneta Simmers     Social History:   reports that he has been smoking pipe. He does not have any smokeless tobacco history on file. He reports current alcohol use of about 56.0 standard drinks of alcohol per week. He reports that he does not currently use drugs.   Family History:  His family history includes Heart disease in an other family member.   Allergies Allergies  Allergen Reactions   Sertraline Hcl Rash and Other (See Comments)    "Zoloft"     Home Medications  Prior to Admission medications   Medication Sig Start Date End Date Taking? Authorizing Provider  oxyCODONE (OXY IR/ROXICODONE) 5 MG immediate release tablet Take 5-10 mg by mouth 2 (two) times daily as needed (for pain). 01/21/23  Yes [provider]  TYLENOL 500 MG tablet Take 500-1,000 mg by mouth every 6 (six) hours as needed for mild pain (pain score 1-3) or headache.   Yes [provider]  folic acid (FOLVITE) 1 MG tablet Take 1 tablet (1 mg total) by mouth daily. Patient not taking: Reported on 02/26/2023 09/14/22   Burnadette Pop, MD  polyethylene glycol powder (GLYCOLAX/MIRALAX) 17 GM/SCOOP powder Take 17 g by  mouth daily. Patient not taking: Reported on 02/26/2023 09/14/22   Burnadette Pop, MD  potassium chloride SA (KLOR-CON M) 20 MEQ tablet Take 2 tablets (40 mEq total) by mouth daily for 5 days. Patient not taking: Reported on 02/26/2023 09/14/22 02/26/23  Burnadette Pop, MD  thiamine (VITAMIN B1) 100 MG tablet Take 1 tablet (100 mg total) by mouth daily. Patient not taking: Reported on 02/26/2023 09/14/22   Burnadette Pop, MD     Critical care time: 35 min    CRITICAL CARE Performed by: Lanier Clam   Total critical care time: 35 minutes  Critical care time was exclusive of separately billable procedures and treating other patients.  Critical care was necessary to treat or prevent imminent or life-threatening deterioration.  Critical care was time spent personally by me on the following activities: development of treatment plan with patient and/or surrogate as well as nursing, discussions with consultants, evaluation of patient's response to treatment, examination of patient, obtaining history from patient or surrogate, ordering and performing treatments and interventions, ordering and review of laboratory studies, ordering and review of radiographic studies, pulse oximetry and re-evaluation of patient's condition.  Tessie Fass MSN, AGACNP-BC [facility] Amion for pager  02/27/2023, 2:44 PM

## 2023-02-27 NOTE — Evaluation (Addendum)
Physical Therapy Evaluation Patient Details Name: Noah Rosales MRN: [id number] DOB: Sep 13, 1960 Today's Date: 02/27/2023  History of Present Illness  Noah Rosales is a 62 y.o. male with medical history significant for CAD,CABG,ETOH,MI, GERD,  and stent and high-grade papillary urothelial carcinoma , right urostomy tube. The urostomy tube was pulled out by mistake approximately 4 days ago.  The patient is able to urinate despite losing the urostomy tube.  He has had significant hematuria with clots out of his urethra since the tube came out.  02/26/23 the urostomy tube replaced by IR.  Post  procedure, labs revealed a hemoglobin of 6.3, admitted.  Clinical Impression  Pt admitted with above diagnosis.  Pt currently with functional limitations due to the deficits listed below (see PT Problem List). Pt will benefit from acute skilled PT to increase their independence and safety with mobility to allow discharge.     The patient found seated in recliner with blood on legs and floor. RN called to room. Assisted  patient to ambulate to BR. Patient stating" I need to pass a clot."  Patient currently very unsteady with ambulation, noted tremors and  legs shaking.  No family present to determine caregiver availability. Anticipate patient  will progress once bleeding  issues resolve.      If plan is discharge home, recommend the following: A little help with walking and/or transfers;Assistance with cooking/housework;Assist for transportation;Help with stairs or ramp for entrance   Can travel by private vehicle        Equipment Recommendations None recommended by PT  Recommendations for Other Services       Functional Status Assessment Patient has had a recent decline in their functional status and demonstrates the ability to make significant improvements in function in a reasonable and predictable amount of time.     Precautions / Restrictions Precautions Precaution Comments: bleeding from  penis, urosptomy Restrictions Weight Bearing Restrictions: No      Mobility  Bed Mobility               General bed mobility comments: in recliner sitting upright    Transfers Overall transfer level: Needs assistance Equipment used: Rolling walker (2 wheels) Transfers: Sit to/from Stand Sit to Stand: Min assist           General transfer comment: patient stands with 1 hand on RW    Ambulation/Gait Ambulation/Gait assistance: Mod assist Gait Distance (Feet): 20 Feet Assistive device: Rolling walker (2 wheels) Gait Pattern/deviations: Step-through pattern, Staggering left, Staggering right, Shuffle, Wide base of support       General Gait Details: patient  holding onto RW , quickly amb. to BR , RN in room and aware of bleeding  Stairs            Wheelchair Mobility     Tilt Bed    Modified Rankin (Stroke Patients Only)       Balance Overall balance assessment: Needs assistance Sitting-balance support: No upper extremity supported, Feet supported Sitting balance-Leahy Scale: Fair     Standing balance support: Reliant on assistive device for balance, Bilateral upper extremity supported, During functional activity Standing balance-Leahy Scale: Poor                               Pertinent Vitals/Pain Pain Assessment Pain Assessment: Faces Faces Pain Scale: Hurts whole lot Pain Location: 'I am trying to pass a clot" Pain Descriptors / Indicators: Burning, Grimacing, Discomfort  Pain Intervention(s): Monitored during session, Limited activity within patient's tolerance    Home Living Family/patient expects to be discharged to:: Private residence Living Arrangements: Spouse/significant other Available Help at Discharge: Family;Available 24 hours/day Type of Home: Mobile home Home Access: Stairs to enter;Ramped entrance Entrance Stairs-Rails: Can reach both Entrance Stairs-Number of Steps: 2   Home Layout: One level Home  Equipment: Agricultural consultant (2 wheels);Shower seat;Grab bars - tub/shower      Prior Function Prior Level of Function : Independent/Modified Independent             Mobility Comments: walked with a RW occasionally ADLs Comments: difficulty with lower body dressing at baseline     Extremity/Trunk Assessment   Upper Extremity Assessment Upper Extremity Assessment: Overall WFL for tasks assessed    Lower Extremity Assessment Lower Extremity Assessment: Generalized weakness    Cervical / Trunk Assessment Cervical / Trunk Assessment: Other exceptions Cervical / Trunk Exceptions: keeps flexed forward , holding cloth to penis  due to  bleeding  Communication   Communication Communication: No apparent difficulties  Cognition Arousal: Alert Behavior During Therapy: Anxious Overall Cognitive Status: Difficult to assess                                 General Comments: patient alert and oriented to place, preoccupied with bleeding  so was not focused on questions        General Comments      Exercises     Assessment/Plan    PT Assessment Patient needs continued PT services  PT Problem List Decreased strength;Decreased balance;Decreased knowledge of precautions;Decreased mobility;Decreased activity tolerance;Pain;Decreased safety awareness;Decreased skin integrity       PT Treatment Interventions DME instruction;Therapeutic activities;Gait training;Therapeutic exercise;Patient/family education;Balance training;Functional mobility training    PT Goals (Current goals can be found in the Care Plan section)  Acute Rehab PT Goals Patient Stated Goal: to stop bleeding PT Goal Formulation: With patient Time For Goal Achievement: 03/13/23 Potential to Achieve Goals: Fair    Frequency Min 1X/week     Co-evaluation               AM-PAC PT "6 Clicks" Mobility  Outcome Measure Help needed turning from your back to your side while in a flat bed without  using bedrails?: A Little Help needed moving from lying on your back to sitting on the side of a flat bed without using bedrails?: A Little Help needed moving to and from a bed to a chair (including a wheelchair)?: A Little Help needed standing up from a chair using your arms (e.g., wheelchair or bedside chair)?: A Little Help needed to walk in hospital room?: A Lot Help needed climbing 3-5 steps with a railing? : A Lot 6 Click Score: 16    End of Session   Activity Tolerance: Treatment limited secondary to medical complications (Comment) (bleeding) Patient left:  (in BR on tpoilet with RN in roo) Nurse Communication: Mobility status (bleeding) PT Visit Diagnosis: Unsteadiness on feet (R26.81);Muscle weakness (generalized) (M62.81);Difficulty in walking, not elsewhere classified (R26.2)    Time: 1610-9604 PT Time Calculation (min) (ACUTE ONLY): 24 min   Charges:   PT Evaluation $PT Eval Low Complexity: 1 Low PT Treatments $Gait Training: 8-22 mins PT General Charges $$ ACUTE PT VISIT: 1 Visit         Blanchard Kelch PT Acute Rehabilitation Services Office [phone] Weekend pager-[phone]   Rada Hay 02/27/2023,  1:32 PM

## 2023-02-27 NOTE — Plan of Care (Signed)
Discussed with patient plan of care for the evening, pain management and medications with some teach back displayed.  Updated wife when she called about his condition and let him know she called since he was resting with eyes closed at the time.  What is important to patient is pain control for his bladder spasms which causes his body to shake.  Problem: Education: Goal: Knowledge of General Education information will improve Description: Including pain rating scale, medication(s)/side effects and non-pharmacologic comfort measures Outcome: Not Progressing   Problem: Health Behavior/Discharge Planning: Goal: Ability to manage health-related needs will improve Outcome: Progressing   Problem: Pain Management: Goal: General experience of comfort will improve Outcome: Not Progressing

## 2023-02-28 DIAGNOSIS — D62 Acute posthemorrhagic anemia: Secondary | ICD-10-CM | POA: Diagnosis not present

## 2023-02-28 DIAGNOSIS — G9340 Encephalopathy, unspecified: Secondary | ICD-10-CM

## 2023-02-28 DIAGNOSIS — N3289 Other specified disorders of bladder: Secondary | ICD-10-CM | POA: Diagnosis not present

## 2023-02-28 DIAGNOSIS — R310 Gross hematuria: Secondary | ICD-10-CM | POA: Diagnosis not present

## 2023-02-28 DIAGNOSIS — F1010 Alcohol abuse, uncomplicated: Secondary | ICD-10-CM | POA: Diagnosis not present

## 2023-02-28 LAB — URINE CULTURE

## 2023-02-28 NOTE — Hospital Course (Signed)
62 years old male with past medical history of extensive high grade bladder cancer  requiring right neph tube (declined aggressive ca tx, elected for serial scans and palliative care) alcohol use (reportedly about 25drinks/d), CAD who presented to ED 11/13 after his neph tube was removed accidentally when he tripped playing pool several days prior. Underwent urostomy placement with IR 11/13 and admitted to [facility]. Noted to have hematuria at time of admission and hgb 6.3, given 2 PRBC.  On 11/14 was seen by urology for ongoing hematuria.  Patient also started to have periods of shaking like Sz like activity, worse etoh withdrawals and bladder spasms.  Urology scoped the patient at bedside for blood clots. PCCM was consulted in setting of ongoing agitation, and seizure-like  activity.  At this time patient has been transferred out of the ICU after goals of care discussion.  Assessment and plan  Acute encephalopathy EtOH abuse with withdrawal  Sz like activity  Hyponatremia  Extensive high grade bladder cancer Bladder spasms Hematuria  Malfunction of foley catheter  ABLA + chronic anemia   Patient has declined aggressive treatment including surgery and plan is to proceed with palliative level of care.  Was on Precedex for agitation.  Currently on Cipro 0.1 and appears to be more calm.  On CBI for hematuria.  Received PRBC during hospitalization.  Goals of care discussion. Goals of care discussion was done with family by PCCM.  At this time plan is palliative care.  Palliative care has been consulted.

## 2023-02-28 NOTE — Progress Notes (Addendum)
Subjective: Patient relatively stable overnight.  Early this morning CBI clotted off.  Drip chamber overfilled and unable to assess.  Patient was flailing around in distress again.  Nursing hand irrigated significant amount of clot material.  This is been the first time clot material have been observed.  I hand irrigated again myself and confirmed flow, drip rate adjusted, drip chamber corrected.  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  They presented to bedside and shared decision was made to move to comfort measures, in keeping with the patient's wishes of palliation.  After he has moved to ICU and less sedation was required, he was able to reiterate his wishes to me.  Stricture dilated at bedside during catheter placement procedure.  Hematuria is ongoing but CBI is at a low rate.  Continue to hand irrigate as needed and will monitor him daily.   11/14 0701 - 11/15 0700 In: 82956 [P.O.:120; I.V.:1015; IV Piggyback:100] Out: 21308 [Urine:51750]  Intake/Output this shift: Total I/O In: 4235.1 [P.O.:200; I.V.:53.8; Other:3900; IV Piggyback:81.4] Out: 5800 [Urine:5800]  Physical Exam:  General: Alert and oriented CV: No cyanosis Lungs: equal chest rise Abdomen: Soft, NTND, no rebound or guarding Gu: CBI with light pink irrigant on low gtt.  Lab Results: Recent Labs    02/26/23 1406 02/27/23 1247  HGB 6.3* 7.3*  HCT 23.8* 25.2*   BMET Recent Labs    02/26/23 1406 02/27/23 0744  NA 129* 130*  K 4.7 3.6  CL 94*  94*  CO2 24 28  GLUCOSE 96 134*  BUN 14 13  CREATININE 0.82 0.84  CALCIUM 8.0* 7.9*     Studies/Results: IR NEPHROSTOMY PLACEMENT RIGHT  Result Date: 02/27/2023 INDICATION: History of bladder cancer, post image guided placement of right sided nephrostomy catheter on 09/10/2022 with subsequent exchanges on 11/11/2022 and 01/06/2023. The patient's nephrostomy catheter was inadvertently removed at the end of last week. As such the patient presents today for image guided placement of a new nephrostomy catheter for urinary diversion purposes following acquisition of noncontrast CT scan performed earlier same day demonstrating recurrence of right-sided moderate ureterectasis and pelvicaliectasis. EXAM: ULTRASOUND AND FLUOROSCOPIC GUIDED PLACEMENT OF RIGHT NEPHROSTOMY TUBE COMPARISON:  CT abdomen pelvis-earlier same day Fluoroscopic guided exchange of right-sided nephrostomy catheter-01/06/2023; 11/11/2022 Image guided placement of right-sided nephrostomy catheter-09/10/2022 MEDICATIONS: Ciprofloxacin 400 mg IV; The antibiotic was administered in an appropriate time frame prior to skin puncture. ANESTHESIA/SEDATION: Moderate (conscious) sedation was employed during this procedure. A total of Versed 4 mg and Fentanyl 100 mcg was administered intravenously. Moderate Sedation Time: 15 minutes. The patient's level of consciousness and vital signs were monitored continuously by radiology nursing throughout the procedure under my direct supervision. CONTRAST:  10 mL Isovue 300 - administered into the renal collecting system FLUOROSCOPY TIME:  1 minute, 30 seconds (38 mGy) COMPLICATIONS: None immediate. PROCEDURE: The procedure, risks, benefits, and alternatives were explained to the patient, questions were encouraged and answered and informed consent was obtained. A timeout was performed prior to the initiation of the procedure. The operative site  was prepped and draped in the usual sterile fashion and a sterile  drape was applied covering the operative field. A sterile gown and sterile gloves were used for the procedure. Local anesthesia was provided with 1% Lidocaine with epinephrine. Attempts were initially made to recannulate the prior nephrostomy catheter track however this proved unsuccessful secondary to complete track healing. As such, ultrasound was used to localize the right kidney. Under direct ultrasound guidance, a 20 gauge needle was advanced into the renal collecting system. An ultrasound image documentation was performed. Access within the collecting system was confirmed with the efflux of urine followed by limited contrast injection. Under intermittent fluoroscopic guidance, an 0.018 wire was advanced into the collecting system and the tract was dilated with an Accustick stent. Next, over a short Amplatz wire, the track was further dilated ultimately allowing placement of a 10-French percutaneous nephrostomy catheter with end coiled and locked within the renal pelvis. Contrast was injected and several spot fluoroscopic images were obtained in various obliquities. The catheter was secured at the skin entrance site with an interrupted suture and a stat lock device and connected to a gravity bag. Dressings were applied. The patient tolerated procedure well without immediate postprocedural complication. FINDINGS: Complete healing of prior right-sided nephrostomy catheter track. Ultrasound scanning demonstrates a moderately right collecting system. Under a combination of ultrasound and fluoroscopic guidance, a posterior inferior calix was targeted allowing placement of a 10-French percutaneous nephrostomy catheter with end coiled and locked within the renal pelvis. Contrast injection confirmed appropriate positioning. IMPRESSION: 1. Attempted though unsuccessful recanalization of the prior nephrostomy catheter track. 2. Successful ultrasound and fluoroscopic guided placement of a right sided 10 [place] PCN. PLAN:  The patient was found to be significantly anemic prior to the examination and as such was escorted to the emergency department for further evaluation and management following successful and uneventful placement of a new right-sided nephrostomy catheter. Electronically Signed   By: Simonne Come M.D.   On: 02/27/2023 11:13   CT ABDOMEN PELVIS WO CONTRAST  Result Date: 02/27/2023 CLINICAL DATA:  History of bladder cancer with inadvertent removal of right-sided nephrostomy catheter several days ago. Please evaluate for recurrent urinary obstruction prior to potential repeat nephrostomy catheter placement. EXAM: CT ABDOMEN AND PELVIS WITHOUT CONTRAST TECHNIQUE: Multidetector CT imaging of the abdomen and pelvis was performed following the standard protocol without IV contrast. RADIATION DOSE REDUCTION: This exam was performed according to the departmental dose-optimization program which includes automated exposure control, adjustment of the mA and/or kV according to patient size and/or use of iterative reconstruction technique. COMPARISON:  CT abdomen pelvis-09/16/2022; 11/07/2021 Image guided right-sided nephrostomy catheter exchange-01/06/2023 FINDINGS: Lower chest: Limited visualization of the lower thorax demonstrates chronic trace left-sided effusion with associated left basilar subsegmental atelectasis/scarring. Normal heart size. Post median sternotomy. There is diffuse decreased attenuation of the intracardiac blood pool suggestive of anemia. Coronary artery calcifications and/or coronary stent placement. No pericardial effusion. Hepatobiliary: Normal hepatic contour. Ill-defined punctate gallstones are seen within otherwise normal-appearing gallbladder. No definitive gallbladder wall thickening or pericholecystic stranding. No ascites. Pancreas: Normal noncontrast appearance of the pancreas. Spleen: Normal noncontrast appearance of the spleen. Adrenals/Urinary Tract: Interval removal of right-sided  nephrostomy catheter with recurrence of moderate right-sided ureterectasis and pelvicaliectasis secondary to known right bladder wall tumor at the level of the right UVJ measuring at least 5.3 x 2.4 cm (image 74, series 2). There are 2 additional nodular masses involving the left side of the urinary bladder base measuring 2.7 x 1.7 cm (  image 72, series 2) and at least 2.7 x 1.8 cm (image 79, series 2), similar to the 10/2021 examination and not resulting in left-sided urinary obstruction. Normal noncontrast appearance of the bilateral adrenal glands. Stomach/Bowel: Moderate-to-large stool burden, particularly within the rectal vault, without evidence of enteric obstruction. Scattered colonic diverticulosis without evidence superimposed acute diverticulitis. Normal noncontrast appearance of the terminal ileum and the retrocecal appendix. No significant hiatal hernia. No pneumoperitoneum, pneumatosis or portal venous gas. Vascular/Lymphatic: Atherosclerotic plaque within a normal caliber abdominal aorta. No bulky retroperitoneal, mesenteric, pelvic or inguinal lymphadenopathy on this noncontrast examination. Reproductive: Normal appearance of the prostate gland. No free fluid in the pelvic cul-de-sac Other: Subcutaneous edema about the midline of the low back. Musculoskeletal: No acute or aggressive osseous abnormalities. Mild degenerative change of the bilateral hips with joint space loss, subchondral sclerosis and osteophytosis. 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 IMPRESSION: 1. Interval removal of right-sided nephrostomy catheter with recurrence of moderate right-sided ureterectasis and pelvicaliectasis secondary to known right bladder wall tumor at the level of the right UVJ measuring at least 5.3 x 2.4 cm. 2. Two additional nodular masses involving the left side of the urinary bladder base measuring 2.7 x 1.7 cm and at least 2.7 x 1.8 cm, similar to the 10/2021  examination and not resulting in left-sided urinary obstruction. 3. Cholelithiasis without evidence of acute cholecystitis. 4. Scattered colonic diverticulosis without evidence of superimposed acute diverticulitis. 5. Aortic Atherosclerosis (ICD10-I70.0). PLAN: Patient subsequently went on to undergo right-sided nephrostomy catheter placement. Electronically Signed   By: Simonne Come M.D.   On: 02/27/2023 11:10      LOS: 2 days   Elmon Kirschner, NP [facility] Pager: [phone]  02/28/2023, 10:21 AM   I have seen and examined the patient and agree with the above assessment and plan.  Agree with hand irrigation and CBI as needed for comfort measures. Under no circumstance does the patient want to proceed with any further surgery per his wishes.  Matt R. Skai Lickteig MD [facility]  Pager: [phone]

## 2023-02-28 NOTE — Progress Notes (Signed)
I have spoke with patient brother Noah Rosales at [employer] about Hospice services at home. He is in agreement to comfort care at home.  He did inform me that patient long term girlfriend Noah Rosales will be providing his care at home.  Patient reviewed and approved by Hospice MD, Dr. Antonietta Breach. DME has been ordered and will be delivered to patient home tomorrow. Jim request patient to not be discharged until Sunday to allow them to get the home prepared for patient to come home.  Feel free to call with any questions or concerns. Evette Doffing, RN BSN [facility] [phone]

## 2023-02-28 NOTE — Consult Note (Addendum)
Consultation Note Date: 02/28/2023   Patient Name: Noah Rosales  DOB: [birth year]  MRN: [id number]  Age / Sex: 62 y.o., male  PCP: Pcp, No Referring Physician: Joycelyn Das, MD  Reason for Consultation: Establishing goals of care  HPI/Patient Profile: 62 y.o. male admitted on 02/26/2023    Clinical Assessment and Goals of Care:  61 years old male with past medical history of extensive high grade bladder cancer requiring right neph tube (declined aggressive ca tx, elected for serial scans and palliative care) alcohol use (reportedly about 25drinks/d), CAD who presented to ED 11/13 after his neph tube was removed accidentally when he tripped playing pool several days prior. Underwent urostomy placement with IR 11/13 and admitted to [facility]. Noted to have hematuria at time of admission and hgb 6.3, given 2 PRBC. On 11/14 was seen by urology for ongoing hematuria. Patient also started to have periods of shaking like Sz like activity, worse etoh withdrawals and bladder spasms. Urology scoped the patient at bedside for blood clots. PCCM was consulted in setting of ongoing agitation, and seizure-like activity.  Goals of care discussions were held by PCCM - patient made his wishes known that he would not want aggressive measures, or invasive measures and would elect for symptom management, he would not want surgery or any artificial resuscitative efforts.  With this, decision reached to pursue comfort focused care with goal of home hospice vs residential hospice pending course here.  PMT consulted for additional support.  Chart reviewed, patient seen, discussed with brother, significant other and other family members at bedside.  Palliative medicine is specialized medical care for people living with serious illness. It focuses on providing relief from the symptoms and stress of a serious illness. The goal is to improve  quality of life for both the patient and the family. Goals of care: Broad aims of medical therapy in relation to the patient's values and preferences. Our aim is to provide medical care aimed at enabling patients to achieve the goals that matter most to them, given the circumstances of their particular medical situation and their constraints.   See below.     NEXT OF KIN  Brother and significant other   SUMMARY OF RECOMMENDATIONS    Agree with DNR Agree with comfort measures Goals of care and disposition discussions: Explained hospice philosophy of care to patient, brother, significant other and other family members at bedside, compared home with hospice versus residential hospice. Patient wants to go home with hospice. Described to the patient about seeking residential hospice if there is escalating symptom burden in the home setting.  Agree with Baclofen for spasms and for LE cramps.  Continue current mode of care.  Will request TOC assistance.   Code Status/Advance Care Planning: DNR   Symptom Management:     Palliative Prophylaxis:  Delirium Protocol  Additional Recommendations (Limitations, Scope, Preferences): Full Comfort Care  Psycho-social/Spiritual:  Desire for further Chaplaincy support:yes Additional Recommendations: Education on Hospice  Prognosis:  < 4 weeks  Discharge Planning: Home with  Hospice      Primary Diagnoses: Present on Admission:  Acute post-hemorrhagic anemia  Alcohol abuse  Gross hematuria  Bladder mass   I have reviewed the medical record, interviewed the patient and family, and examined the patient. The following aspects are pertinent.  Past Medical History:  Diagnosis Date   Alcohol abuse    CAD (coronary artery disease)    Dyspnea    GERD (gastroesophageal reflux disease)    Myocardial infarction (HCC) 04/16/2003   s/p stent to right coronary artery   Social History   Socioeconomic History   Marital status: Media planner     Spouse name: Not on file   Number of children: Not on file   Years of education: Not on file   Highest education level: Not on file  Occupational History   Not on file  Tobacco Use   Smoking status: Every Day    Types: Pipe   Smokeless tobacco: Not on file  Vaping Use   Vaping status: Never Used  Substance and Sexual Activity   Alcohol use: Yes    Alcohol/week: 56.0 standard drinks of alcohol    Types: 56 Cans of beer per week    Comment: drinks 8 beers every night   Drug use: Not Currently   Sexual activity: Not on file  Other Topics Concern   Not on file  Social History Narrative   Patient was married years ago, now divorced and lives with his girlfriend.   Social Determinants of Health   Financial Resource Strain: Not on file  Food Insecurity: No Food Insecurity (02/26/2023)   Hunger Vital Sign    Worried About Running Out of Food in the Last Year: Never true    Ran Out of Food in the Last Year: Never true  Transportation Needs: No Transportation Needs (02/26/2023)   PRAPARE - Administrator, Civil Service (Medical): No    Lack of Transportation (Non-Medical): No  Physical Activity: Not on file  Stress: Not on file  Social Connections: Not on file   Family History  Problem Relation Age of Onset   Heart disease Other    Scheduled Meds:  sodium chloride   Intravenous Once   Chlorhexidine Gluconate Cloth  6 each Topical Q0600   mupirocin ointment  1 Application Nasal BID   oxybutynin  5 mg Oral TID   Continuous Infusions:  cefTRIAXone (ROCEPHIN)  IV Stopped (02/28/23 0950)   PRN Meds:.acetaminophen **OR** acetaminophen, albuterol, baclofen, diphenhydrAMINE, glycopyrrolate **OR** glycopyrrolate **OR** glycopyrrolate, midazolam, morphine injection, ondansetron **OR** ondansetron (ZOFRAN) IV, mouth rinse, oxyCODONE, polyvinyl alcohol, sorbitol Medications Prior to Admission:  Prior to Admission medications   Medication Sig Start Date End Date Taking?  Authorizing Provider  oxyCODONE (OXY IR/ROXICODONE) 5 MG immediate release tablet Take 5-10 mg by mouth 2 (two) times daily as needed (for pain). 01/21/23  Yes [provider]  TYLENOL 500 MG tablet Take 500-1,000 mg by mouth every 6 (six) hours as needed for mild pain (pain score 1-3) or headache.   Yes [provider]  folic acid (FOLVITE) 1 MG tablet Take 1 tablet (1 mg total) by mouth daily. Patient not taking: Reported on 02/26/2023 09/14/22   Burnadette Pop, MD  polyethylene glycol powder (GLYCOLAX/MIRALAX) 17 GM/SCOOP powder Take 17 g by mouth daily. Patient not taking: Reported on 02/26/2023 09/14/22   Burnadette Pop, MD  potassium chloride SA (KLOR-CON M) 20 MEQ tablet Take 2 tablets (40 mEq total) by mouth daily for 5 days.  Patient not taking: Reported on 02/26/2023 09/14/22 02/26/23  Burnadette Pop, MD  thiamine (VITAMIN B1) 100 MG tablet Take 1 tablet (100 mg total) by mouth daily. Patient not taking: Reported on 02/26/2023 09/14/22   Burnadette Pop, MD   Allergies  Allergen Reactions   Sertraline Hcl Rash and Other (See Comments)    "Zoloft"   Review of Systems +cramps in legs  Physical Exam Appears with generalized weakness Awakens and communicates well Diminished breath sounds Monitor noted Has Foley and CBI  Vital Signs: BP (!) 126/52   Pulse 99   Temp 98.6 F (37 C) (Axillary)   Resp 19   Ht 5\' 8"  (1.727 m)   Wt 95.3 kg   SpO2 98%   BMI 31.95 kg/m  Pain Scale: 0-10   Pain Score: Asleep   SpO2: SpO2: 98 % O2 Device:SpO2: 98 % O2 Flow Rate: .O2 Flow Rate (L/min): 2 L/min  IO: Intake/output summary:  Intake/Output Summary (Last 24 hours) at 02/28/2023 1352 Last data filed at 02/28/2023 1150 Gross per 24 hour  Intake 48890.14 ml  Output 86578 ml  Net -11159.86 ml    LBM: Last BM Date : 02/27/23 Baseline Weight: Weight: 95.3 kg Most recent weight: Weight: 95.3 kg     Palliative Assessment/Data:   PPS 40%  Time In:   12.30 Time  Out:  1330 Time Total:  60  Greater than 50%  of this time was spent counseling and coordinating care related to the above assessment and plan.  Signed by: Rosalin Hawking, MD   Please contact Palliative Medicine Team phone at [phone] for questions and concerns.  For individual provider: See Loretha Stapler

## 2023-02-28 NOTE — Progress Notes (Signed)
PROGRESS NOTE    Noah QUIRIN  WUJ:[id number] DOB: [birth year] DOA: 02/26/2023 PCP: Pcp, No    Brief Narrative:  62 years old male with past medical history of extensive high grade bladder cancer  requiring right neph tube (declined aggressive ca tx, elected for serial scans and palliative care) alcohol use (reportedly about 25drinks/d), CAD who presented to ED 11/13 after his neph tube was removed accidentally when he tripped playing pool several days prior. Underwent urostomy placement with IR 11/13 and admitted to [facility]. Noted to have hematuria at time of admission and hgb 6.3, given 2 PRBC.  On 11/14 was seen by urology for ongoing hematuria.  Patient also started to have periods of shaking like Sz like activity, worse etoh withdrawals and bladder spasms.  Urology scoped the patient at bedside for blood clots. PCCM was consulted in setting of ongoing agitation, and seizure-like  activity.  At this time patient has been transferred out of the ICU after goals of care discussion.  Assessment and plan  Acute encephalopathy EtOH abuse with withdrawal  Sz like activity  Hyponatremia  Extensive high grade bladder cancer Bladder spasms Hematuria status post accidental PCN removal Malfunction of foley catheter  ABLA + chronic anemia   Patient has declined aggressive treatment including surgery and plan is to proceed with palliative level of care.  Was on Precedex for agitation.  Currently on Cipro 0.1 and appears to be more calm.  On CBI for hematuria.  Received PRBC during hospitalization.  Goals of care discussion. Goals of care discussion was done with family by PCCM.  At this time plan is palliative care.  Palliative care has been consulted.     DVT prophylaxis: None   Code Status:     Code Status: Do not attempt resuscitation (DNR) - Comfort care  Disposition: Uncertain at this time likely to residential hospice.  TOC has been consulted  Status is: Inpatient Remains inpatient  appropriate because: Continuous bladder irrigation, possible hospice placement   Family Communication: None at bedside.  Consultants:  PCCM Urology  Procedures:  Foley catheter placement Continuous bladder irrigation Cystoscopy  Antimicrobials:  Rocephin IV  Anti-infectives (From admission, onward)    Start     Dose/Rate Route Frequency Ordered Stop   02/27/23 1000  cefTRIAXone (ROCEPHIN) 2 g in sodium chloride 0.9 % 100 mL IVPB        2 g 200 mL/hr over 30 Minutes Intravenous Every 24 hours 02/27/23 0820         Subjective: Today, patient was seen and examined at bedside.  Patient denies nausea, vomiting, or overt pain.  Sitter at bedside less agitated this morning.  On continuous bladder irrigation.  Objective: Vitals:   02/28/23 0620 02/28/23 0700 02/28/23 0800 02/28/23 1000  BP: (!) 113/57 138/69 (!) 112/54 (!) 141/56  Pulse: 87 (!) 118 92 98  Resp:   13 20  Temp: 98.6 F (37 C)     TempSrc: Axillary     SpO2: 100% 93% 97% 99%  Weight:      Height:        Intake/Output Summary (Last 24 hours) at 02/28/2023 1026 Last data filed at 02/28/2023 1023 Gross per 24 hour  Intake 48890.14 ml  Output 95621 ml  Net -9984.86 ml   Filed Weights   02/27/23 1401  Weight: 95.3 kg    Physical Examination: Body mass index is 31.95 kg/m.   General: Obese built, not in obvious distress, appears weak and deconditioned, Communicative HENT:  Pallor noted. Oral mucosa is moist.  Chest:  Diminished breath sounds bilaterally. No crackles or wheezes.  CVS: S1 &S2 heard. No murmur.  Regular rate and rhythm. Abdomen: Soft, nontender, nondistended.  Bowel sounds are heard.  Foley catheter in place with continuous bladder irrigation. Extremities: No cyanosis, clubbing or edema.  Peripheral pulses are palpable. Psych: Alert, awake medicated, normal mood CNS:  No cranial nerve deficits.  Power equal in all extremities.   Skin: Warm and dry.  Mild erythema of the lower  extremities  Data Reviewed:   CBC: Recent Labs  Lab 02/26/23 1406 02/27/23 1247  WBC 9.4 11.4*  NEUTROABS 7.0  --   HGB 6.3* 7.3*  HCT 23.8* 25.2*  MCV 71.0* 73.9*  PLT 277 250    Basic Metabolic Panel: Recent Labs  Lab 02/26/23 1406 02/27/23 0744  NA 129* 130*  K 4.7 3.6  CL 94* 94*  CO2 24 28  GLUCOSE 96 134*  BUN 14 13  CREATININE 0.82 0.84  CALCIUM 8.0* 7.9*    Liver Function Tests: No results for input(s): "AST", "ALT", "ALKPHOS", "BILITOT", "PROT", "ALBUMIN" in the last 168 hours.   Radiology Studies: IR NEPHROSTOMY PLACEMENT RIGHT  Result Date: 02/27/2023 INDICATION: History of bladder cancer, post image guided placement of right sided nephrostomy catheter on 09/10/2022 with subsequent exchanges on 11/11/2022 and 01/06/2023. The patient's nephrostomy catheter was inadvertently removed at the end of last week. As such the patient presents today for image guided placement of a new nephrostomy catheter for urinary diversion purposes following acquisition of noncontrast CT scan performed earlier same day demonstrating recurrence of right-sided moderate ureterectasis and pelvicaliectasis. EXAM: ULTRASOUND AND FLUOROSCOPIC GUIDED PLACEMENT OF RIGHT NEPHROSTOMY TUBE COMPARISON:  CT abdomen pelvis-earlier same day Fluoroscopic guided exchange of right-sided nephrostomy catheter-01/06/2023; 11/11/2022 Image guided placement of right-sided nephrostomy catheter-09/10/2022 MEDICATIONS: Ciprofloxacin 400 mg IV; The antibiotic was administered in an appropriate time frame prior to skin puncture. ANESTHESIA/SEDATION: Moderate (conscious) sedation was employed during this procedure. A total of Versed 4 mg and Fentanyl 100 mcg was administered intravenously. Moderate Sedation Time: 15 minutes. The patient's level of consciousness and vital signs were monitored continuously by radiology nursing throughout the procedure under my direct supervision. CONTRAST:  10 mL Isovue 300 -  administered into the renal collecting system FLUOROSCOPY TIME:  1 minute, 30 seconds (38 mGy) COMPLICATIONS: None immediate. PROCEDURE: The procedure, risks, benefits, and alternatives were explained to the patient, questions were encouraged and answered and informed consent was obtained. A timeout was performed prior to the initiation of the procedure. The operative site was prepped and draped in the usual sterile fashion and a sterile drape was applied covering the operative field. A sterile gown and sterile gloves were used for the procedure. Local anesthesia was provided with 1% Lidocaine with epinephrine. Attempts were initially made to recannulate the prior nephrostomy catheter track however this proved unsuccessful secondary to complete track healing. As such, ultrasound was used to localize the right kidney. Under direct ultrasound guidance, a 20 gauge needle was advanced into the renal collecting system. An ultrasound image documentation was performed. Access within the collecting system was confirmed with the efflux of urine followed by limited contrast injection. Under intermittent fluoroscopic guidance, an 0.018 wire was advanced into the collecting system and the tract was dilated with an Accustick stent. Next, over a short Amplatz wire, the track was further dilated ultimately allowing placement of a 10-French percutaneous nephrostomy catheter with end coiled and locked within the renal pelvis.  Contrast was injected and several spot fluoroscopic images were obtained in various obliquities. The catheter was secured at the skin entrance site with an interrupted suture and a stat lock device and connected to a gravity bag. Dressings were applied. The patient tolerated procedure well without immediate postprocedural complication. FINDINGS: Complete healing of prior right-sided nephrostomy catheter track. Ultrasound scanning demonstrates a moderately right collecting system. Under a combination of  ultrasound and fluoroscopic guidance, a posterior inferior calix was targeted allowing placement of a 10-French percutaneous nephrostomy catheter with end coiled and locked within the renal pelvis. Contrast injection confirmed appropriate positioning. IMPRESSION: 1. Attempted though unsuccessful recanalization of the prior nephrostomy catheter track. 2. Successful ultrasound and fluoroscopic guided placement of a right sided 10 [place] PCN. PLAN: The patient was found to be significantly anemic prior to the examination and as such was escorted to the emergency department for further evaluation and management following successful and uneventful placement of a new right-sided nephrostomy catheter. Electronically Signed   By: Simonne Come M.D.   On: 02/27/2023 11:13   CT ABDOMEN PELVIS WO CONTRAST  Result Date: 02/27/2023 CLINICAL DATA:  History of bladder cancer with inadvertent removal of right-sided nephrostomy catheter several days ago. Please evaluate for recurrent urinary obstruction prior to potential repeat nephrostomy catheter placement. EXAM: CT ABDOMEN AND PELVIS WITHOUT CONTRAST TECHNIQUE: Multidetector CT imaging of the abdomen and pelvis was performed following the standard protocol without IV contrast. RADIATION DOSE REDUCTION: This exam was performed according to the departmental dose-optimization program which includes automated exposure control, adjustment of the mA and/or kV according to patient size and/or use of iterative reconstruction technique. COMPARISON:  CT abdomen pelvis-09/16/2022; 11/07/2021 Image guided right-sided nephrostomy catheter exchange-01/06/2023 FINDINGS: Lower chest: Limited visualization of the lower thorax demonstrates chronic trace left-sided effusion with associated left basilar subsegmental atelectasis/scarring. Normal heart size. Post median sternotomy. There is diffuse decreased attenuation of the intracardiac blood pool suggestive of anemia. Coronary artery  calcifications and/or coronary stent placement. No pericardial effusion. Hepatobiliary: Normal hepatic contour. Ill-defined punctate gallstones are seen within otherwise normal-appearing gallbladder. No definitive gallbladder wall thickening or pericholecystic stranding. No ascites. Pancreas: Normal noncontrast appearance of the pancreas. Spleen: Normal noncontrast appearance of the spleen. Adrenals/Urinary Tract: Interval removal of right-sided nephrostomy catheter with recurrence of moderate right-sided ureterectasis and pelvicaliectasis secondary to known right bladder wall tumor at the level of the right UVJ measuring at least 5.3 x 2.4 cm (image 74, series 2). There are 2 additional nodular masses involving the left side of the urinary bladder base measuring 2.7 x 1.7 cm (image 72, series 2) and at least 2.7 x 1.8 cm (image 79, series 2), similar to the 10/2021 examination and not resulting in left-sided urinary obstruction. Normal noncontrast appearance of the bilateral adrenal glands. Stomach/Bowel: Moderate-to-large stool burden, particularly within the rectal vault, without evidence of enteric obstruction. Scattered colonic diverticulosis without evidence superimposed acute diverticulitis. Normal noncontrast appearance of the terminal ileum and the retrocecal appendix. No significant hiatal hernia. No pneumoperitoneum, pneumatosis or portal venous gas. Vascular/Lymphatic: Atherosclerotic plaque within a normal caliber abdominal aorta. No bulky retroperitoneal, mesenteric, pelvic or inguinal lymphadenopathy on this noncontrast examination. Reproductive: Normal appearance of the prostate gland. No free fluid in the pelvic cul-de-sac Other: Subcutaneous edema about the midline of the low back. Musculoskeletal: No acute or aggressive osseous abnormalities. Mild degenerative change of the bilateral hips with joint space loss, subchondral sclerosis and osteophytosis. 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 IMPRESSION: 1. Interval removal of right-sided nephrostomy catheter with recurrence of moderate right-sided ureterectasis and pelvicaliectasis secondary to known right bladder wall tumor at the level of the right UVJ measuring at least 5.3 x 2.4 cm. 2. Two additional nodular masses involving the left side of the urinary bladder base measuring 2.7 x 1.7 cm and at least 2.7 x 1.8 cm, similar to the 10/2021 examination and not resulting in left-sided urinary obstruction. 3. Cholelithiasis without evidence of acute cholecystitis. 4. Scattered colonic diverticulosis without evidence of superimposed acute diverticulitis. 5. Aortic Atherosclerosis (ICD10-I70.0). PLAN: Patient subsequently went on to undergo right-sided nephrostomy catheter placement. Electronically Signed   By: Simonne Come M.D.   On: 02/27/2023 11:10      LOS: 2 days    Noah Das, MD [facility] Available via Epic secure chat 7am-7pm After these hours, please refer to coverage provider listed on [email or web address] 02/28/2023, 10:26 AM

## 2023-02-28 NOTE — Progress Notes (Signed)
   This pt has been referred to hospice services for home hospice. I have reviewed chart and reached out to next of kin brother Rosanne Ashing. I have left VM for return call to schedule good time to visit and discuss hospice care.   Norm Parcel RN [phone]

## 2023-02-28 NOTE — Plan of Care (Signed)
  Problem: Education: Goal: Knowledge of General Education information will improve Description: Including pain rating scale, medication(s)/side effects and non-pharmacologic comfort measures Outcome: Progressing   Problem: Clinical Measurements: Goal: Ability to maintain clinical measurements within normal limits will improve Outcome: Progressing Goal: Will remain free from infection Outcome: Progressing Goal: Respiratory complications will improve Outcome: Progressing   Problem: Elimination: Goal: Will not experience complications related to bowel motility Outcome: Progressing   Problem: Safety: Goal: Ability to remain free from injury will improve Outcome: Progressing   Problem: Skin Integrity: Goal: Risk for impaired skin integrity will decrease Outcome: Progressing   Problem: Safety: Goal: Non-violent Restraint(s) Outcome: Progressing

## 2023-02-28 NOTE — Care Management Important Message (Signed)
Important Message  Patient Details  Name: Noah Rosales MRN: [id number] Date of Birth: [birth year]   Important Message Given:  No (unable to reach by phone, copy mailed to address on file)     Corey Harold 02/28/2023, 4:16 PM

## 2023-02-28 NOTE — TOC Progression Note (Addendum)
Transition of Care [facility]) - Progression Note    Patient Details  Name: SLADEN HURD MRN: [id number] Date of Birth: February 02, 1961  [facility] CM/SW Contact  Lavenia Atlas, RN Phone Number: 02/28/2023, 2:20 PM  Clinical Narrative:   Family has chosen home hospice with [facility]. Notified Cheri with [facility] who is following.  - 4:20pm Notified Cheri w/[facility] who has not spoke with family yet. This RNCM spoke with patient and family at bedside. Patient's brother Rosanne Ashing has stepped out of room to make a phone call.    TOC following for discharge needs     Expected Discharge Plan: Home w Hospice Care Barriers to Discharge: Continued Medical Work up  Expected Discharge Plan and Services In-house Referral: NA Discharge Planning Services: CM Consult Post Acute Care Choice: Hospice Living arrangements for the past 2 months: Mobile Home                 DME Arranged: N/A DME Agency: NA       HH Arranged:  (home hospice) HH Agency: [facility] Date HH Agency Contacted: 02/28/23 Time HH Agency Contacted: 1400 Representative spoke with at [facility] Agency: Cheri   Social Determinants of Health (SDOH) Interventions SDOH Screenings   Food Insecurity: No Food Insecurity (02/26/2023)  Housing: Low Risk  (02/26/2023)  Transportation Needs: No Transportation Needs (02/26/2023)  Utilities: Not At Risk (02/26/2023)  Tobacco Use: High Risk (02/26/2023)    Readmission Risk Interventions    02/27/2023    5:07 PM  Readmission Risk Prevention Plan  Transportation Screening Complete  PCP or Specialist Appt within 3-5 Days Complete  HRI or Home Care Consult Complete  Social Work Consult for Recovery Care Planning/Counseling Complete  Palliative Care Screening Complete  Medication Review [employer]) Complete

## 2023-03-01 DIAGNOSIS — N3289 Other specified disorders of bladder: Secondary | ICD-10-CM | POA: Diagnosis not present

## 2023-03-01 DIAGNOSIS — D62 Acute posthemorrhagic anemia: Secondary | ICD-10-CM | POA: Diagnosis not present

## 2023-03-01 DIAGNOSIS — R310 Gross hematuria: Secondary | ICD-10-CM | POA: Diagnosis not present

## 2023-03-01 DIAGNOSIS — F1010 Alcohol abuse, uncomplicated: Secondary | ICD-10-CM | POA: Diagnosis not present

## 2023-03-01 MED ORDER — SODIUM CHLORIDE 0.9 % IR SOLN
3000.0000 mL | Status: DC
  Administered 2023-03-01 – 2023-03-02 (×6): 3000 mL

## 2023-03-01 NOTE — Progress Notes (Signed)
PROGRESS NOTE    Noah Rosales  DGU:[id number] DOB: 04-02-1961 DOA: 02/26/2023 PCP: Pcp, No    Brief Narrative:   62 years old male with past medical history of extensive high grade bladder cancer  requiring right neph tube (declined aggressive ca tx, elected for serial scans and palliative care) alcohol use (reportedly about 25drinks/d), CAD who presented to ED 11/13 after his neph tube was removed accidentally when he tripped playing pool several days prior. Underwent urostomy placement with IR 11/13 and admitted to [facility]. Noted to have hematuria at time of admission and hgb 6.3, given 2 PRBC.  On 11/14, was seen by urology for ongoing hematuria.  Patient also started to have periods of shaking like Sz like activity, worse etoh withdrawals and bladder spasms.  Urology scoped the patient at bedside for blood clots. PCCM was consulted in setting of ongoing agitation, and seizure-like  activity.  At this time patient has been transferred out of the ICU after goals of care discussion.  Assessment and plan  Acute encephalopathy EtOH abuse with withdrawal  Sz like activity  Hyponatremia  Extensive high grade bladder cancer Bladder spasms Hematuria status post accidental PCN removal Malfunction of foley catheter  ABLA + chronic anemia   Patient has declined aggressive treatment including surgery and plan is to proceed with palliative level of care.  Was on Precedex for agitation.  Currently appears to be more calm.  On CBI for hematuria.  Received PRBC during hospitalization.  Urology following the patient at this time.  Recommend continuation of CBI.  Lower extremity cramps and spasms.  Continue baclofen.  Goals of care discussion. Goals of care discussion was done with family by PCCM.  At this time plan discharge home with hospice.    DVT prophylaxis: None   Code Status:     Code Status: Do not attempt resuscitation (DNR) - Comfort care  Disposition: Likely home with hospice in 1  to 2 days  Status is: Inpatient Remains inpatient appropriate because: Continuous bladder irrigation, plan for home with hospice   Family Communication: None at bedside.  Consultants:  PCCM Urology  Procedures:  Foley catheter placement Continuous bladder irrigation Cystoscopy  Antimicrobials:  Rocephin IV  Anti-infectives (From admission, onward)    Start     Dose/Rate Route Frequency Ordered Stop   02/27/23 1000  cefTRIAXone (ROCEPHIN) 2 g in sodium chloride 0.9 % 100 mL IVPB        2 g 200 mL/hr over 30 Minutes Intravenous Every 24 hours 02/27/23 0820         Subjective:  Today, patient was seen and examined at bedside.  No interval complaints reported has some nausea but no overt pain.  On continuous bladder irrigation.  Seen by urology.  Complains of mild lower extremity cramps.  Objective: Vitals:   03/01/23 0700 03/01/23 0800 03/01/23 0806 03/01/23 0900  BP:  (!) 104/44    Pulse: 78 82  77  Resp: 12 17    Temp:   98.5 F (36.9 C)   TempSrc:   Oral   SpO2: 98% 100%  100%  Weight:      Height:        Intake/Output Summary (Last 24 hours) at 03/01/2023 1001 Last data filed at 03/01/2023 0900 Gross per 24 hour  Intake 29857.84 ml  Output 95638 ml  Net -792.16 ml   Filed Weights   02/27/23 1401  Weight: 95.3 kg    Physical Examination: Body mass index is 31.95  kg/m.   General: Obese built, not in obvious distress, appears weak and deconditioned, HENT:   Pallor noted. Oral mucosa is moist.  Chest:  Diminished breath sounds bilaterally. No crackles or wheezes.  CVS: S1 &S2 heard. No murmur.  Regular rate and rhythm. Abdomen: Soft, nontender, nondistended.  Bowel sounds are heard.  Foley catheter in place with continuous bladder irrigation.  Hematuria noted. Extremities: No cyanosis, clubbing or edema.  Peripheral pulses are palpable. Psych: Alert, awake  normal mood CNS:  No cranial nerve deficits.  Moves extremities, occasional spasms over the  lower extremities. Skin: Warm and dry.  Mild erythema of the lower extremities  Data Reviewed:   CBC: Recent Labs  Lab 02/26/23 1406 02/27/23 1247  WBC 9.4 11.4*  NEUTROABS 7.0  --   HGB 6.3* 7.3*  HCT 23.8* 25.2*  MCV 71.0* 73.9*  PLT 277 250    Basic Metabolic Panel: Recent Labs  Lab 02/26/23 1406 02/27/23 0744  NA 129* 130*  K 4.7 3.6  CL 94* 94*  CO2 24 28  GLUCOSE 96 134*  BUN 14 13  CREATININE 0.82 0.84  CALCIUM 8.0* 7.9*    Liver Function Tests: No results for input(s): "AST", "ALT", "ALKPHOS", "BILITOT", "PROT", "ALBUMIN" in the last 168 hours.   Radiology Studies: No results found.    LOS: 3 days    Joycelyn Das, MD [facility] Available via Epic secure chat 7am-7pm After these hours, please refer to coverage provider listed on [email or web address] 03/01/2023, 10:01 AM

## 2023-03-01 NOTE — Progress Notes (Signed)
PMT no note Chart reviewed Note that the [facility] has reached out to [facility] liaison Recommend discharge home with hospice services with [facility] [place] as per goals of care discussions with patient, significant other and brother at the time of initial palliative consultation on 02-28-2023.  No new inpatient PMT specific recommendations at this time. No charge Rosalin Hawking, MD [place] palliative

## 2023-03-01 NOTE — Progress Notes (Signed)
Urology Inpatient Progress Note  Subjective: Uneventful night.  Patient comfortable. Urine clear to light pink on low rate CBI.   Anti-infectives: Anti-infectives (From admission, onward)    Start     Dose/Rate Route Frequency Ordered Stop   02/27/23 1000  cefTRIAXone (ROCEPHIN) 2 g in sodium chloride 0.9 % 100 mL IVPB        2 g 200 mL/hr over 30 Minutes Intravenous Every 24 hours 02/27/23 0820         Current Facility-Administered Medications  Medication Dose Route Frequency Provider Last Rate Last Admin   0.9 %  sodium chloride infusion (Manually program via Guardrails IV Fluids)   Intravenous Once [name], Belkys A, MD   Stopping previously hung infusion at 02/27/23 2309   acetaminophen (TYLENOL) tablet 650 mg  650 mg Oral Q6H PRN Lanier Clam, NP   650 mg at 02/28/23 2118   Or   acetaminophen (TYLENOL) suppository 650 mg  650 mg Rectal Q6H PRN Bowser, Kaylyn Layer, NP       albuterol (PROVENTIL) (2.5 MG/3ML) 0.083% nebulizer solution 2.5 mg  2.5 mg Nebulization Q4H PRN [name], Belkys A, MD   2.5 mg at 02/27/23 1115   baclofen (LIORESAL) tablet 5 mg  5 mg Oral TID PRN Lanier Clam, NP   5 mg at 02/28/23 0917   cefTRIAXone (ROCEPHIN) 2 g in sodium chloride 0.9 % 100 mL IVPB  2 g Intravenous Q24H [name], Belkys A, MD   Stopped at 02/28/23 0950   Chlorhexidine Gluconate Cloth 2 % PADS 6 each  6 each Topical Q0600 Wyline Mood, NP   6 each at 02/28/23 2146   diphenhydrAMINE (BENADRYL) injection 25 mg  25 mg Intravenous Q4H PRN Bowser, Kaylyn Layer, NP       glycopyrrolate (ROBINUL) tablet 1 mg  1 mg Oral Q4H PRN Bowser, Kaylyn Layer, NP       Or   glycopyrrolate (ROBINUL) injection 0.2 mg  0.2 mg Subcutaneous Q4H PRN Bowser, Kaylyn Layer, NP       Or   glycopyrrolate (ROBINUL) injection 0.2 mg  0.2 mg Intravenous Q4H PRN Bowser, Kaylyn Layer, NP       midazolam (VERSED) injection 2-4 mg  2-4 mg Intravenous Q4H PRN Lanier Clam, NP   2 mg at 02/28/23 2122   morphine (PF) 2 MG/ML  injection 2-4 mg  2-4 mg Intravenous Q30 min PRN Lanier Clam, NP   2 mg at 02/28/23 1417   mupirocin ointment (BACTROBAN) 2 % 1 Application  1 Application Nasal BID Lorin Glass, MD   1 Application at 02/28/23 2137   ondansetron (ZOFRAN) tablet 4 mg  4 mg Oral Q6H PRN [name], Belkys A, MD       Or   ondansetron (ZOFRAN) injection 4 mg  4 mg Intravenous Q6H PRN [name], Belkys A, MD       Oral care mouth rinse  15 mL Mouth Rinse PRN Lorin Glass, MD       oxybutynin (DITROPAN) tablet 5 mg  5 mg Oral TID [name], Belkys A, MD   5 mg at 02/28/23 2118   oxyCODONE (Oxy IR/ROXICODONE) immediate release tablet 5 mg  5 mg Oral Q6H PRN [name], Belkys A, MD   5 mg at 02/28/23 2325   polyvinyl alcohol (LIQUIFILM TEARS) 1.4 % ophthalmic solution 1 drop  1 drop Both Eyes QID PRN Bowser, Kaylyn Layer, NP       sodium chloride irrigation 0.9 %  3,000 mL  3,000 mL Irrigation Continuous Pokhrel, Laxman, MD       sorbitol 70 % solution 30 mL  30 mL Oral Daily PRN [name], Belkys A, MD         Objective: Vital signs in last 24 hours: Temp:  [97.8 F (36.6 C)-101 F (38.3 C)] 98.5 F (36.9 C) (11/16 0806) Pulse Rate:  [3-106] 77 (11/16 0900) Resp:  [12-26] 17 (11/16 0800) BP: (86-141)/(36-64) 104/44 (11/16 0800) SpO2:  [91 %-100 %] 100 % (11/16 0900)  Intake/Output from previous day: 11/15 0701 - 11/16 0700 In: 11914 [P.O.:920; I.V.:94.9; IV Piggyback:98.1] Out: 78295 [Urine:34250] Intake/Output this shift: Total I/O In: -  Out: 2200 [Urine:2200]  GENERAL APPEARANCE:  Well appearing, well developed, well nourished, NAD HEENT:  Atraumatic, normocephalic, oropharynx clear NECK:  Supple without lymphadenopathy or thyromegaly ABDOMEN:  Soft, non-tender, no masses EXTREMITIES:  Moves all extremities well, without clubbing, cyanosis, or edema NEUROLOGIC:  Alert and oriented x 3, normal gait, CN II-XII grossly intact MENTAL STATUS:  appropriate BACK:  Non-tender to palpation, No CVAT SKIN:   Warm, dry, and intact   Lab Results:  Recent Labs    02/26/23 1406 02/27/23 1247  WBC 9.4 11.4*  HGB 6.3* 7.3*  HCT 23.8* 25.2*  PLT 277 250   BMET Recent Labs    02/26/23 1406 02/27/23 0744  NA 129* 130*  K 4.7 3.6  CL 94* 94*  CO2 24 28  GLUCOSE 96 134*  BUN 14 13  CREATININE 0.82 0.84  CALCIUM 8.0* 7.9*   PT/INR Recent Labs    02/26/23 1423 02/27/23 1020  LABPROT 21.9* 15.4*  INR 1.9* 1.2   ABG No results for input(s): "PHART", "HCO3" in the last 72 hours.  Invalid input(s): "PCO2", "PO2"  Studies/Results: No results found.   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 Continue with hand irrigation and CBI as needed for comfort measures. Under no circumstance does the patient want to proceed with any further surgery per his wishes.    Joline Maxcy, MD 03/01/2023

## 2023-03-01 NOTE — Plan of Care (Signed)
  Problem: Education: Goal: Knowledge of General Education information will improve Description: Including pain rating scale, medication(s)/side effects and non-pharmacologic comfort measures Outcome: Progressing   Problem: Health Behavior/Discharge Planning: Goal: Ability to manage health-related needs will improve Outcome: Progressing   Problem: Clinical Measurements: Goal: Ability to maintain clinical measurements within normal limits will improve Outcome: Progressing Goal: Will remain free from infection Outcome: Progressing Goal: Diagnostic test results will improve Outcome: Progressing Goal: Respiratory complications will improve Outcome: Progressing Goal: Cardiovascular complication will be avoided Outcome: Progressing   Problem: Activity: Goal: Risk for activity intolerance will decrease Outcome: Progressing   Problem: Nutrition: Goal: Adequate nutrition will be maintained Outcome: Progressing   Problem: Coping: Goal: Level of anxiety will decrease Outcome: Progressing   Problem: Elimination: Goal: Will not experience complications related to bowel motility Outcome: Progressing Goal: Will not experience complications related to urinary retention Outcome: Progressing   Problem: Pain Management: Goal: General experience of comfort will improve Outcome: Progressing   Problem: Safety: Goal: Ability to remain free from injury will improve Outcome: Progressing   Problem: Skin Integrity: Goal: Risk for impaired skin integrity will decrease Outcome: Progressing   Problem: Safety: Goal: Non-violent Restraint(s) Outcome: Completed/Met   Cindy S. Clelia Croft BSN, RN, CCRP, CCRN 03/01/2023 12:15 AM

## 2023-03-02 DIAGNOSIS — F1010 Alcohol abuse, uncomplicated: Secondary | ICD-10-CM | POA: Diagnosis not present

## 2023-03-02 DIAGNOSIS — N3289 Other specified disorders of bladder: Secondary | ICD-10-CM | POA: Diagnosis not present

## 2023-03-02 DIAGNOSIS — R310 Gross hematuria: Secondary | ICD-10-CM | POA: Diagnosis not present

## 2023-03-02 DIAGNOSIS — D62 Acute posthemorrhagic anemia: Secondary | ICD-10-CM | POA: Diagnosis not present

## 2023-03-02 LAB — TYPE AND SCREEN
ABO/RH(D): O NEG
Antibody Screen: NEGATIVE
Unit division: 0
Unit division: 0
Unit division: 0

## 2023-03-02 LAB — BPAM RBC
Blood Product Expiration Date: 202412102359
Blood Product Expiration Date: 202412112359
Blood Product Expiration Date: 202412112359
ISSUE DATE / TIME: 202411140300
ISSUE DATE / TIME: 202411132247
Unit Type and Rh: 9500
Unit Type and Rh: 9500
Unit Type and Rh: 9500

## 2023-03-02 MED ORDER — BACLOFEN 5 MG PO TABS: 5.0000 mg | ORAL_TABLET | Freq: Three times a day (TID) | ORAL | 0 refills | Status: AC | PRN

## 2023-03-02 MED ORDER — OXYBUTYNIN CHLORIDE 5 MG PO TABS: 5.0000 mg | ORAL_TABLET | Freq: Three times a day (TID) | ORAL | 0 refills | Status: AC

## 2023-03-02 MED ORDER — ALBUTEROL SULFATE (2.5 MG/3ML) 0.083% IN NEBU: 2.5000 mg | INHALATION_SOLUTION | Freq: Four times a day (QID) | RESPIRATORY_TRACT | 12 refills | Status: DC | PRN

## 2023-03-02 MED ORDER — POLYVINYL ALCOHOL 1.4 % OP SOLN: 1.0000 [drp] | Freq: Four times a day (QID) | OPHTHALMIC | 0 refills | Status: DC | PRN

## 2023-03-02 MED ORDER — ONDANSETRON HCL 4 MG PO TABS: 4.0000 mg | ORAL_TABLET | Freq: Four times a day (QID) | ORAL | 0 refills | Status: AC | PRN

## 2023-03-02 NOTE — Progress Notes (Signed)
Discharge instructions given to patient. Nephrostomy and foley catheter left in patient per MD orders. Peripheral IV removed. CBI discontinued. Patient belongings given back to patient. Patient brought to main entrance to leave.

## 2023-03-02 NOTE — Plan of Care (Signed)
  Problem: Education: Goal: Knowledge of General Education information will improve Description: Including pain rating scale, medication(s)/side effects and non-pharmacologic comfort measures Outcome: Progressing   Problem: Health Behavior/Discharge Planning: Goal: Ability to manage health-related needs will improve Outcome: Progressing   Problem: Pain Management: Goal: General experience of comfort will improve Outcome: Progressing

## 2023-03-02 NOTE — Discharge Summary (Signed)
Physician Discharge Summary  Noah Rosales RJJ:[id number] DOB: 25-Mar-1961 DOA: 02/26/2023  PCP: Oneita Hurt, No  Admit date: 02/26/2023 Discharge date: 03/02/2023  Admitted From: Home  Discharge disposition: Home with hospice  Recommendations for Outpatient Follow-Up:   Follow up with hospice care provider at home.  Discharge Diagnosis:   Principal Problem:   Acute post-hemorrhagic anemia Active Problems:   Gross hematuria   Bladder mass   Alcohol abuse   Encephalopathy acute   Malignant neoplasm of urinary bladder (HCC)   Pressure injury of skin   Discharge Condition: Improved.  Diet recommendation:   Regular.  Wound care: None.  Code status: DNR   History of Present Illness:   62 years old male with past medical history of extensive high grade bladder cancer  requiring right neph tube (declined aggressive ca tx, elected for serial scans and palliative care) alcohol use (reportedly about 25drinks/d), CAD who presented to ED 11/13 after his neph tube was removed accidentally when he tripped playing pool several days prior. Underwent urostomy placement with IR 11/13 and admitted to [facility]. Noted to have hematuria at time of admission and hgb 6.3, given 2 PRBC.  On 11/14, was seen by urology for ongoing hematuria.  Patient also started to have periods of shaking like Sz like activity, worse etoh withdrawals and bladder spasms.  Urology scoped the patient at bedside for blood clots. PCCM was consulted in setting of ongoing agitation, and seizure-like  activity.  Subsequently, after goals of care discussion patient was transition to hospice level of care.  Hospital Course:   Following conditions were addressed during hospitalization as listed below,  Acute encephalopathy EtOH abuse with withdrawal  Sz like activity  Hyponatremia  Extensive high grade bladder cancer Bladder spasms Hematuria status post accidental PCN removal Malfunction of foley catheter  ABLA + chronic  anemia    Patient has declined aggressive treatment including surgery and plan is to proceed with palliative level of care.  Was initially on Precedex for agitation.  Currently appears to be more calm.  Patient received CBI for hematuria and also.  Received PRBC during hospitalization.  Urology followed the patient during hospitalization and recommend to continue Foley catheter on discharge.  At this time plan is to transition to hospice care at home.   Lower extremity cramps and spasms.  Continue baclofen on discharge.   Goals of care discussion. Goals of care discussion was done with family by PCCM.  At this time plan discharge home with hospice.  Disposition.  At this time, patient is stable for disposition home with hospice services  Medical Consultants:   PCCM Urology  Procedures:    Cystoscopy, CBI, Foley catheter placement Subjective:   Today, patient was seen and examined at bedside.  Wishes to go home.  Denies any nausea vomiting fever chills or rigor.  Wishes to go home with hospice and does not wish clarification.  Discharge Exam:   Vitals:   03/02/23 1000 03/02/23 1100  BP: (!) 125/46   Pulse: 92 85  Resp: 20 19  Temp:    SpO2: 95% 96%   Vitals:   03/02/23 0800 03/02/23 0900 03/02/23 1000 03/02/23 1100  BP: (!) 135/46  (!) 125/46   Pulse: 78 86 92 85  Resp: 19 18 20 19   Temp:      TempSrc:      SpO2: 95% 96% 95% 96%  Weight:      Height:       Body mass index is  31.95 kg/m.   General: Alert awake, not in obvious distress, obese built, weak and deconditioned HENT: pupils equally reacting to light, pallor noted oral mucosa is moist.  Chest:  Clear breath sounds.  Diminished breath sounds bilaterally. No crackles or wheezes.  CVS: S1 &S2 heard. No murmur.  Regular rate and rhythm. Abdomen: Soft, nontender, nondistended.  Bowel sounds are heard.  Foley catheter in place Extremities: No cyanosis, clubbing or edema.  Peripheral pulses are palpable. Psych:  Alert, awake and oriented, normal mood CNS:  No cranial nerve deficits.  Power equal in all extremities.   Skin: Warm and dry.  Mild erythema of the lower extremities.  The results of significant diagnostics from this hospitalization (including imaging, microbiology, ancillary and laboratory) are listed below for reference.     Diagnostic Studies:   IR NEPHROSTOMY PLACEMENT RIGHT  Result Date: 02/27/2023 INDICATION: History of bladder cancer, post image guided placement of right sided nephrostomy catheter on 09/10/2022 with subsequent exchanges on 11/11/2022 and 01/06/2023. The patient's nephrostomy catheter was inadvertently removed at the end of last week. As such the patient presents today for image guided placement of a new nephrostomy catheter for urinary diversion purposes following acquisition of noncontrast CT scan performed earlier same day demonstrating recurrence of right-sided moderate ureterectasis and pelvicaliectasis. EXAM: ULTRASOUND AND FLUOROSCOPIC GUIDED PLACEMENT OF RIGHT NEPHROSTOMY TUBE COMPARISON:  CT abdomen pelvis-earlier same day Fluoroscopic guided exchange of right-sided nephrostomy catheter-01/06/2023; 11/11/2022 Image guided placement of right-sided nephrostomy catheter-09/10/2022 MEDICATIONS: Ciprofloxacin 400 mg IV; The antibiotic was administered in an appropriate time frame prior to skin puncture. ANESTHESIA/SEDATION: Moderate (conscious) sedation was employed during this procedure. A total of Versed 4 mg and Fentanyl 100 mcg was administered intravenously. Moderate Sedation Time: 15 minutes. The patient's level of consciousness and vital signs were monitored continuously by radiology nursing throughout the procedure under my direct supervision. CONTRAST:  10 mL Isovue 300 - administered into the renal collecting system FLUOROSCOPY TIME:  1 minute, 30 seconds (38 mGy) COMPLICATIONS: None immediate. PROCEDURE: The procedure, risks, benefits, and alternatives were  explained to the patient, questions were encouraged and answered and informed consent was obtained. A timeout was performed prior to the initiation of the procedure. The operative site was prepped and draped in the usual sterile fashion and a sterile drape was applied covering the operative field. A sterile gown and sterile gloves were used for the procedure. Local anesthesia was provided with 1% Lidocaine with epinephrine. Attempts were initially made to recannulate the prior nephrostomy catheter track however this proved unsuccessful secondary to complete track healing. As such, ultrasound was used to localize the right kidney. Under direct ultrasound guidance, a 20 gauge needle was advanced into the renal collecting system. An ultrasound image documentation was performed. Access within the collecting system was confirmed with the efflux of urine followed by limited contrast injection. Under intermittent fluoroscopic guidance, an 0.018 wire was advanced into the collecting system and the tract was dilated with an Accustick stent. Next, over a short Amplatz wire, the track was further dilated ultimately allowing placement of a 10-French percutaneous nephrostomy catheter with end coiled and locked within the renal pelvis. Contrast was injected and several spot fluoroscopic images were obtained in various obliquities. The catheter was secured at the skin entrance site with an interrupted suture and a stat lock device and connected to a gravity bag. Dressings were applied. The patient tolerated procedure well without immediate postprocedural complication. FINDINGS: Complete healing of prior right-sided nephrostomy catheter track. Ultrasound scanning  demonstrates a moderately right collecting system. Under a combination of ultrasound and fluoroscopic guidance, a posterior inferior calix was targeted allowing placement of a 10-French percutaneous nephrostomy catheter with end coiled and locked within the renal pelvis.  Contrast injection confirmed appropriate positioning. IMPRESSION: 1. Attempted though unsuccessful recanalization of the prior nephrostomy catheter track. 2. Successful ultrasound and fluoroscopic guided placement of a right sided 10 [place] PCN. PLAN: The patient was found to be significantly anemic prior to the examination and as such was escorted to the emergency department for further evaluation and management following successful and uneventful placement of a new right-sided nephrostomy catheter. Electronically Signed   By: Simonne Come M.D.   On: 02/27/2023 11:13   CT ABDOMEN PELVIS WO CONTRAST  Result Date: 02/27/2023 CLINICAL DATA:  History of bladder cancer with inadvertent removal of right-sided nephrostomy catheter several days ago. Please evaluate for recurrent urinary obstruction prior to potential repeat nephrostomy catheter placement. EXAM: CT ABDOMEN AND PELVIS WITHOUT CONTRAST TECHNIQUE: Multidetector CT imaging of the abdomen and pelvis was performed following the standard protocol without IV contrast. RADIATION DOSE REDUCTION: This exam was performed according to the departmental dose-optimization program which includes automated exposure control, adjustment of the mA and/or kV according to patient size and/or use of iterative reconstruction technique. COMPARISON:  CT abdomen pelvis-09/16/2022; 11/07/2021 Image guided right-sided nephrostomy catheter exchange-01/06/2023 FINDINGS: Lower chest: Limited visualization of the lower thorax demonstrates chronic trace left-sided effusion with associated left basilar subsegmental atelectasis/scarring. Normal heart size. Post median sternotomy. There is diffuse decreased attenuation of the intracardiac blood pool suggestive of anemia. Coronary artery calcifications and/or coronary stent placement. No pericardial effusion. Hepatobiliary: Normal hepatic contour. Ill-defined punctate gallstones are seen within otherwise normal-appearing gallbladder. No  definitive gallbladder wall thickening or pericholecystic stranding. No ascites. Pancreas: Normal noncontrast appearance of the pancreas. Spleen: Normal noncontrast appearance of the spleen. Adrenals/Urinary Tract: Interval removal of right-sided nephrostomy catheter with recurrence of moderate right-sided ureterectasis and pelvicaliectasis secondary to known right bladder wall tumor at the level of the right UVJ measuring at least 5.3 x 2.4 cm (image 74, series 2). There are 2 additional nodular masses involving the left side of the urinary bladder base measuring 2.7 x 1.7 cm (image 72, series 2) and at least 2.7 x 1.8 cm (image 79, series 2), similar to the 10/2021 examination and not resulting in left-sided urinary obstruction. Normal noncontrast appearance of the bilateral adrenal glands. Stomach/Bowel: Moderate-to-large stool burden, particularly within the rectal vault, without evidence of enteric obstruction. Scattered colonic diverticulosis without evidence superimposed acute diverticulitis. Normal noncontrast appearance of the terminal ileum and the retrocecal appendix. No significant hiatal hernia. No pneumoperitoneum, pneumatosis or portal venous gas. Vascular/Lymphatic: Atherosclerotic plaque within a normal caliber abdominal aorta. No bulky retroperitoneal, mesenteric, pelvic or inguinal lymphadenopathy on this noncontrast examination. Reproductive: Normal appearance of the prostate gland. No free fluid in the pelvic cul-de-sac Other: Subcutaneous edema about the midline of the low back. Musculoskeletal: No acute or aggressive osseous abnormalities. Mild degenerative change of the bilateral hips with joint space loss, subchondral sclerosis and osteophytosis. 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 IMPRESSION: 1. Interval removal of right-sided nephrostomy catheter with recurrence of moderate right-sided ureterectasis and pelvicaliectasis secondary to known right  bladder wall tumor at the level of the right UVJ measuring at least 5.3 x 2.4 cm. 2. Two additional nodular masses involving the left side of the urinary bladder base measuring 2.7 x 1.7 cm and at least  2.7 x 1.8 cm, similar to the 10/2021 examination and not resulting in left-sided urinary obstruction. 3. Cholelithiasis without evidence of acute cholecystitis. 4. Scattered colonic diverticulosis without evidence of superimposed acute diverticulitis. 5. Aortic Atherosclerosis (ICD10-I70.0). PLAN: Patient subsequently went on to undergo right-sided nephrostomy catheter placement. Electronically Signed   By: Simonne Come M.D.   On: 02/27/2023 11:10     Labs:   Basic Metabolic Panel: Recent Labs  Lab 02/26/23 1406 02/27/23 0744  NA 129* 130*  K 4.7 3.6  CL 94* 94*  CO2 24 28  GLUCOSE 96 134*  BUN 14 13  CREATININE 0.82 0.84  CALCIUM 8.0* 7.9*   GFR Estimated Creatinine Clearance: 102.1 mL/min (by C-G formula based on SCr of 0.84 mg/dL). Liver Function Tests: No results for input(s): "AST", "ALT", "ALKPHOS", "BILITOT", "PROT", "ALBUMIN" in the last 168 hours. No results for input(s): "LIPASE", "AMYLASE" in the last 168 hours. No results for input(s): "AMMONIA" in the last 168 hours. Coagulation profile Recent Labs  Lab 02/26/23 1423 02/27/23 1020  INR 1.9* 1.2    CBC: Recent Labs  Lab 02/26/23 1406 02/27/23 1247  WBC 9.4 11.4*  NEUTROABS 7.0  --   HGB 6.3* 7.3*  HCT 23.8* 25.2*  MCV 71.0* 73.9*  PLT 277 250   Cardiac Enzymes: No results for input(s): "CKTOTAL", "CKMB", "CKMBINDEX", "TROPONINI" in the last 168 hours. BNP: Invalid input(s): "POCBNP" CBG: Recent Labs  Lab 02/27/23 1948  GLUCAP 128*   D-Dimer No results for input(s): "DDIMER" in the last 72 hours. Hgb A1c No results for input(s): "HGBA1C" in the last 72 hours. Lipid Profile No results for input(s): "CHOL", "HDL", "LDLCALC", "TRIG", "CHOLHDL", "LDLDIRECT" in the last 72 hours. Thyroid function  studies No results for input(s): "TSH", "T4TOTAL", "T3FREE", "THYROIDAB" in the last 72 hours.  Invalid input(s): "FREET3" Anemia work up No results for input(s): "VITAMINB12", "FOLATE", "FERRITIN", "TIBC", "IRON", "RETICCTPCT" in the last 72 hours. Microbiology Recent Results (from the past 240 hour(s))  Urine Culture     Status: Abnormal   Collection Time: 02/26/23  7:19 PM   Specimen: Urine, Random  Result Value Ref Range Status   Specimen Description   Final    URINE, RANDOM Performed at [facility], [address] [place], [place] [address]    Special Requests   Final    NONE Reflexed from [id number] Performed at [facility], [address], [place], [place] [address]    Culture MULTIPLE SPECIES PRESENT, SUGGEST RECOLLECTION (A)  Final   Report Status 02/28/2023 FINAL  Final  MRSA Next Gen by PCR, Nasal     Status: Abnormal   Collection Time: 02/27/23  2:26 PM   Specimen: Nasal Mucosa; Nasal Swab  Result Value Ref Range Status   MRSA by PCR Next Gen DETECTED (A) NOT DETECTED Final    Comment: RESULT CALLED TO, READ BACK BY AND VERIFIED WITH: NEALSON, T RN @ 2035 ON 02/27/2023 BY MTA (NOTE) The GeneXpert MRSA Assay (FDA approved for NASAL specimens only), is one component of a comprehensive MRSA colonization surveillance program. It is not intended to diagnose MRSA infection nor to guide or monitor treatment for MRSA infections. Test performance is not FDA approved in patients less than 55 years old. Performed at [facility], [address], [place], [place] [address]      Discharge Instructions:   Discharge Instructions     Diet general   Complete by: As directed    Discharge  instructions   Complete by: As directed    Follow-up with hospice at home.   Increase activity slowly   Complete by: As directed    No wound care   Complete by: As directed       Allergies as of 03/02/2023       Reactions    Sertraline Hcl Rash, Other (See Comments)   "Zoloft"        Medication List     STOP taking these medications    potassium chloride SA 20 MEQ tablet Commonly known as: KLOR-CON M   thiamine 100 MG tablet Commonly known as: VITAMIN B1       TAKE these medications    albuterol (2.5 MG/3ML) 0.083% nebulizer solution Commonly known as: PROVENTIL Take 3 mLs (2.5 mg total) by nebulization every 6 (six) hours as needed for wheezing or shortness of breath.   Baclofen 5 MG Tabs Take 1 tablet (5 mg total) by mouth 3 (three) times daily as needed for muscle spasms.   folic acid 1 MG tablet Commonly known as: FOLVITE Take 1 tablet (1 mg total) by mouth daily.   ondansetron 4 MG tablet Commonly known as: ZOFRAN Take 1 tablet (4 mg total) by mouth every 6 (six) hours as needed for nausea.   oxybutynin 5 MG tablet Commonly known as: DITROPAN Take 1 tablet (5 mg total) by mouth 3 (three) times daily.   oxyCODONE 5 MG immediate release tablet Commonly known as: Oxy IR/ROXICODONE Take 5-10 mg by mouth 2 (two) times daily as needed (for pain).   polyethylene glycol powder 17 GM/SCOOP powder Commonly known as: GLYCOLAX/MIRALAX Take 17 g by mouth daily.   polyvinyl alcohol 1.4 % ophthalmic solution Commonly known as: LIQUIFILM TEARS Place 1 drop into both eyes 4 (four) times daily as needed for dry eyes.   TYLENOL 500 MG tablet Generic drug: acetaminophen Take 500-1,000 mg by mouth every 6 (six) hours as needed for mild pain (pain score 1-3) or headache.          Time coordinating discharge: 39 minutes  Signed:  Talon Regala  [facility] 03/02/2023, 3:35 PM

## 2023-03-02 NOTE — TOC Transition Note (Signed)
Transition of Care [facility]) - CM/SW Discharge Note  Patient Details  Name: Noah Rosales MRN: [id number] Date of Birth: July 16, 1960  [facility] CM/SW Contact:  Ewing Schlein, LCSW Phone Number: 03/02/2023, 10:01 AM  Clinical Narrative: CSW followed up with Lanora Manis with [facility] and confirmed DME was scheduled for delivery to patient's home yesterday. Per Lanora Manis, the patient will need to have the CBI removed prior to discharge home as hospice cannot manage this in the home. Hospitalist and RN updated. Once CBI is removed, patient can discharge home. [facility] to admit patient to hospice after discharging home. TOC signing off.  Final next level of care: Home w Hospice Care Barriers to Discharge: Barriers Resolved  Patient Goals and CMS Choice CMS Medicare.gov Compare Post Acute Care list provided to:: Patient Represenative (must comment) (Brother: Catha Brow) Choice offered to / list presented to : Sibling  Discharge Plan and Services Additional resources added to the After Visit Summary for   In-house Referral: NA Discharge Planning Services: CM Consult Post Acute Care Choice: Hospice          DME Arranged:  ([facility] delivered DME to patient's home.) DME Agency: NA HH Arranged:  (home hospice) HH Agency: [facility] Date HH Agency Contacted: 02/28/23 Time HH Agency Contacted: 1400 Representative spoke with at [facility] Agency: Cheri  Social Determinants of Health (SDOH) Interventions SDOH Screenings   Food Insecurity: No Food Insecurity (02/26/2023)  Housing: Low Risk  (02/26/2023)  Transportation Needs: No Transportation Needs (02/26/2023)  Utilities: Not At Risk (02/26/2023)  Tobacco Use: High Risk (02/26/2023)   Readmission Risk Interventions    02/27/2023    5:07 PM  Readmission Risk Prevention Plan  Transportation Screening Complete  PCP or Specialist Appt within 3-5 Days Complete  HRI or Home Care  Consult Complete  Social Work Consult for Recovery Care Planning/Counseling Complete  Palliative Care Screening Complete  Medication Review Oceanographer) Complete

## 2023-03-02 NOTE — Progress Notes (Signed)
Urology Inpatient Progress Note  Subjective: No acute events overnight No issues with clotts, min cbi and urine light pink  Anti-infectives: Anti-infectives (From admission, onward)    Start     Dose/Rate Route Frequency Ordered Stop   02/27/23 1000  cefTRIAXone (ROCEPHIN) 2 g in sodium chloride 0.9 % 100 mL IVPB        2 g 200 mL/hr over 30 Minutes Intravenous Every 24 hours 02/27/23 0820         Current Facility-Administered Medications  Medication Dose Route Frequency Provider Last Rate Last Admin   0.9 %  sodium chloride infusion (Manually program via Guardrails IV Fluids)   Intravenous Once [name], Belkys A, MD   Stopping previously hung infusion at 02/27/23 2309   acetaminophen (TYLENOL) tablet 650 mg  650 mg Oral Q6H PRN Lanier Clam, NP   650 mg at 02/28/23 2118   Or   acetaminophen (TYLENOL) suppository 650 mg  650 mg Rectal Q6H PRN Bowser, Kaylyn Layer, NP       albuterol (PROVENTIL) (2.5 MG/3ML) 0.083% nebulizer solution 2.5 mg  2.5 mg Nebulization Q4H PRN [name], Belkys A, MD   2.5 mg at 02/27/23 1115   baclofen (LIORESAL) tablet 5 mg  5 mg Oral TID PRN Lanier Clam, NP   5 mg at 02/28/23 0917   cefTRIAXone (ROCEPHIN) 2 g in sodium chloride 0.9 % 100 mL IVPB  2 g Intravenous Q24H [name], Belkys A, MD   Stopped at 03/01/23 1126   Chlorhexidine Gluconate Cloth 2 % PADS 6 each  6 each Topical Q0600 Wyline Mood, NP   6 each at 03/02/23 6301   diphenhydrAMINE (BENADRYL) injection 25 mg  25 mg Intravenous Q4H PRN Bowser, Kaylyn Layer, NP       glycopyrrolate (ROBINUL) tablet 1 mg  1 mg Oral Q4H PRN Bowser, Kaylyn Layer, NP       Or   glycopyrrolate (ROBINUL) injection 0.2 mg  0.2 mg Subcutaneous Q4H PRN Bowser, Kaylyn Layer, NP       Or   glycopyrrolate (ROBINUL) injection 0.2 mg  0.2 mg Intravenous Q4H PRN Bowser, Kaylyn Layer, NP       midazolam (VERSED) injection 2-4 mg  2-4 mg Intravenous Q4H PRN Lanier Clam, NP   2 mg at 02/28/23 2122   morphine (PF) 2 MG/ML injection  2-4 mg  2-4 mg Intravenous Q30 min PRN Lanier Clam, NP   2 mg at 03/01/23 1405   mupirocin ointment (BACTROBAN) 2 % 1 Application  1 Application Nasal BID Lorin Glass, MD   1 Application at 03/01/23 2215   ondansetron (ZOFRAN) tablet 4 mg  4 mg Oral Q6H PRN [name], Belkys A, MD       Or   ondansetron (ZOFRAN) injection 4 mg  4 mg Intravenous Q6H PRN [name], Belkys A, MD       Oral care mouth rinse  15 mL Mouth Rinse PRN Lorin Glass, MD       oxybutynin (DITROPAN) tablet 5 mg  5 mg Oral TID [name], Belkys A, MD   5 mg at 03/01/23 2212   oxyCODONE (Oxy IR/ROXICODONE) immediate release tablet 5 mg  5 mg Oral Q6H PRN [name], Belkys A, MD   5 mg at 02/28/23 2325   polyvinyl alcohol (LIQUIFILM TEARS) 1.4 % ophthalmic solution 1 drop  1 drop Both Eyes QID PRN Bowser, Kaylyn Layer, NP       sodium chloride irrigation 0.9 % 3,000  mL  3,000 mL Irrigation Continuous Pokhrel, Laxman, MD   3,000 mL at 03/02/23 0525   sorbitol 70 % solution 30 mL  30 mL Oral Daily PRN [name], Belkys A, MD         Objective: Vital signs in last 24 hours: Temp:  [99.2 F (37.3 C)-100 F (37.8 C)] 99.3 F (37.4 C) (11/17 0400) Pulse Rate:  [78-100] 84 (11/17 0700) Resp:  [15-27] 21 (11/17 0700) BP: (104-135)/(42-61) 119/53 (11/17 0600) SpO2:  [91 %-100 %] 95 % (11/17 0700)  Intake/Output from previous day: 11/16 0701 - 11/17 0700 In: 12029.5 [IV Piggyback:29.5] Out: 52841 [Urine:22850] Intake/Output this shift: Total I/O In: 1050 [Other:1050] Out: 1900 [Urine:1900]  GENERAL APPEARANCE:  Well appearing, well developed, well nourished, NAD    Lab Results:  Recent Labs    02/27/23 1247  WBC 11.4*  HGB 7.3*  HCT 25.2*  PLT 250   BMET No results for input(s): "NA", "K", "CL", "CO2", "GLUCOSE", "BUN", "CREATININE", "CALCIUM" in the last 72 hours. PT/INR Recent Labs    02/27/23 1020  LABPROT 15.4*  INR 1.2   ABG No results for input(s): "PHART", "HCO3" in the last 72  hours.  Invalid input(s): "PCO2", "PO2"  Studies/Results: No results found.   Assessment & Plan: Bladder cancer-  plan for comfort care- hospice Hospice house will not take with CBI. Will try to stop in am   Joline Maxcy, MD 03/02/2023

## 2023-03-03 ENCOUNTER — Other Ambulatory Visit (HOSPITAL_COMMUNITY): Payer: Medicare HMO

## 2023-03-17 ENCOUNTER — Other Ambulatory Visit (HOSPITAL_COMMUNITY): Payer: Medicare HMO

## 2023-04-21 ENCOUNTER — Encounter (HOSPITAL_COMMUNITY): Payer: Self-pay

## 2023-04-21 ENCOUNTER — Emergency Department (HOSPITAL_COMMUNITY)

## 2023-04-21 ENCOUNTER — Inpatient Hospital Stay (HOSPITAL_COMMUNITY)
Admission: EM | Admit: 2023-04-21 | Discharge: 2023-04-23 | DRG: 698 | Disposition: A | Discharge status: Hospice | Attending: Internal Medicine | Admitting: Internal Medicine

## 2023-04-21 ENCOUNTER — Other Ambulatory Visit: Payer: Self-pay

## 2023-04-21 DIAGNOSIS — T83512A Infection and inflammatory reaction due to nephrostomy catheter, initial encounter: Principal | ICD-10-CM | POA: Diagnosis present

## 2023-04-21 DIAGNOSIS — Z66 Do not resuscitate: Secondary | ICD-10-CM | POA: Diagnosis not present

## 2023-04-21 DIAGNOSIS — N10 Acute tubulo-interstitial nephritis: Secondary | ICD-10-CM

## 2023-04-21 DIAGNOSIS — N390 Urinary tract infection, site not specified: Secondary | ICD-10-CM | POA: Diagnosis not present

## 2023-04-21 DIAGNOSIS — K219 Gastro-esophageal reflux disease without esophagitis: Secondary | ICD-10-CM | POA: Diagnosis present

## 2023-04-21 DIAGNOSIS — Z7401 Bed confinement status: Secondary | ICD-10-CM | POA: Diagnosis not present

## 2023-04-21 DIAGNOSIS — I2510 Atherosclerotic heart disease of native coronary artery without angina pectoris: Secondary | ICD-10-CM | POA: Diagnosis present

## 2023-04-21 DIAGNOSIS — Z87891 Personal history of nicotine dependence: Secondary | ICD-10-CM

## 2023-04-21 DIAGNOSIS — G9341 Metabolic encephalopathy: Secondary | ICD-10-CM | POA: Diagnosis present

## 2023-04-21 DIAGNOSIS — Y838 Other surgical procedures as the cause of abnormal reaction of the patient, or of later complication, without mention of misadventure at the time of the procedure: Secondary | ICD-10-CM | POA: Diagnosis present

## 2023-04-21 DIAGNOSIS — R509 Fever, unspecified: Secondary | ICD-10-CM | POA: Diagnosis present

## 2023-04-21 DIAGNOSIS — D75839 Thrombocytosis, unspecified: Secondary | ICD-10-CM | POA: Diagnosis present

## 2023-04-21 DIAGNOSIS — N136 Pyonephrosis: Secondary | ICD-10-CM | POA: Diagnosis present

## 2023-04-21 DIAGNOSIS — R0902 Hypoxemia: Secondary | ICD-10-CM | POA: Diagnosis not present

## 2023-04-21 DIAGNOSIS — N1330 Unspecified hydronephrosis: Secondary | ICD-10-CM | POA: Diagnosis not present

## 2023-04-21 DIAGNOSIS — R4182 Altered mental status, unspecified: Secondary | ICD-10-CM | POA: Diagnosis not present

## 2023-04-21 DIAGNOSIS — Z955 Presence of coronary angioplasty implant and graft: Secondary | ICD-10-CM

## 2023-04-21 DIAGNOSIS — E871 Hypo-osmolality and hyponatremia: Secondary | ICD-10-CM | POA: Diagnosis present

## 2023-04-21 DIAGNOSIS — J90 Pleural effusion, not elsewhere classified: Secondary | ICD-10-CM | POA: Diagnosis not present

## 2023-04-21 DIAGNOSIS — R6520 Severe sepsis without septic shock: Secondary | ICD-10-CM | POA: Diagnosis not present

## 2023-04-21 DIAGNOSIS — I700 Atherosclerosis of aorta: Secondary | ICD-10-CM | POA: Diagnosis present

## 2023-04-21 DIAGNOSIS — C679 Malignant neoplasm of bladder, unspecified: Secondary | ICD-10-CM | POA: Diagnosis present

## 2023-04-21 DIAGNOSIS — N99528 Other complication of other external stoma of urinary tract: Secondary | ICD-10-CM | POA: Diagnosis not present

## 2023-04-21 DIAGNOSIS — A419 Sepsis, unspecified organism: Secondary | ICD-10-CM | POA: Diagnosis not present

## 2023-04-21 DIAGNOSIS — R531 Weakness: Secondary | ICD-10-CM | POA: Diagnosis not present

## 2023-04-21 DIAGNOSIS — K5730 Diverticulosis of large intestine without perforation or abscess without bleeding: Secondary | ICD-10-CM | POA: Diagnosis not present

## 2023-04-21 DIAGNOSIS — I252 Old myocardial infarction: Secondary | ICD-10-CM | POA: Diagnosis not present

## 2023-04-21 DIAGNOSIS — F1010 Alcohol abuse, uncomplicated: Secondary | ICD-10-CM | POA: Diagnosis present

## 2023-04-21 DIAGNOSIS — K760 Fatty (change of) liver, not elsewhere classified: Secondary | ICD-10-CM | POA: Diagnosis present

## 2023-04-21 DIAGNOSIS — N12 Tubulo-interstitial nephritis, not specified as acute or chronic: Secondary | ICD-10-CM | POA: Diagnosis not present

## 2023-04-21 DIAGNOSIS — D62 Acute posthemorrhagic anemia: Secondary | ICD-10-CM | POA: Diagnosis present

## 2023-04-21 DIAGNOSIS — D649 Anemia, unspecified: Secondary | ICD-10-CM

## 2023-04-21 DIAGNOSIS — Z888 Allergy status to other drugs, medicaments and biological substances status: Secondary | ICD-10-CM

## 2023-04-21 DIAGNOSIS — I959 Hypotension, unspecified: Secondary | ICD-10-CM | POA: Diagnosis not present

## 2023-04-21 DIAGNOSIS — T83022A Displacement of nephrostomy catheter, initial encounter: Secondary | ICD-10-CM | POA: Diagnosis not present

## 2023-04-21 DIAGNOSIS — A4102 Sepsis due to Methicillin resistant Staphylococcus aureus: Secondary | ICD-10-CM | POA: Diagnosis present

## 2023-04-21 DIAGNOSIS — R000 Tachycardia, unspecified: Secondary | ICD-10-CM | POA: Diagnosis not present

## 2023-04-21 DIAGNOSIS — R918 Other nonspecific abnormal finding of lung field: Secondary | ICD-10-CM | POA: Diagnosis not present

## 2023-04-21 DIAGNOSIS — K8020 Calculus of gallbladder without cholecystitis without obstruction: Secondary | ICD-10-CM | POA: Diagnosis not present

## 2023-04-21 LAB — APTT: aPTT: 56 s — ABNORMAL HIGH (ref 24–36)

## 2023-04-21 LAB — CBC WITH DIFFERENTIAL/PLATELET
Abs Immature Granulocytes: 0.41 10*3/uL — ABNORMAL HIGH (ref 0.00–0.07)
Basophils Absolute: 0 10*3/uL (ref 0.0–0.1)
Basophils Relative: 0 %
Eosinophils Absolute: 0.1 10*3/uL (ref 0.0–0.5)
Eosinophils Relative: 0 %
HCT: 22.5 % — ABNORMAL LOW (ref 39.0–52.0)
Hemoglobin: 5.7 g/dL — CL (ref 13.0–17.0)
Immature Granulocytes: 2 %
Lymphocytes Relative: 6 %
Lymphs Abs: 1.4 10*3/uL (ref 0.7–4.0)
MCH: 17.6 pg — ABNORMAL LOW (ref 26.0–34.0)
MCHC: 25.3 g/dL — ABNORMAL LOW (ref 30.0–36.0)
MCV: 69.7 fL — ABNORMAL LOW (ref 80.0–100.0)
Monocytes Absolute: 1.1 10*3/uL — ABNORMAL HIGH (ref 0.1–1.0)
Monocytes Relative: 5 %
Neutro Abs: 18.2 10*3/uL — ABNORMAL HIGH (ref 1.7–7.7)
Neutrophils Relative %: 87 %
Platelets: 802 10*3/uL — ABNORMAL HIGH (ref 150–400)
RBC: 3.23 MIL/uL — ABNORMAL LOW (ref 4.22–5.81)
RDW: 23 % — ABNORMAL HIGH (ref 11.5–15.5)
WBC: 21.1 10*3/uL — ABNORMAL HIGH (ref 4.0–10.5)
nRBC: 0.5 % — ABNORMAL HIGH (ref 0.0–0.2)

## 2023-04-21 LAB — I-STAT CG4 LACTIC ACID, ED
Lactic Acid, Venous: 0.9 mmol/L (ref 0.5–1.9)
Lactic Acid, Venous: 1 mmol/L (ref 0.5–1.9)

## 2023-04-21 LAB — COMPREHENSIVE METABOLIC PANEL
ALT: 83 U/L — ABNORMAL HIGH (ref 0–44)
AST: 28 U/L (ref 15–41)
Albumin: 2.4 g/dL — ABNORMAL LOW (ref 3.5–5.0)
Alkaline Phosphatase: 77 U/L (ref 38–126)
Anion gap: 10 (ref 5–15)
BUN: 20 mg/dL (ref 8–23)
CO2: 26 mmol/L (ref 22–32)
Calcium: 7.6 mg/dL — ABNORMAL LOW (ref 8.9–10.3)
Chloride: 98 mmol/L (ref 98–111)
Creatinine, Ser: 0.88 mg/dL (ref 0.61–1.24)
GFR, Estimated: >60 mL/min (ref >60)
Glucose, Bld: 102 mg/dL — ABNORMAL HIGH (ref 70–99)
Potassium: 3.7 mmol/L (ref 3.5–5.1)
Sodium: 134 mmol/L — ABNORMAL LOW (ref 135–145)
Total Bilirubin: 1.5 mg/dL — ABNORMAL HIGH (ref 0.0–1.2)
Total Protein: 6.7 g/dL (ref 6.5–8.1)

## 2023-04-21 LAB — PROTIME-INR
INR: 1.4 — ABNORMAL HIGH (ref 0.8–1.2)
Prothrombin Time: 17.4 s — ABNORMAL HIGH (ref 11.4–15.2)

## 2023-04-21 LAB — PREPARE RBC (CROSSMATCH)

## 2023-04-21 LAB — AMMONIA: Ammonia: 29 umol/L (ref 9–35)

## 2023-04-21 MED ORDER — LACTATED RINGERS IV BOLUS
1000.0000 mL | Freq: Once | INTRAVENOUS | Status: AC
  Administered 2023-04-21: 1000 mL via INTRAVENOUS

## 2023-04-21 MED ORDER — LORAZEPAM 2 MG/ML IJ SOLN
1.0000 mg | Freq: Once | INTRAMUSCULAR | Status: DC
  Filled 2023-04-21: qty 1

## 2023-04-21 MED ORDER — IOHEXOL 300 MG/ML  SOLN
100.0000 mL | Freq: Once | INTRAMUSCULAR | Status: AC | PRN
  Administered 2023-04-21: 100 mL via INTRAVENOUS

## 2023-04-21 MED ORDER — LACTATED RINGERS IV SOLN: INTRAVENOUS | Status: DC

## 2023-04-21 MED ORDER — ACETAMINOPHEN 500 MG PO TABS
1000.0000 mg | ORAL_TABLET | Freq: Once | ORAL | Status: AC
Start: 2023-04-21 — End: 2023-04-21
  Administered 2023-04-21: 1000 mg via ORAL
  Filled 2023-04-21: qty 2

## 2023-04-21 MED ORDER — SODIUM CHLORIDE 0.9% IV SOLUTION: Freq: Once | INTRAVENOUS | Status: AC

## 2023-04-21 MED ORDER — LORAZEPAM 2 MG/ML IJ SOLN
1.0000 mg | Freq: Once | INTRAMUSCULAR | Status: AC
  Administered 2023-04-21: 1 mg via INTRAVENOUS
  Filled 2023-04-21: qty 1

## 2023-04-21 MED ORDER — SODIUM CHLORIDE 0.9 % IV SOLN
2.0000 g | Freq: Once | INTRAVENOUS | Status: AC
  Administered 2023-04-21: 2 g via INTRAVENOUS
  Filled 2023-04-21: qty 12.5

## 2023-04-21 NOTE — ED Notes (Signed)
 Pt refused EKG.

## 2023-04-21 NOTE — Sepsis Progress Note (Signed)
 Elink monitoring for the code sepsis protocol.

## 2023-04-21 NOTE — ED Notes (Addendum)
 Pt refused treatment, stating "I don't know why I'm here." Pt was alert and only oriented to himself. Provider advised pt having intermittent moments of confusion and would update whether to administer lorazepam.

## 2023-04-21 NOTE — ED Provider Notes (Signed)
 [place] EMERGENCY DEPARTMENT AT [facility] Provider Note   CSN: [id number] Arrival date & time: 04/21/23  1703     History  Chief Complaint  Patient presents with   Catheter issue    Noah Rosales is a 63 y.o. male.  Pt is a 62y/o male with hx of extensive high grade bladder cancer  requiring right neph tube (declined aggressive ca tx, elected for serial scans and palliative care) alcohol  use (reportedly about 25drinks/d), CAD who currently is living at home and coming in today with multiple issues.  Patient cannot give any history but his girlfriend Odetta gives the history.  She reports for the last 2 to 2-1/2 weeks patient has been more sluggish and sleeping a lot more.  She reports he wakes up takes his medicine and goes right back to bed.  Also a few days ago she reports he fell out of bed and she had to call people to come help pick him up off the floor.  He has not had any nausea, vomiting or new cough that she is noticed.  He has still been eating some.  He is still producing urine in both of his catheters but reports that he has not had either of them changed either the a urethral catheter or the urostomy tube since November.  She has not been aware of him running a fever.  However today he continued to get worse and she called 911.  She does report that at least a week or maybe more ago they increased his baclofen  to 10 mg tablets 3 times a day as well and she thought that that might be causing some of her his drowsiness.  On exam patient complains of back pain but cannot really give any further history.  He is able to interact but all he reports is that he wants beer.  [name] reports that he typically will drink 1 or 2 beers throughout the entire day which is a significant improvement from what he used to drink.  8:06 PM Patient's brother Signe is now present and helps makes decisions for the patient.  He reports that they called paramedics today because hospice has been  seeing the patient and are concerned that his nephrostomy tube is obstructed.  He has an appointment at 3 PM tomorrow with IR but they had no way of getting him up.  They wanted him to come up for evaluation and for tube change.  The history is provided by the patient. The history is limited by the condition of the patient.       Home Medications Prior to Admission medications   Medication Sig Start Date End Date Taking? Authorizing Provider  albuterol  (PROVENTIL ) (2.5 MG/3ML) 0.083% nebulizer solution Take 3 mLs (2.5 mg total) by nebulization every 6 (six) hours as needed for wheezing or shortness of breath. 03/02/23   Pokhrel, Vernal, MD  baclofen  5 MG TABS Take 1 tablet (5 mg total) by mouth 3 (three) times daily as needed for muscle spasms. 03/02/23   Pokhrel, Laxman, MD  folic acid  (FOLVITE ) 1 MG tablet Take 1 tablet (1 mg total) by mouth daily. Patient not taking: Reported on 02/26/2023 09/14/22   Jillian Buttery, MD  ondansetron  (ZOFRAN ) 4 MG tablet Take 1 tablet (4 mg total) by mouth every 6 (six) hours as needed for nausea. 03/02/23   Pokhrel, Vernal, MD  oxyCODONE  (OXY IR/ROXICODONE ) 5 MG immediate release tablet Take 5-10 mg by mouth 2 (two) times daily as  needed (for pain). 01/21/23   [provider]  polyethylene glycol powder (GLYCOLAX /MIRALAX ) 17 GM/SCOOP powder Take 17 g by mouth daily. Patient not taking: Reported on 02/26/2023 09/14/22   Adhikari, Amrit, MD  polyvinyl alcohol  (LIQUIFILM TEARS) 1.4 % ophthalmic solution Place 1 drop into both eyes 4 (four) times daily as needed for dry eyes. 03/02/23   Pokhrel, Laxman, MD  TYLENOL  500 MG tablet Take 500-1,000 mg by mouth every 6 (six) hours as needed for mild pain (pain score 1-3) or headache.    [provider]      Allergies    Sertraline hcl    Review of Systems   Review of Systems  Physical Exam Updated Vital Signs BP (!) 111/54   Pulse 89   Temp (!) 101.1 F (38.4 C) (Rectal)   Resp 20   SpO2 91%   Physical Exam Vitals and nursing note reviewed.  Constitutional:      General: He is not in acute distress.    Appearance: He is well-developed. He is ill-appearing.  HENT:     Head: Normocephalic and atraumatic.     Mouth/Throat:     Mouth: Mucous membranes are dry.  Eyes:     Conjunctiva/sclera: Conjunctivae normal.     Pupils: Pupils are equal, round, and reactive to light.  Cardiovascular:     Rate and Rhythm: Regular rhythm. Tachycardia present.     Heart sounds: No murmur heard. Pulmonary:     Effort: Pulmonary effort is normal. No respiratory distress.     Breath sounds: Normal breath sounds. No wheezing or rales.  Abdominal:     General: There is no distension.     Palpations: Abdomen is soft.     Tenderness: There is no abdominal tenderness. There is no guarding or rebound.     Comments: Right nephrostomy tube present without surrounding erythema but pain with palpation in the right flank  Genitourinary:    Comments: Foley catheter in place with hematuria present in the Foley bag Musculoskeletal:        General: No tenderness. Normal range of motion.     Cervical back: Normal range of motion and neck supple.     Right lower leg: No edema.     Left lower leg: No edema.  Skin:    General: Skin is warm and dry.     Findings: No erythema or rash.     Comments: Yellowing of the skin and slightly jaundiced appearing.  Hot to the touch  Neurological:     Mental Status: He is alert.     Comments: Patient is awake and oriented to self and place but seems slightly confused  Psychiatric:        Behavior: Behavior normal.     ED Results / Procedures / Treatments   Labs (all labs ordered are listed, but only abnormal results are displayed) Labs Reviewed  COMPREHENSIVE METABOLIC PANEL - Abnormal; Notable for the following components:      Result Value   Sodium 134 (*)    Glucose, Bld 102 (*)    Calcium 7.6 (*)    Albumin  2.4 (*)    ALT 83 (*)    Total Bilirubin 1.5  (*)    All other components within normal limits  PROTIME-INR - Abnormal; Notable for the following components:   Prothrombin Time 17.4 (*)    INR 1.4 (*)    All other components within normal limits  APTT - Abnormal; Notable for the following  components:   aPTT 56 (*)    All other components within normal limits  CBC WITH DIFFERENTIAL/PLATELET - Abnormal; Notable for the following components:   WBC 21.1 (*)    RBC 3.23 (*)    Hemoglobin 5.7 (*)    HCT 22.5 (*)    MCV 69.7 (*)    MCH 17.6 (*)    MCHC 25.3 (*)    RDW 23.0 (*)    Platelets 802 (*)    nRBC 0.5 (*)    Neutro Abs 18.2 (*)    Monocytes Absolute 1.1 (*)    Abs Immature Granulocytes 0.41 (*)    All other components within normal limits  CULTURE, BLOOD (ROUTINE X 2)  CULTURE, BLOOD (ROUTINE X 2)  AMMONIA  CBC WITH DIFFERENTIAL/PLATELET  CBC WITH DIFFERENTIAL/PLATELET  URINALYSIS, W/ REFLEX TO CULTURE (INFECTION SUSPECTED)  I-STAT CG4 LACTIC ACID, ED  I-STAT CG4 LACTIC ACID, ED  PREPARE RBC (CROSSMATCH)  TYPE AND SCREEN    EKG EKG Interpretation Date/Time:  Monday April 21 2023 21:26:23 EST Ventricular Rate:  90 PR Interval:  124 QRS Duration:  85 QT Interval:  360 QTC Calculation: 441 R Axis:   39  Text Interpretation: Sinus rhythm Nonspecific T abnrm, anterolateral leads No significant change since last tracing Confirmed by Doretha Folks ([id number]) on 04/21/2023 9:42:02 PM  Radiology CT Head Wo Contrast Result Date: 04/21/2023 CLINICAL DATA:  Altered mental status EXAM: CT HEAD WITHOUT CONTRAST TECHNIQUE: Contiguous axial images were obtained from the base of the skull through the vertex without intravenous contrast. RADIATION DOSE REDUCTION: This exam was performed according to the departmental dose-optimization program which includes automated exposure control, adjustment of the mA and/or kV according to patient size and/or use of iterative reconstruction technique. COMPARISON:  None Available. FINDINGS:  Brain: No evidence of acute infarction, hemorrhage, hydrocephalus, extra-axial collection or mass lesion/mass effect. Vascular: No hyperdense vessel or unexpected calcification. Skull: Normal. Negative for fracture or focal lesion. Sinuses/Orbits: No acute finding. Other: None. IMPRESSION: No acute intracranial abnormality noted. Electronically Signed   By: Oneil Devonshire M.D.   On: 04/21/2023 22:45   DG Chest Port 1 View Result Date: 04/21/2023 CLINICAL DATA:  Questionable sepsis - evaluate for abnormality EXAM: PORTABLE CHEST 1 VIEW COMPARISON:  11/07/2021.  CT 09/13/2022 FINDINGS: Small loculated left pleural effusion at the left base and laterally, similar to prior CT. Linear opacities at the left base, scarring or atelectasis. Right lung clear. Heart and mediastinal contours within normal limits. Prior CABG. IMPRESSION: Small loculated left pleural effusion. Left base scarring or atelectasis. Findings similar to prior CT. No acute cardiopulmonary disease. Electronically Signed   By: Franky Crease M.D.   On: 04/21/2023 19:01    Procedures Procedures    Medications Ordered in ED Medications  lactated ringers  infusion (has no administration in time range)  ceFEPIme  (MAXIPIME ) 2 g in sodium chloride  0.9 % 100 mL IVPB (2 g Intravenous New Bag/Given 04/21/23 2240)  0.9 %  sodium chloride  infusion (Manually program via Guardrails IV Fluids) (has no administration in time range)  iohexol  (OMNIPAQUE ) 300 MG/ML solution 100 mL (100 mLs Intravenous Contrast Given 04/21/23 2211)  lactated ringers  bolus 1,000 mL (1,000 mLs Intravenous New Bag/Given 04/21/23 2300)  acetaminophen  (TYLENOL ) tablet 1,000 mg (1,000 mg Oral Given 04/21/23 2253)  LORazepam  (ATIVAN ) injection 1 mg (1 mg Intravenous Given 04/21/23 2252)    ED Course/ Medical Decision Making/ A&P  Medical Decision Making Amount and/or Complexity of Data Reviewed Independent Historian: caregiver External Data Reviewed:  notes. Labs: ordered. Decision-making details documented in ED Course. Radiology: ordered and independent interpretation performed. Decision-making details documented in ED Course. ECG/medicine tests: ordered and independent interpretation performed. Decision-making details documented in ED Course.  Risk OTC drugs. Prescription drug management. Decision regarding hospitalization.   Pt with multiple medical problems and comorbidities and presenting today with a complaint that caries a high risk for morbidity and mortality.  Here today with multiple issues.  Concern for possible adverse medication reaction due to increasing the baclofen  versus sepsis from pyelonephritis, renal abscess, UTI, hepatic encephalopathy or possibly acute intracranial injury after his fall several days ago.  Patient given IV fluids, labs and imaging are pending.  Is not appear to be going through alcohol  withdrawal at this time. Patient's brother is now present and patient is currently refusing any blood work but his brother is trying to talk him into it as he has an appointment to have a nephrostomy tube change tomorrow at 3 PM.  It has not been draining well and concern for obstruction.  Patient is still loose enough to make his own decisions and do not feel that we can force blood work upon him until he is agreeable.  I have independently visualized and interpreted pt's images today.  Chest x-ray without acute changes.  CT of the head without evidence of acute bleed, abdominal CT shows hydronephrosis of the right kidney with surrounding fluid and air concerning for infection.  Radiology reports dislodged nephrostomy tube increased right posterior bladder wall mass with marked right hydronephrosis new fluid collection and gas in the right kidney near the tube and some bronchial wall thickening.  Patient's rectal temperature here was 101.1.  Code sepsis was initiated and he was covered for urinary pathology as it is most likely  source with his nephrostomy tube and indwelling Foley catheter.  I independently interpreted patient's labs and CBC today with a leukocytosis of 21, recurrent anemia with hemoglobin of 5.7 most likely from ongoing bleeding in his nephrostomy tubes from his known cancer, normal ammonia, normal lactic acid and CMP with normal renal function, ALT is elevated at 83 and total bilirubin is 1.5 but normal AST and anion gap.   Patient given blood and had already received antibiotics.  He remains hemodynamically stable.  Will admit for further care and nephrostomy change tomorrow as planned.  Consulted hospitalist for admission. CRITICAL CARE Performed by: Shelsea Hangartner Total critical care time: 40 minutes Critical care time was exclusive of separately billable procedures and treating other patients. Critical care was necessary to treat or prevent imminent or life-threatening deterioration. Critical care was time spent personally by me on the following activities: development of treatment plan with patient and/or surrogate as well as nursing, discussions with consultants, evaluation of patient's response to treatment, examination of patient, obtaining history from patient or surrogate, ordering and performing treatments and interventions, ordering and review of laboratory studies, ordering and review of radiographic studies, pulse oximetry and re-evaluation of patient's condition.          Final Clinical Impression(s) / ED Diagnoses Final diagnoses:  Sepsis without acute organ dysfunction, due to unspecified organism [facility])  Acute pyelonephritis  Nephrostomy complication [facility])    Rx / DC Orders ED Discharge Orders     None         Doretha Folks, MD 04/21/23 2320

## 2023-04-21 NOTE — ED Triage Notes (Signed)
 Pt coming from home. Called out due to pt needing a kidney tube to be replaced. Family poor historian, unable to explain why pt needed tube changed, and unsure when the last time it needed to be changed. Pt at baseline.

## 2023-04-22 ENCOUNTER — Inpatient Hospital Stay (HOSPITAL_COMMUNITY)

## 2023-04-22 ENCOUNTER — Other Ambulatory Visit (HOSPITAL_COMMUNITY): Payer: Medicare HMO

## 2023-04-22 ENCOUNTER — Encounter (HOSPITAL_COMMUNITY): Payer: Self-pay

## 2023-04-22 DIAGNOSIS — I700 Atherosclerosis of aorta: Secondary | ICD-10-CM | POA: Diagnosis present

## 2023-04-22 DIAGNOSIS — T83512A Infection and inflammatory reaction due to nephrostomy catheter, initial encounter: Secondary | ICD-10-CM | POA: Diagnosis present

## 2023-04-22 DIAGNOSIS — K219 Gastro-esophageal reflux disease without esophagitis: Secondary | ICD-10-CM | POA: Diagnosis present

## 2023-04-22 DIAGNOSIS — A419 Sepsis, unspecified organism: Secondary | ICD-10-CM

## 2023-04-22 DIAGNOSIS — R509 Fever, unspecified: Secondary | ICD-10-CM | POA: Diagnosis present

## 2023-04-22 DIAGNOSIS — A4102 Sepsis due to Methicillin resistant Staphylococcus aureus: Secondary | ICD-10-CM | POA: Diagnosis present

## 2023-04-22 DIAGNOSIS — N390 Urinary tract infection, site not specified: Secondary | ICD-10-CM

## 2023-04-22 DIAGNOSIS — Z955 Presence of coronary angioplasty implant and graft: Secondary | ICD-10-CM | POA: Diagnosis not present

## 2023-04-22 DIAGNOSIS — Z87891 Personal history of nicotine dependence: Secondary | ICD-10-CM | POA: Diagnosis not present

## 2023-04-22 DIAGNOSIS — N1330 Unspecified hydronephrosis: Secondary | ICD-10-CM | POA: Diagnosis not present

## 2023-04-22 DIAGNOSIS — D75839 Thrombocytosis, unspecified: Secondary | ICD-10-CM | POA: Diagnosis present

## 2023-04-22 DIAGNOSIS — G9341 Metabolic encephalopathy: Secondary | ICD-10-CM | POA: Diagnosis present

## 2023-04-22 DIAGNOSIS — I252 Old myocardial infarction: Secondary | ICD-10-CM | POA: Diagnosis not present

## 2023-04-22 DIAGNOSIS — K760 Fatty (change of) liver, not elsewhere classified: Secondary | ICD-10-CM | POA: Diagnosis present

## 2023-04-22 DIAGNOSIS — N136 Pyonephrosis: Secondary | ICD-10-CM | POA: Diagnosis present

## 2023-04-22 DIAGNOSIS — C679 Malignant neoplasm of bladder, unspecified: Secondary | ICD-10-CM | POA: Diagnosis present

## 2023-04-22 DIAGNOSIS — Y838 Other surgical procedures as the cause of abnormal reaction of the patient, or of later complication, without mention of misadventure at the time of the procedure: Secondary | ICD-10-CM | POA: Diagnosis present

## 2023-04-22 DIAGNOSIS — D649 Anemia, unspecified: Secondary | ICD-10-CM

## 2023-04-22 DIAGNOSIS — T83022A Displacement of nephrostomy catheter, initial encounter: Secondary | ICD-10-CM | POA: Diagnosis not present

## 2023-04-22 DIAGNOSIS — Z66 Do not resuscitate: Secondary | ICD-10-CM | POA: Diagnosis not present

## 2023-04-22 DIAGNOSIS — D62 Acute posthemorrhagic anemia: Secondary | ICD-10-CM | POA: Diagnosis present

## 2023-04-22 DIAGNOSIS — I2510 Atherosclerotic heart disease of native coronary artery without angina pectoris: Secondary | ICD-10-CM | POA: Diagnosis present

## 2023-04-22 DIAGNOSIS — Z888 Allergy status to other drugs, medicaments and biological substances status: Secondary | ICD-10-CM | POA: Diagnosis not present

## 2023-04-22 DIAGNOSIS — N99528 Other complication of other external stoma of urinary tract: Secondary | ICD-10-CM | POA: Diagnosis not present

## 2023-04-22 DIAGNOSIS — E871 Hypo-osmolality and hyponatremia: Secondary | ICD-10-CM | POA: Diagnosis present

## 2023-04-22 HISTORY — PX: IR NEPHROSTOMY PLACEMENT RIGHT: IMG6064

## 2023-04-22 LAB — BLOOD CULTURE ID PANEL (REFLEXED) - BCID2
A.calcoaceticus-baumannii: NOT DETECTED
Bacteroides fragilis: NOT DETECTED
Candida albicans: NOT DETECTED
Candida auris: NOT DETECTED
Candida glabrata: NOT DETECTED
Candida krusei: NOT DETECTED
Candida parapsilosis: NOT DETECTED
Candida tropicalis: NOT DETECTED
Cryptococcus neoformans/gattii: NOT DETECTED
Enterobacter cloacae complex: NOT DETECTED
Enterobacterales: NOT DETECTED
Enterococcus Faecium: NOT DETECTED
Enterococcus faecalis: NOT DETECTED
Escherichia coli: NOT DETECTED
Haemophilus influenzae: NOT DETECTED
Klebsiella aerogenes: NOT DETECTED
Klebsiella oxytoca: NOT DETECTED
Klebsiella pneumoniae: NOT DETECTED
Listeria monocytogenes: NOT DETECTED
Meth resistant mecA/C and MREJ: DETECTED — AB
Methicillin resistance mecA/C: DETECTED — AB
Neisseria meningitidis: NOT DETECTED
Proteus species: NOT DETECTED
Pseudomonas aeruginosa: NOT DETECTED
Salmonella species: NOT DETECTED
Serratia marcescens: NOT DETECTED
Staphylococcus aureus (BCID): DETECTED — AB
Staphylococcus epidermidis: DETECTED — AB
Staphylococcus lugdunensis: NOT DETECTED
Staphylococcus species: DETECTED — AB
Stenotrophomonas maltophilia: NOT DETECTED
Streptococcus agalactiae: NOT DETECTED
Streptococcus pneumoniae: NOT DETECTED
Streptococcus pyogenes: NOT DETECTED
Streptococcus species: NOT DETECTED

## 2023-04-22 LAB — URINALYSIS, W/ REFLEX TO CULTURE (INFECTION SUSPECTED)
Bilirubin Urine: NEGATIVE
Glucose, UA: NEGATIVE mg/dL
Ketones, ur: NEGATIVE mg/dL
Nitrite: NEGATIVE
Protein, ur: 100 mg/dL — AB
RBC / HPF: >50 RBC/hpf (ref 0–5)
Specific Gravity, Urine: 1.028 (ref 1.005–1.030)
WBC, UA: >50 WBC/hpf (ref 0–5)
pH: 7 (ref 5.0–8.0)

## 2023-04-22 LAB — COMPREHENSIVE METABOLIC PANEL
ALT: 70 U/L — ABNORMAL HIGH (ref 0–44)
AST: 23 U/L (ref 15–41)
Albumin: 2.2 g/dL — ABNORMAL LOW (ref 3.5–5.0)
Alkaline Phosphatase: 66 U/L (ref 38–126)
Anion gap: 7 (ref 5–15)
BUN: 19 mg/dL (ref 8–23)
CO2: 27 mmol/L (ref 22–32)
Calcium: 7.6 mg/dL — ABNORMAL LOW (ref 8.9–10.3)
Chloride: 99 mmol/L (ref 98–111)
Creatinine, Ser: 0.9 mg/dL (ref 0.61–1.24)
GFR, Estimated: >60 mL/min (ref >60)
Glucose, Bld: 100 mg/dL — ABNORMAL HIGH (ref 70–99)
Potassium: 3.7 mmol/L (ref 3.5–5.1)
Sodium: 133 mmol/L — ABNORMAL LOW (ref 135–145)
Total Bilirubin: 2.4 mg/dL — ABNORMAL HIGH (ref 0.0–1.2)
Total Protein: 6.2 g/dL — ABNORMAL LOW (ref 6.5–8.1)

## 2023-04-22 LAB — CBC
HCT: 28.9 % — ABNORMAL LOW (ref 39.0–52.0)
Hemoglobin: 7.9 g/dL — ABNORMAL LOW (ref 13.0–17.0)
MCH: 20.1 pg — ABNORMAL LOW (ref 26.0–34.0)
MCHC: 27.3 g/dL — ABNORMAL LOW (ref 30.0–36.0)
MCV: 73.4 fL — ABNORMAL LOW (ref 80.0–100.0)
Platelets: 665 10*3/uL — ABNORMAL HIGH (ref 150–400)
RBC: 3.94 MIL/uL — ABNORMAL LOW (ref 4.22–5.81)
RDW: 23.8 % — ABNORMAL HIGH (ref 11.5–15.5)
WBC: 19.6 10*3/uL — ABNORMAL HIGH (ref 4.0–10.5)
nRBC: 0.4 % — ABNORMAL HIGH (ref 0.0–0.2)

## 2023-04-22 MED ORDER — FENTANYL CITRATE (PF) 100 MCG/2ML IJ SOLN
INTRAMUSCULAR | Status: AC | PRN
  Administered 2023-04-22: 50 ug via INTRAVENOUS

## 2023-04-22 MED ORDER — IOHEXOL 300 MG/ML  SOLN
50.0000 mL | Freq: Once | INTRAMUSCULAR | Status: AC | PRN
Start: 2023-04-22 — End: 2023-04-22
  Administered 2023-04-22: 20 mL

## 2023-04-22 MED ORDER — LORAZEPAM 1 MG PO TABS: 1.0000 mg | ORAL_TABLET | ORAL | Status: DC | PRN

## 2023-04-22 MED ORDER — LIDOCAINE-EPINEPHRINE 1 %-1:100000 IJ SOLN
INTRAMUSCULAR | Status: AC
  Filled 2023-04-22: qty 1

## 2023-04-22 MED ORDER — SODIUM CHLORIDE 0.9 % IV SOLN
2.0000 g | Freq: Three times a day (TID) | INTRAVENOUS | Status: DC
  Administered 2023-04-22 – 2023-04-23 (×4): 2 g via INTRAVENOUS
  Filled 2023-04-22 (×4): qty 12.5

## 2023-04-22 MED ORDER — ACETAMINOPHEN 650 MG RE SUPP: 650.0000 mg | Freq: Four times a day (QID) | RECTAL | Status: DC | PRN

## 2023-04-22 MED ORDER — LIDOCAINE-EPINEPHRINE 1 %-1:100000 IJ SOLN
20.0000 mL | Freq: Once | INTRAMUSCULAR | Status: AC
  Administered 2023-04-22: 20 mL via INTRADERMAL
  Filled 2023-04-22: qty 20

## 2023-04-22 MED ORDER — LORAZEPAM 2 MG/ML IJ SOLN
1.0000 mg | INTRAMUSCULAR | Status: DC | PRN
  Administered 2023-04-22: 2 mg via INTRAVENOUS
  Filled 2023-04-22: qty 1

## 2023-04-22 MED ORDER — SODIUM CHLORIDE 0.9 % IV SOLN
INTRAVENOUS | Status: AC | PRN
  Administered 2023-04-22 (×2): 10 mL/h via INTRAVENOUS

## 2023-04-22 MED ORDER — DIPHENHYDRAMINE HCL 50 MG/ML IJ SOLN
INTRAMUSCULAR | Status: AC
Start: 2023-04-22 — End: ?
  Filled 2023-04-22: qty 1

## 2023-04-22 MED ORDER — MEPERIDINE HCL 25 MG/ML IJ SOLN
INTRAMUSCULAR | Status: AC | PRN
  Administered 2023-04-22: 25 mg via INTRAVENOUS

## 2023-04-22 MED ORDER — MIDAZOLAM HCL 2 MG/2ML IJ SOLN
INTRAMUSCULAR | Status: AC | PRN
  Administered 2023-04-22: 1 mg via INTRAVENOUS

## 2023-04-22 MED ORDER — FOLIC ACID 1 MG PO TABS
1.0000 mg | ORAL_TABLET | Freq: Every day | ORAL | Status: DC
  Administered 2023-04-22 – 2023-04-23 (×2): 1 mg via ORAL
  Filled 2023-04-22 (×2): qty 1

## 2023-04-22 MED ORDER — CHLORHEXIDINE GLUCONATE CLOTH 2 % EX PADS
6.0000 | MEDICATED_PAD | Freq: Every day | CUTANEOUS | Status: DC
  Administered 2023-04-22 – 2023-04-23 (×2): 6 via TOPICAL

## 2023-04-22 MED ORDER — ACETAMINOPHEN 325 MG PO TABS: 650.0000 mg | ORAL_TABLET | Freq: Four times a day (QID) | ORAL | Status: DC | PRN

## 2023-04-22 MED ORDER — THIAMINE HCL 100 MG/ML IJ SOLN
100.0000 mg | Freq: Every day | INTRAMUSCULAR | Status: DC
  Administered 2023-04-23: 100 mg via INTRAVENOUS
  Filled 2023-04-22: qty 2

## 2023-04-22 MED ORDER — DIPHENHYDRAMINE HCL 50 MG/ML IJ SOLN
INTRAMUSCULAR | Status: AC | PRN
  Administered 2023-04-22: 25 mg via INTRAVENOUS

## 2023-04-22 MED ORDER — ADULT MULTIVITAMIN W/MINERALS CH
1.0000 | ORAL_TABLET | Freq: Every day | ORAL | Status: DC
  Administered 2023-04-22 – 2023-04-23 (×2): 1 via ORAL
  Filled 2023-04-22 (×2): qty 1

## 2023-04-22 MED ORDER — MEPERIDINE HCL 100 MG/ML IJ SOLN
INTRAMUSCULAR | Status: AC
  Filled 2023-04-22: qty 1

## 2023-04-22 MED ORDER — SODIUM CHLORIDE 0.9 % IV SOLN
1750.0000 mg | INTRAVENOUS | Status: DC
  Administered 2023-04-23: 1750 mg via INTRAVENOUS
  Filled 2023-04-22: qty 17.5

## 2023-04-22 MED ORDER — VANCOMYCIN HCL IN DEXTROSE 1-5 GM/200ML-% IV SOLN
1000.0000 mg | Freq: Once | INTRAVENOUS | Status: AC
  Administered 2023-04-22: 1000 mg via INTRAVENOUS
  Filled 2023-04-22: qty 200

## 2023-04-22 MED ORDER — THIAMINE MONONITRATE 100 MG PO TABS
100.0000 mg | ORAL_TABLET | Freq: Every day | ORAL | Status: DC
  Administered 2023-04-22: 100 mg via ORAL
  Filled 2023-04-22 (×2): qty 1

## 2023-04-22 MED ORDER — SODIUM CHLORIDE 0.9% FLUSH
5.0000 mL | Freq: Three times a day (TID) | INTRAVENOUS | Status: DC
  Administered 2023-04-22 – 2023-04-23 (×4): 5 mL

## 2023-04-22 MED ORDER — FENTANYL CITRATE (PF) 100 MCG/2ML IJ SOLN
INTRAMUSCULAR | Status: AC
  Filled 2023-04-22: qty 2

## 2023-04-22 MED ORDER — MIDAZOLAM HCL 2 MG/2ML IJ SOLN
INTRAMUSCULAR | Status: AC
  Filled 2023-04-22: qty 4

## 2023-04-22 NOTE — H&P (Signed)
 History and Physical    Noah Rosales:[id number] DOB: Apr 17, 1960 DOA: 04/21/2023  PCP: Pcp, No  Patient coming from: Home  Chief Complaint: Nephrostomy tube issue  HPI: Noah Rosales is a 63 y.o. male with medical history significant of extensive high-grade bladder cancer requiring right nephrostomy tube received declined aggressive cancer treatment and elected for serial scans and palliative care), alcohol  abuse, CAD with stent.  He was admitted to the hospital 02/26/2023-03/02/2023 after his nephrostomy tube was accidentally dislodged and underwent tube placement by IR.  He was also noted to have hematuria with hemoglobin down to 6.3 and required blood transfusions.  He also had alcohol  withdrawal with seizure-like activity.  Patient had declined aggressive treatment and was transitioned to hospice level of care and discharged home.  Patient now brought into the ED by his girlfriend due to concern for him being sluggish and sleeping a lot more for the past 2-2.5 weeks in the setting of the dose of his home baclofen  being increased to 10 mg 3 times a day, confused, and fell out of bed a few days ago.  EDP spoke to the patient's brother/HCPOA who informed her that family had called paramedics because hospice has been seeing the patient and were concerned that his nephrostomy tube was obstructed.  Patient is already scheduled for nephrostomy tube replacement by IR on 1/7.  In the ED, patient noted to be febrile with temperature 101.1 F but not tachycardic or hypotensive.  Labs notable for WBC count 21.1, hemoglobin 5.7, MCV 69.7, platelet count 802k, sodium 134, glucose 102, creatinine 0.8, calcium 7.6, albumin  2.4, ALT 83, T. bili 1.5, AST and alk phos normal, ammonia level normal, lactic acid normal x 2, blood cultures collected, UA pending.  Chest x-ray showing small loculated left pleural effusion, left base scarring or atelectasis, findings similar to prior CT.  CT abdomen pelvis showing  dislodged right nephrostomy tube no longer within the right renal pelvis.  Increased size of the right posterior bladder wall mass with increased marked right hydroureteronephrosis.  New contained fluid and gas collection about the right kidney which measures 5.2 x 1.3 cm, likely related to the dislodged nephrostomy tube.  Small right pleural effusion with pleural thickening.  Bronchial wall thickening and mucous plugging in the lower lobes bilaterally.  In addition, showing evidence of hepatic steatosis and aortic atherosclerosis.  CT head negative for acute intracranial abnormality. Patient was given Tylenol , Ativan , vancomycin , cefepime , and 1 L LR bolus.  2 units PRBCs ordered.  TRH called to admit.  Patient is somnolent and confused, not able to give any history.  Per vital signs recorded by ED staff, patient is on 2 L nasal cannula but at the time of my evaluation he was noted to be satting 99-100% on room air without any signs of respiratory distress.  Review of Systems:  Review of Systems  Reason unable to perform ROS: AMS.    Past Medical History:  Diagnosis Date   Alcohol  abuse    CAD (coronary artery disease)    Dyspnea    GERD (gastroesophageal reflux disease)    Myocardial infarction (HCC) 04/16/2003   s/p stent to right coronary artery    Past Surgical History:  Procedure Laterality Date   CYSTOSCOPY WITH RETROGRADE PYELOGRAM, URETEROSCOPY AND STENT PLACEMENT N/A 09/09/2022   Procedure: CYSTOSCOPY WITH CLOT EVACUATION, CYSTOGRAM CONVERTED TO OPEN BLADDER EXPLORATION;  Surgeon: Devere Lonni Righter, MD;  Location: [facility] OR;  Service: Urology;  Laterality: N/A;   Exploration  of mediastinum, extracorporeal circulation,  11/23/10   Dr Lucas   IR NEPHROSTOMY EXCHANGE RIGHT  11/11/2022   IR NEPHROSTOMY EXCHANGE RIGHT  01/06/2023   IR NEPHROSTOMY PLACEMENT RIGHT  09/10/2022   IR NEPHROSTOMY PLACEMENT RIGHT  02/26/2023   s/p cagb x 4  11/22/10   Dr Lucas     reports that he has  been smoking pipe. He does not have any smokeless tobacco history on file. He reports current alcohol  use of about 56.0 standard drinks of alcohol  per week. He reports that he does not currently use drugs.  Allergies  Allergen Reactions   Sertraline Hcl Rash and Other (See Comments)    Zoloft    Family History  Problem Relation Age of Onset   Heart disease Other     Prior to Admission medications   Medication Sig Start Date End Date Taking? Authorizing Provider  albuterol  (PROVENTIL ) (2.5 MG/3ML) 0.083% nebulizer solution Take 3 mLs (2.5 mg total) by nebulization every 6 (six) hours as needed for wheezing or shortness of breath. 03/02/23   Pokhrel, Vernal, MD  baclofen  5 MG TABS Take 1 tablet (5 mg total) by mouth 3 (three) times daily as needed for muscle spasms. 03/02/23   Pokhrel, Laxman, MD  folic acid  (FOLVITE ) 1 MG tablet Take 1 tablet (1 mg total) by mouth daily. Patient not taking: Reported on 02/26/2023 09/14/22   Jillian Buttery, MD  ondansetron  (ZOFRAN ) 4 MG tablet Take 1 tablet (4 mg total) by mouth every 6 (six) hours as needed for nausea. 03/02/23   Pokhrel, Vernal, MD  oxyCODONE  (OXY IR/ROXICODONE ) 5 MG immediate release tablet Take 5-10 mg by mouth 2 (two) times daily as needed (for pain). 01/21/23   [provider]  polyethylene glycol powder (GLYCOLAX /MIRALAX ) 17 GM/SCOOP powder Take 17 g by mouth daily. Patient not taking: Reported on 02/26/2023 09/14/22   Adhikari, Amrit, MD  polyvinyl alcohol  (LIQUIFILM TEARS) 1.4 % ophthalmic solution Place 1 drop into both eyes 4 (four) times daily as needed for dry eyes. 03/02/23   Pokhrel, Laxman, MD  TYLENOL  500 MG tablet Take 500-1,000 mg by mouth every 6 (six) hours as needed for mild pain (pain score 1-3) or headache.    [provider]    Physical Exam: Vitals:   04/22/23 0051 04/22/23 0308 04/22/23 0349 04/22/23 0407  BP: (!) 120/59 105/60 (!) 104/55 (!) 106/53  Pulse: 84 75 76 74  Resp: 16 16 12 18    Temp: 98.7 F (37.1 C) 97.6 F (36.4 C) 97.6 F (36.4 C) 98.1 F (36.7 C)  TempSrc: Oral Axillary Axillary Axillary  SpO2: 100% 98% 97% 98%    Physical Exam Vitals reviewed.  Constitutional:      General: He is not in acute distress. HENT:     Head: Normocephalic and atraumatic.  Eyes:     Extraocular Movements: Extraocular movements intact.  Cardiovascular:     Rate and Rhythm: Normal rate and regular rhythm.     Pulses: Normal pulses.  Pulmonary:     Effort: Pulmonary effort is normal. No respiratory distress.     Breath sounds: No wheezing or rales.  Abdominal:     General: Bowel sounds are normal. There is no distension.     Palpations: Abdomen is soft.     Tenderness: There is no abdominal tenderness. There is no guarding.  Musculoskeletal:     Cervical back: Normal range of motion. No rigidity.     Right lower leg: No edema.  Left lower leg: No edema.  Skin:    General: Skin is warm and dry.  Neurological:     General: No focal deficit present.     Mental Status: He is alert.     Cranial Nerves: No cranial nerve deficit.     Sensory: No sensory deficit.     Motor: No weakness.     Comments: Somnolent but arousable Confused, oriented to person and place only Following commands appropriately, no focal neurodeficit     Labs on Admission: I have personally reviewed following labs and imaging studies  CBC: Recent Labs  Lab 04/21/23 2207  WBC 21.1*  NEUTROABS 18.2*  HGB 5.7*  HCT 22.5*  MCV 69.7*  PLT 802*   Basic Metabolic Panel: Recent Labs  Lab 04/21/23 2031  NA 134*  K 3.7  CL 98  CO2 26  GLUCOSE 102*  BUN 20  CREATININE 0.88  CALCIUM 7.6*   GFR: CrCl cannot be calculated (Unknown ideal weight.). Liver Function Tests: Recent Labs  Lab 04/21/23 2031  AST 28  ALT 83*  ALKPHOS 77  BILITOT 1.5*  PROT 6.7  ALBUMIN  2.4*   No results for input(s): LIPASE, AMYLASE in the last 168 hours. Recent Labs  Lab 04/21/23 2048   AMMONIA 29   Coagulation Profile: Recent Labs  Lab 04/21/23 2031  INR 1.4*   Cardiac Enzymes: No results for input(s): CKTOTAL, CKMB, CKMBINDEX, TROPONINI in the last 168 hours. BNP (last 3 results) No results for input(s): PROBNP in the last 8760 hours. HbA1C: No results for input(s): HGBA1C in the last 72 hours. CBG: No results for input(s): GLUCAP in the last 168 hours. Lipid Profile: No results for input(s): CHOL, HDL, LDLCALC, TRIG, CHOLHDL, LDLDIRECT in the last 72 hours. Thyroid Function Tests: No results for input(s): TSH, T4TOTAL, FREET4, T3FREE, THYROIDAB in the last 72 hours. Anemia Panel: No results for input(s): VITAMINB12, FOLATE, FERRITIN, TIBC, IRON, RETICCTPCT in the last 72 hours. Urine analysis:    Component Value Date/Time   COLORURINE RED (A) 02/26/2023 1919   APPEARANCEUR TURBID (A) 02/26/2023 1919   LABSPEC  02/26/2023 1919    TEST NOT REPORTED DUE TO COLOR INTERFERENCE OF URINE PIGMENT   PHURINE  02/26/2023 1919    TEST NOT REPORTED DUE TO COLOR INTERFERENCE OF URINE PIGMENT   GLUCOSEU (A) 02/26/2023 1919    TEST NOT REPORTED DUE TO COLOR INTERFERENCE OF URINE PIGMENT   HGBUR (A) 02/26/2023 1919    TEST NOT REPORTED DUE TO COLOR INTERFERENCE OF URINE PIGMENT   BILIRUBINUR (A) 02/26/2023 1919    TEST NOT REPORTED DUE TO COLOR INTERFERENCE OF URINE PIGMENT   KETONESUR (A) 02/26/2023 1919    TEST NOT REPORTED DUE TO COLOR INTERFERENCE OF URINE PIGMENT   PROTEINUR (A) 02/26/2023 1919    TEST NOT REPORTED DUE TO COLOR INTERFERENCE OF URINE PIGMENT   UROBILINOGEN 1.0 11/20/2010 1346   NITRITE (A) 02/26/2023 1919    TEST NOT REPORTED DUE TO COLOR INTERFERENCE OF URINE PIGMENT   LEUKOCYTESUR (A) 02/26/2023 1919    TEST NOT REPORTED DUE TO COLOR INTERFERENCE OF URINE PIGMENT    Radiological Exams on Admission: CT ABDOMEN PELVIS W CONTRAST Result Date: 04/21/2023 CLINICAL DATA:  Abdominal/flank pain,  hematuria EXAM: CT ABDOMEN AND PELVIS WITH CONTRAST TECHNIQUE: Multidetector CT imaging of the abdomen and pelvis was performed using the standard protocol following bolus administration of intravenous contrast. RADIATION DOSE REDUCTION: This exam was performed according to the departmental dose-optimization program which includes  automated exposure control, adjustment of the mA and/or kV according to patient size and/or use of iterative reconstruction technique. CONTRAST:  OMNIPAQUE  IOHEXOL  300 MG/ML  SOLN COMPARISON:  CT abdomen and pelvis 02/26/2019 FINDINGS: Lower chest: Small right pleural effusion with pleural thickening. Associated atelectasis. Bronchial wall thickening and mucous plugging in the lower lobes bilaterally. Hepatobiliary: Hepatic steatosis. Cholelithiasis without evidence of acute cholecystitis. No biliary dilation. Pancreas: Unremarkable. Spleen: Unremarkable. Adrenals/Urinary Tract: Stable adrenal glands. Unremarkable appearance of the left kidney. Increased marked right hydroureteronephrosis upstream from an area of irregular bladder wall thickening at the right UVJ measuring 5.3 x 3.9 cm. This is increased from 10/26/2022 when it measured 5.2 x 2.4 cm. The right nephrostomy tube is not within the right renal collecting system and abuts the posterior right kidney. There is a contained fluid and gas collection about the right kidney which measures 5.2 x 1.3 cm. Mild adjacent stranding. Foley catheter in the irregular thick-walled bladder. New focal bladder wall thickening along the anterior left bladder (circa series 9/image 76). Stomach/Bowel: Normal caliber large and small bowel. Colonic diverticulosis without diverticulitis. Moderate colonic stool burden. Stomach is within normal limits. Normal appendix. Vascular/Lymphatic: Aortic atherosclerosis. No enlarged abdominal or pelvic lymph nodes. Reproductive: Unremarkable. Other: Fat containing left inguinal hernia. Trace free fluid in  the pelvis. No free intraperitoneal air. Musculoskeletal: No acute fracture or destructive osseous lesion. IMPRESSION: 1. Dislodged right nephrostomy tube, no longer within the right renal pelvis. 2. Increased size of the right posterior bladder wall mass with increased marked right hydroureteronephrosis. 3. New contained fluid and gas collection about the right kidney which measures 5.2 x 1.3 cm, likely related to the dislodged nephrostomy tube. 4. Small right pleural effusion with pleural thickening. 5. Bronchial wall thickening and mucous plugging in the lower lobes bilaterally. 6. Hepatic steatosis. Aortic Atherosclerosis (ICD10-I70.0). These results were called by telephone at the time of interpretation on 04/21/2023 at 10:52 pm to provider WHITNEY PLUNKETT , who verbally acknowledged these results. Electronically Signed   By: Norman Gatlin M.D.   On: 04/21/2023 23:05   CT Head Wo Contrast Result Date: 04/21/2023 CLINICAL DATA:  Altered mental status EXAM: CT HEAD WITHOUT CONTRAST TECHNIQUE: Contiguous axial images were obtained from the base of the skull through the vertex without intravenous contrast. RADIATION DOSE REDUCTION: This exam was performed according to the departmental dose-optimization program which includes automated exposure control, adjustment of the mA and/or kV according to patient size and/or use of iterative reconstruction technique. COMPARISON:  None Available. FINDINGS: Brain: No evidence of acute infarction, hemorrhage, hydrocephalus, extra-axial collection or mass lesion/mass effect. Vascular: No hyperdense vessel or unexpected calcification. Skull: Normal. Negative for fracture or focal lesion. Sinuses/Orbits: No acute finding. Other: None. IMPRESSION: No acute intracranial abnormality noted. Electronically Signed   By: Oneil Devonshire M.D.   On: 04/21/2023 22:45   DG Chest Port 1 View Result Date: 04/21/2023 CLINICAL DATA:  Questionable sepsis - evaluate for abnormality EXAM:  PORTABLE CHEST 1 VIEW COMPARISON:  11/07/2021.  CT 09/13/2022 FINDINGS: Small loculated left pleural effusion at the left base and laterally, similar to prior CT. Linear opacities at the left base, scarring or atelectasis. Right lung clear. Heart and mediastinal contours within normal limits. Prior CABG. IMPRESSION: Small loculated left pleural effusion. Left base scarring or atelectasis. Findings similar to prior CT. No acute cardiopulmonary disease. Electronically Signed   By: Franky Crease M.D.   On: 04/21/2023 19:01    EKG: Independently reviewed.  Sinus rhythm, borderline  T wave abnormalities in anterior leads.  No significant change compared to previous tracing.  Assessment and Plan  Sepsis likely secondary to urinary tract infection Febrile with temperature 101.1 F and WBC count 21.1.  UA still pending but high suspicion for urinary source of infection given dislodged nephrostomy tube and CT findings.  Continue broad-spectrum antibiotics at this time.  Patient was given 1 L IV fluids in the ED.  Will hold off additional IV fluids given no tachycardia, lactic acidosis, or hypotension.  Follow-up UA and blood cultures.  Trend WBC count.  Acute on chronic anemia secondary to blood loss in the setting of bladder cancer/chronic hematuria Patient with chronic hematuria in the setting of bladder cancer and hemoglobin was 6.3 during admission in November and required blood transfusions.  Hemoglobin now down to 5.7 again but urine does not appear grossly bloody at this time.  No GI bleed symptoms reported per ED physician's discussion with the patient's family.  He is not on anticoagulation.  2 units PRBCs ordered in the ED. Follow-up posttransfusion H&H and continue to transfuse if hemoglobin less than 8 given CAD.  Extensive high-grade bladder cancer requiring right nephrostomy tube Patient has previously declined aggressive treatment and elected for palliative care.  He is currently on home  hospice.  Right nephrostomy tube dislodgment with increased marked right hydroureteronephrosis Creatinine stable.  Patient was supposed to undergo nephrostomy tube placement today on an outpatient basis.  Since he is admitted to the hospital now, IR needs to be notified in the morning so this can be done.  Acute metabolic encephalopathy Patient is somnolent and confused.  He did receive a dose of Ativan  in the ED.  CT head negative for acute intracranial abnormality and no focal neurodeficit on exam.  No meningeal signs.  Ammonia level normal.  Hold home baclofen  and avoid benzodiazepines and other sedating medications.  Alcohol  abuse No signs of withdrawal at this time.  Placed on CIWA protocol: Ativan  as needed.  Thiamine , folate, and multivitamin.    CAD with stent EKG without significant change and no anginal symptoms reported.  Thrombocytosis Platelet count 802k, repeat labs ordered to confirm.  Mild hyponatremia Patient received IV fluids in the ED, repeat labs ordered.  Mild elevation of liver enzymes Possibly due to sepsis. ALT 83, T. bili 1.5, AST and alk phos normal.  Repeat LFTs ordered.  DVT prophylaxis: SCDs Code Status:   Family Communication: No family available at this time. Level of care: Telemetry bed Admission status: It is my clinical opinion that referral for OBSERVATION is reasonable and necessary in this patient based on the above information provided. The aforementioned taken together are felt to place the patient at high risk for further clinical deterioration. However, it is anticipated that the patient may be medically stable for discharge from the hospital within 24 to 48 hours.  Editha Ram MD [facility]  If 7PM-7AM, please contact night-coverage [email or web address]  04/22/2023, 5:25 AM

## 2023-04-22 NOTE — Progress Notes (Signed)
   This pt is active with [facility]-  The pt was noted to have no drainage from his nephrostomy tube -- We had scheduled an out pt appointment with IR at [facility] for today at 330pm. Upon trying to arrange transportation to this appointment yesterday, we were unsuccessful in finding any agency that could accommodate him to this appointment. The pt had no drainage from the tube so it was decided to send him to the ED.   Magdalena Berber RN [phone]

## 2023-04-22 NOTE — Procedures (Signed)
 Vascular and Interventional Radiology Procedure Note  Patient: Noah Rosales DOB: 07/03/1960 Medical Record Number: [id number] Note Date/Time: 04/22/23 4:59 PM   Performing Physician: Thom Hall, MD Assistant(s): None  Diagnosis: Dislodged prior PCN  Procedure:  RIGHT NEPHROSTOMY TUBE PLACEMENT RIGHT ANTEROGRADE NEPHROSTOGRAM  Anesthesia: Conscious Sedation Complications: None Estimated Blood Loss: Minimal Specimens:  None  Findings:  Successful placement of a 12 F nephrostomy tube into the right kidney(s).   Plan: Flush PCN w 10 mL and record drain output qShift. Follow up for routine nephrostomy tube exchange in 6 week(s).   See detailed procedure note with images in PACS. The patient tolerated the procedure well without incident or complication and was returned to Recovery in stable condition.    Thom Hall, MD [facility]   Pager. [phone] Clinic. [phone]

## 2023-04-22 NOTE — ED Notes (Signed)
 Pt altered and unable to sign consent, Spoke with pt brother and he agreed to blood transfusion over the phone

## 2023-04-22 NOTE — ED Notes (Signed)
 ED TO INPATIENT HANDOFF REPORT  Name/Age/Gender Noah Rosales 63 y.o. male  Code Status    Code Status Orders  (From admission, onward)           Start     Ordered   04/22/23 0634  Do not attempt resuscitation (DNR) Pre-Arrest Interventions Desired  Continuous       Question Answer Comment  If pulseless and not breathing No CPR or chest compressions.   In Pre-Arrest Conditions (Patient Has Pulse and Is Breathing) May intubate, use advanced airway interventions and cardioversion/ACLS medications if appropriate or indicated. May transfer to ICU.   Consent: Discussion documented in EHR or advanced directives reviewed      04/22/23 0636           Code Status History     Date Active Date Inactive Code Status Order ID Comments User Context   02/27/2023 1608 03/02/2023 1707 Do not attempt resuscitation (DNR) - Comfort care [id number]  Daren Ronnald BRAVO, NP Inpatient   02/26/2023 1952 02/27/2023 1608 Full Code [id number]  Arthea Child, MD ED   02/26/2023 1608 02/26/2023 1745 Full Code [id number]  Adele Rush, MD HOV   09/08/2022 2312 09/13/2022 1921 Full Code [id number]  Lonzell Emeline HERO, DO ED   11/07/2021 2339 11/10/2021 2139 Full Code [id number]  Shona Terry SAILOR, DO ED      Advance Directive Documentation    Flowsheet Row Most Recent Value  Type of Advance Directive Out of facility DNR (pink MOST or yellow form)  Pre-existing out of facility DNR order (yellow form or pink MOST form) --  MOST Form in Place? --       Home/SNF/Other Home  Chief Complaint Fever [R50.9]  Level of Care/Admitting Diagnosis ED Disposition     ED Disposition  Admit   Condition  --   Comment  Hospital Area: [facility] [[id number]]  Level of Care: Telemetry [5]  Admit to tele based on following criteria: Other see comments  Comments: close monitoring  May admit patient to [facility] or [facility] if equivalent level of care is available:: Yes  Covid Evaluation:  Asymptomatic - no recent exposure (last 10 days) testing not required  Diagnosis: Fever [344092]  Admitting Physician: ALFORNIA MADISON [[id number]]  Attending Physician: DONNAMARIE LEBRON PARAS [[id number]]  Certification:: I certify this patient will need inpatient services for at least 2 midnights  Expected Medical Readiness: 04/24/2023          Medical History Past Medical History:  Diagnosis Date   Alcohol  abuse    CAD (coronary artery disease)    Dyspnea    GERD (gastroesophageal reflux disease)    Myocardial infarction (HCC) 04/16/2003   s/p stent to right coronary artery    Allergies Allergies  Allergen Reactions   Sertraline Hcl Rash and Other (See Comments)    Zoloft    IV Location/Drains/Wounds Patient Lines/Drains/Airways Status     Active Line/Drains/Airways     Name Placement date Placement time Site Days   Peripheral IV 04/21/23 20 G Anterior;Right;Upper Arm 04/21/23  2031  Arm  1   Peripheral IV 04/21/23 20 G Anterior;Right Forearm 04/21/23  2046  Forearm  1   Nephrostomy Right 12 Fr. 04/22/23  1720  Right  less than 1   Urethral Catheter Cam Stattenfield NP Triple-lumen 22 Fr. 02/27/23  1130  Triple-lumen  54   Pressure Injury 02/27/23 Vertebral column Upper Stage 2 -  Partial thickness loss of dermis presenting as a shallow  open injury with a red, pink wound bed without slough. 02/27/23  1429  -- 54   Wound / Incision (Open or Dehisced) 11/08/21 Irritant Dermatitis (Moisture Associated Skin Damage) Pelvis Anterior;Left;Right Groin 11/08/21  0333  Pelvis  530            Labs/Imaging Results for orders placed or performed during the hospital encounter of 04/21/23 (from the past 48 hours)  Urinalysis, w/ Reflex to Culture (Infection Suspected) -     Status: Abnormal   Collection Time: 04/21/23  6:07 AM  Result Value Ref Range   Specimen Source URINE, CLEAN CATCH    Color, Urine AMBER (A) YELLOW   APPearance TURBID (A) CLEAR   Specific Gravity, Urine  1.028 1.005 - 1.030   pH 7.0 5.0 - 8.0   Glucose, UA NEGATIVE NEGATIVE mg/dL   Hgb urine dipstick LARGE (A) NEGATIVE   Bilirubin Urine NEGATIVE NEGATIVE   Ketones, ur NEGATIVE NEGATIVE mg/dL   Protein, ur 899 (A) NEGATIVE mg/dL   Nitrite NEGATIVE NEGATIVE   Leukocytes,Ua LARGE (A) NEGATIVE   RBC / HPF >50 0 - 5 RBC/hpf   WBC, UA >50 0 - 5 WBC/hpf    Comment:        Reflex urine culture not performed if WBC <=10, OR if Squamous epithelial cells >5. If Squamous epithelial cells >5 suggest recollection.    Bacteria, UA FEW (A) NONE SEEN   Squamous Epithelial / HPF 0-5 0 - 5 /HPF   WBC Clumps PRESENT    Mucus PRESENT     Comment: Performed at [facility], [address] [place], [place] [address]  Type and screen [facility]     Status: None (Preliminary result)   Collection Time: 04/21/23  7:22 PM  Result Value Ref Range   ABO/RH(D) O NEG    Antibody Screen NEG    Sample Expiration 04/24/2023,2359    Unit Number [id number]    Blood Component Type RED CELLS,LR    Unit division 00    Status of Unit ISSUED    Transfusion Status OK TO TRANSFUSE    Crossmatch Result      Compatible Performed at [facility], [address], [place], [place] [address]    Unit Number [id number]    Blood Component Type RED CELLS,LR    Unit division 00    Status of Unit ISSUED    Transfusion Status OK TO TRANSFUSE    Crossmatch Result Compatible   Comprehensive metabolic panel     Status: Abnormal   Collection Time: 04/21/23  8:31 PM  Result Value Ref Range   Sodium 134 (L) 135 - 145 mmol/L   Potassium 3.7 3.5 - 5.1 mmol/L   Chloride 98 98 - 111 mmol/L   CO2 26 22 - 32 mmol/L   Glucose, Bld 102 (H) 70 - 99 mg/dL    Comment: Glucose reference range applies only to samples taken after fasting for at least 8 hours.   BUN 20 8 - 23 mg/dL   Creatinine, Ser 9.11 0.61 - 1.24 mg/dL   Calcium 7.6 (L) 8.9 - 10.3 mg/dL   Total Protein  6.7 6.5 - 8.1 g/dL   Albumin  2.4 (L) 3.5 - 5.0 g/dL   AST 28 15 - 41 U/L   ALT 83 (H) 0 - 44 U/L   Alkaline Phosphatase 77 38 - 126 U/L   Total Bilirubin 1.5 (H) 0.0 - 1.2 mg/dL   GFR, Estimated >39 >  60 mL/min    Comment: (NOTE) Calculated using the CKD-EPI Creatinine Equation (2021)    Anion gap 10 5 - 15    Comment: Performed at [facility], [address] [place], [place] [address]  Protime-INR     Status: Abnormal   Collection Time: 04/21/23  8:31 PM  Result Value Ref Range   Prothrombin Time 17.4 (H) 11.4 - 15.2 seconds   INR 1.4 (H) 0.8 - 1.2    Comment: (NOTE) INR goal varies based on device and disease states. Performed at [facility], [address], [place], [place] [address]   APTT     Status: Abnormal   Collection Time: 04/21/23  8:31 PM  Result Value Ref Range   aPTT 56 (H) 24 - 36 seconds    Comment:        IF BASELINE aPTT IS ELEVATED, SUGGEST PATIENT RISK ASSESSMENT BE USED TO DETERMINE APPROPRIATE ANTICOAGULANT THERAPY. Performed at [facility], [address] [place], [place] [address]   Blood Culture (routine x 2)     Status: None (Preliminary result)   Collection Time: 04/21/23  8:32 PM   Specimen: BLOOD RIGHT ARM  Result Value Ref Range   Specimen Description      BLOOD RIGHT ARM Performed at [facility], [address], [place], [place] [address]    Special Requests      BOTTLES DRAWN AEROBIC AND ANAEROBIC Blood Culture adequate volume Performed at [facility], [address] [place], [place] [address]    Culture  Setup Time      GRAM POSITIVE COCCI IN CLUSTERS IN BOTH AEROBIC AND ANAEROBIC BOTTLES CRITICAL VALUE NOTED.  VALUE IS CONSISTENT WITH PREVIOUSLY REPORTED AND CALLED VALUE. Performed at [facility], [address], [place], [place] [address]    Culture GRAM POSITIVE COCCI    Report Status PENDING   I-Stat Lactic Acid, ED      Status: None   Collection Time: 04/21/23  8:40 PM  Result Value Ref Range   Lactic Acid, Venous 0.9 0.5 - 1.9 mmol/L  Blood Culture (routine x 2)     Status: None (Preliminary result)   Collection Time: 04/21/23  8:48 PM   Specimen: BLOOD RIGHT ARM  Result Value Ref Range   Specimen Description      BLOOD RIGHT ARM Performed at [facility], [address], [place], [place] [address]    Special Requests      BOTTLES DRAWN AEROBIC AND ANAEROBIC Blood Culture adequate volume Performed at [facility], [address] [place], [place] [address]    Culture  Setup Time      GRAM POSITIVE COCCI IN CLUSTERS IN BOTH AEROBIC AND ANAEROBIC BOTTLES CRITICAL RESULT CALLED TO, READ BACK BY AND VERIFIED WITH: MAYA LOISE DEE [id number] AT 1419 BY CM Performed at [facility], [address] [place], [place] [address]    Culture GRAM POSITIVE COCCI    Report Status PENDING   Ammonia     Status: None   Collection Time: 04/21/23  8:48 PM  Result Value Ref Range   Ammonia 29 9 - 35 umol/L    Comment: Performed at [facility], [address], [place], [place] [address]  Blood Culture ID Panel (Reflexed)     Status: Abnormal   Collection Time: 04/21/23  8:48 PM  Result Value Ref Range   Enterococcus faecalis NOT  DETECTED NOT DETECTED   Enterococcus Faecium NOT DETECTED NOT DETECTED   Listeria monocytogenes NOT DETECTED NOT DETECTED   Staphylococcus species DETECTED (A) NOT DETECTED    Comment: CRITICAL RESULT CALLED TO, READ BACK BY AND VERIFIED WITH: PHARMF N BATCHELDER [id number] AT 1420 BY CM    Staphylococcus aureus (BCID) DETECTED (A) NOT DETECTED    Comment: Methicillin (oxacillin)-resistant Staphylococcus aureus (MRSA). MRSA is predictably resistant to beta-lactam antibiotics (except ceftaroline). Preferred therapy is vancomycin  unless clinically contraindicated. Patient requires contact precautions if  hospitalized. CRITICAL RESULT CALLED  TO, READ BACK BY AND VERIFIED WITH: PHARMD N BATCHELDER [id number] AT 1420 BY CM    Staphylococcus epidermidis DETECTED (A) NOT DETECTED    Comment: CRITICAL RESULT CALLED TO, READ BACK BY AND VERIFIED WITH: PHARMD N BATCHELDER [id number] AT 1420 BY CM    Staphylococcus lugdunensis NOT DETECTED NOT DETECTED   Streptococcus species NOT DETECTED NOT DETECTED   Streptococcus agalactiae NOT DETECTED NOT DETECTED   Streptococcus pneumoniae NOT DETECTED NOT DETECTED   Streptococcus pyogenes NOT DETECTED NOT DETECTED   A.calcoaceticus-baumannii NOT DETECTED NOT DETECTED   Bacteroides fragilis NOT DETECTED NOT DETECTED   Enterobacterales NOT DETECTED NOT DETECTED   Enterobacter cloacae complex NOT DETECTED NOT DETECTED   Escherichia coli NOT DETECTED NOT DETECTED   Klebsiella aerogenes NOT DETECTED NOT DETECTED   Klebsiella oxytoca NOT DETECTED NOT DETECTED   Klebsiella pneumoniae NOT DETECTED NOT DETECTED   Proteus species NOT DETECTED NOT DETECTED   Salmonella species NOT DETECTED NOT DETECTED   Serratia marcescens NOT DETECTED NOT DETECTED   Haemophilus influenzae NOT DETECTED NOT DETECTED   Neisseria meningitidis NOT DETECTED NOT DETECTED   Pseudomonas aeruginosa NOT DETECTED NOT DETECTED   Stenotrophomonas maltophilia NOT DETECTED NOT DETECTED   Candida albicans NOT DETECTED NOT DETECTED   Candida auris NOT DETECTED NOT DETECTED   Candida glabrata NOT DETECTED NOT DETECTED   Candida krusei NOT DETECTED NOT DETECTED   Candida parapsilosis NOT DETECTED NOT DETECTED   Candida tropicalis NOT DETECTED NOT DETECTED   Cryptococcus neoformans/gattii NOT DETECTED NOT DETECTED   Methicillin resistance mecA/C DETECTED (A) NOT DETECTED    Comment: CRITICAL RESULT CALLED TO, READ BACK BY AND VERIFIED WITH: PHARMD N BATCHELDER [id number] AT 1420 BY CM    Meth resistant mecA/C and MREJ DETECTED (A) NOT DETECTED    Comment: CRITICAL RESULT CALLED TO, READ BACK BY AND VERIFIED WITH: MAYA LOISE DEE  [id number] AT 1420 CM Performed at [facility], [address], [place], [place] [address]   CBC with Differential/Platelet     Status: Abnormal   Collection Time: 04/21/23 10:07 PM  Result Value Ref Range   WBC 21.1 (H) 4.0 - 10.5 K/uL   RBC 3.23 (L) 4.22 - 5.81 MIL/uL   Hemoglobin 5.7 (LL) 13.0 - 17.0 g/dL    Comment: REPEATED TO VERIFY Reticulocyte Hemoglobin testing may be clinically indicated, consider ordering this additional test [id number] THIS CRITICAL RESULT HAS VERIFIED AND BEEN CALLED TO RIVERS, T RN BY ABDULHALIM,MALAIKA ON 01 06 2025 AT 2236, AND HAS BEEN READ BACK.     HCT 22.5 (L) 39.0 - 52.0 %   MCV 69.7 (L) 80.0 - 100.0 fL   MCH 17.6 (L) 26.0 - 34.0 pg   MCHC 25.3 (L) 30.0 - 36.0 g/dL   RDW 76.9 (H) 88.4 - 84.4 %   Platelets 802 (H) 150 - 400 K/uL    Comment: REPEATED TO VERIFY   nRBC 0.5 (H) 0.0 -  0.2 %   Neutrophils Relative % 87 %   Neutro Abs 18.2 (H) 1.7 - 7.7 K/uL   Lymphocytes Relative 6 %   Lymphs Abs 1.4 0.7 - 4.0 K/uL   Monocytes Relative 5 %   Monocytes Absolute 1.1 (H) 0.1 - 1.0 K/uL   Eosinophils Relative 0 %   Eosinophils Absolute 0.1 0.0 - 0.5 K/uL   Basophils Relative 0 %   Basophils Absolute 0.0 0.0 - 0.1 K/uL   Immature Granulocytes 2 %   Abs Immature Granulocytes 0.41 (H) 0.00 - 0.07 K/uL   Polychromasia PRESENT    Target Cells PRESENT     Comment: Performed at [facility], [address], [place], [place] [address]  I-Stat Lactic Acid, ED     Status: None   Collection Time: 04/21/23 10:17 PM  Result Value Ref Range   Lactic Acid, Venous 1.0 0.5 - 1.9 mmol/L  Prepare RBC (crossmatch)     Status: None   Collection Time: 04/21/23 11:00 PM  Result Value Ref Range   Order Confirmation      ORDER PROCESSED BY BLOOD BANK Performed at [facility], [address] [place], [place] [address]   CBC     Status: Abnormal   Collection Time: 04/22/23  7:10 AM  Result Value Ref Range   WBC 19.6 (H) 4.0  - 10.5 K/uL   RBC 3.94 (L) 4.22 - 5.81 MIL/uL   Hemoglobin 7.9 (L) 13.0 - 17.0 g/dL    Comment: Reticulocyte Hemoglobin testing may be clinically indicated, consider ordering this additional test [id number]    HCT 28.9 (L) 39.0 - 52.0 %   MCV 73.4 (L) 80.0 - 100.0 fL   MCH 20.1 (L) 26.0 - 34.0 pg   MCHC 27.3 (L) 30.0 - 36.0 g/dL   RDW 76.1 (H) 88.4 - 84.4 %   Platelets 665 (H) 150 - 400 K/uL   nRBC 0.4 (H) 0.0 - 0.2 %    Comment: Performed at [facility], [address] [place], [place] [address]  Comprehensive metabolic panel     Status: Abnormal   Collection Time: 04/22/23  7:10 AM  Result Value Ref Range   Sodium 133 (L) 135 - 145 mmol/L   Potassium 3.7 3.5 - 5.1 mmol/L   Chloride 99 98 - 111 mmol/L   CO2 27 22 - 32 mmol/L   Glucose, Bld 100 (H) 70 - 99 mg/dL    Comment: Glucose reference range applies only to samples taken after fasting for at least 8 hours.   BUN 19 8 - 23 mg/dL   Creatinine, Ser 9.09 0.61 - 1.24 mg/dL   Calcium 7.6 (L) 8.9 - 10.3 mg/dL   Total Protein 6.2 (L) 6.5 - 8.1 g/dL   Albumin  2.2 (L) 3.5 - 5.0 g/dL   AST 23 15 - 41 U/L   ALT 70 (H) 0 - 44 U/L   Alkaline Phosphatase 66 38 - 126 U/L   Total Bilirubin 2.4 (H) 0.0 - 1.2 mg/dL   GFR, Estimated >39 >39 mL/min    Comment: (NOTE) Calculated using the CKD-EPI Creatinine Equation (2021)    Anion gap 7 5 - 15    Comment: Performed at [facility], [address], [place], [place] [address]   CT ABDOMEN PELVIS W CONTRAST Result Date: 04/21/2023 CLINICAL DATA:  Abdominal/flank pain, hematuria EXAM: CT ABDOMEN AND PELVIS WITH CONTRAST TECHNIQUE: Multidetector CT imaging of the abdomen and pelvis was performed  using the standard protocol following bolus administration of intravenous contrast. RADIATION DOSE REDUCTION: This exam was performed according to the departmental dose-optimization program which includes automated exposure control, adjustment of the mA and/or kV  according to patient size and/or use of iterative reconstruction technique. CONTRAST:  OMNIPAQUE  IOHEXOL  300 MG/ML  SOLN COMPARISON:  CT abdomen and pelvis 02/26/2019 FINDINGS: Lower chest: Small right pleural effusion with pleural thickening. Associated atelectasis. Bronchial wall thickening and mucous plugging in the lower lobes bilaterally. Hepatobiliary: Hepatic steatosis. Cholelithiasis without evidence of acute cholecystitis. No biliary dilation. Pancreas: Unremarkable. Spleen: Unremarkable. Adrenals/Urinary Tract: Stable adrenal glands. Unremarkable appearance of the left kidney. Increased marked right hydroureteronephrosis upstream from an area of irregular bladder wall thickening at the right UVJ measuring 5.3 x 3.9 cm. This is increased from 10/26/2022 when it measured 5.2 x 2.4 cm. The right nephrostomy tube is not within the right renal collecting system and abuts the posterior right kidney. There is a contained fluid and gas collection about the right kidney which measures 5.2 x 1.3 cm. Mild adjacent stranding. Foley catheter in the irregular thick-walled bladder. New focal bladder wall thickening along the anterior left bladder (circa series 9/image 76). Stomach/Bowel: Normal caliber large and small bowel. Colonic diverticulosis without diverticulitis. Moderate colonic stool burden. Stomach is within normal limits. Normal appendix. Vascular/Lymphatic: Aortic atherosclerosis. No enlarged abdominal or pelvic lymph nodes. Reproductive: Unremarkable. Other: Fat containing left inguinal hernia. Trace free fluid in the pelvis. No free intraperitoneal air. Musculoskeletal: No acute fracture or destructive osseous lesion. IMPRESSION: 1. Dislodged right nephrostomy tube, no longer within the right renal pelvis. 2. Increased size of the right posterior bladder wall mass with increased marked right hydroureteronephrosis. 3. New contained fluid and gas collection about the right kidney which measures 5.2 x  1.3 cm, likely related to the dislodged nephrostomy tube. 4. Small right pleural effusion with pleural thickening. 5. Bronchial wall thickening and mucous plugging in the lower lobes bilaterally. 6. Hepatic steatosis. Aortic Atherosclerosis (ICD10-I70.0). These results were called by telephone at the time of interpretation on 04/21/2023 at 10:52 pm to provider WHITNEY PLUNKETT , who verbally acknowledged these results. Electronically Signed   By: Norman Gatlin M.D.   On: 04/21/2023 23:05   CT Head Wo Contrast Result Date: 04/21/2023 CLINICAL DATA:  Altered mental status EXAM: CT HEAD WITHOUT CONTRAST TECHNIQUE: Contiguous axial images were obtained from the base of the skull through the vertex without intravenous contrast. RADIATION DOSE REDUCTION: This exam was performed according to the departmental dose-optimization program which includes automated exposure control, adjustment of the mA and/or kV according to patient size and/or use of iterative reconstruction technique. COMPARISON:  None Available. FINDINGS: Brain: No evidence of acute infarction, hemorrhage, hydrocephalus, extra-axial collection or mass lesion/mass effect. Vascular: No hyperdense vessel or unexpected calcification. Skull: Normal. Negative for fracture or focal lesion. Sinuses/Orbits: No acute finding. Other: None. IMPRESSION: No acute intracranial abnormality noted. Electronically Signed   By: Oneil Devonshire M.D.   On: 04/21/2023 22:45   DG Chest Port 1 View Result Date: 04/21/2023 CLINICAL DATA:  Questionable sepsis - evaluate for abnormality EXAM: PORTABLE CHEST 1 VIEW COMPARISON:  11/07/2021.  CT 09/13/2022 FINDINGS: Small loculated left pleural effusion at the left base and laterally, similar to prior CT. Linear opacities at the left base, scarring or atelectasis. Right lung clear. Heart and mediastinal contours within normal limits. Prior CABG. IMPRESSION: Small loculated left pleural effusion. Left base scarring or atelectasis. Findings  similar to prior CT. No acute  cardiopulmonary disease. Electronically Signed   By: Franky Crease M.D.   On: 04/21/2023 19:01    Pending Labs Unresulted Labs (From admission, onward)     Start     Ordered   04/23/23 0500  CBC with Differential/Platelet  Tomorrow morning,   R        04/22/23 1230   04/23/23 0500  Comprehensive metabolic panel  Tomorrow morning,   R        04/22/23 1230   04/23/23 0500  Procalcitonin  Tomorrow morning,   R       References:    Procalcitonin Lower Respiratory Tract Infection AND Sepsis Procalcitonin Algorithm   04/22/23 1230   04/22/23 1659  Aerobic/Anaerobic Culture w Gram Stain (surgical/deep wound)  Once,   STAT       Comments: R PCN access    04/22/23 1659   04/21/23 2041  CBC with Differential/Platelet  Once,   STAT        04/21/23 2041   04/21/23 0607  Urine Culture  Once,   R        04/21/23 0607            Vitals/Pain Today's Vitals   04/22/23 1727 04/22/23 1730 04/22/23 1732 04/22/23 1800  BP:  130/85  130/73  Pulse: (!) 106 (!) 105 (!) 112 93  Resp: (!) 23 20 (!) 26   Temp:    98 F (36.7 C)  TempSrc:    Axillary  SpO2: 98% 96% 92% 95%  PainSc:        Isolation Precautions No active isolations  Medications Medications  acetaminophen  (TYLENOL ) tablet 650 mg (has no administration in time range)    Or  acetaminophen  (TYLENOL ) suppository 650 mg (has no administration in time range)  LORazepam  (ATIVAN ) tablet 1-4 mg ( Oral See Alternative 04/22/23 1401)    Or  LORazepam  (ATIVAN ) injection 1-4 mg (2 mg Intravenous Given 04/22/23 1401)  thiamine  (VITAMIN B1) tablet 100 mg (100 mg Oral Given 04/22/23 0942)    Or  thiamine  (VITAMIN B1) injection 100 mg ( Intravenous See Alternative 04/22/23 0942)  folic acid  (FOLVITE ) tablet 1 mg (1 mg Oral Given 04/22/23 0951)  multivitamin with minerals tablet 1 tablet (1 tablet Oral Given 04/22/23 0942)  ceFEPIme  (MAXIPIME ) 2 g in sodium chloride  0.9 % 100 mL IVPB (2 g Intravenous New Bag/Given 04/22/23  1806)  vancomycin  (VANCOCIN ) 1,750 mg in sodium chloride  0.9 % 500 mL IVPB (has no administration in time range)  sodium chloride  flush (NS) 0.9 % injection 5 mL (5 mLs Intracatheter Given 04/22/23 1806)  ceFEPIme  (MAXIPIME ) 2 g in sodium chloride  0.9 % 100 mL IVPB (0 g Intravenous Stopped 04/22/23 0027)  iohexol  (OMNIPAQUE ) 300 MG/ML solution 100 mL (100 mLs Intravenous Contrast Given 04/21/23 2211)  lactated ringers  bolus 1,000 mL (0 mLs Intravenous Stopped 04/22/23 0028)  acetaminophen  (TYLENOL ) tablet 1,000 mg (1,000 mg Oral Given 04/21/23 2253)  LORazepam  (ATIVAN ) injection 1 mg (1 mg Intravenous Given 04/21/23 2252)  0.9 %  sodium chloride  infusion (Manually program via Guardrails IV Fluids) ( Intravenous New Bag/Given 04/22/23 0646)  vancomycin  (VANCOCIN ) IVPB 1000 mg/200 mL premix (0 mg Intravenous Stopped 04/22/23 0538)  vancomycin  (VANCOCIN ) IVPB 1000 mg/200 mL premix (0 mg Intravenous Stopped 04/22/23 0939)  0.9 %  sodium chloride  infusion (10 mL/hr Intravenous New Bag/Given 04/22/23 1721)  diphenhydrAMINE  (BENADRYL ) injection (25 mg Intravenous Given 04/22/23 1620)  iohexol  (OMNIPAQUE ) 300 MG/ML solution 50 mL (20 mLs Per Tube Contrast Given  04/22/23 1726)  lidocaine -EPINEPHrine  (XYLOCAINE  W/EPI) 1 %-1:100000 (with pres) injection 20 mL (20 mLs Intradermal Given 04/22/23 1726)  midazolam  (VERSED ) injection (1 mg Intravenous Given 04/22/23 1656)  meperidine  (DEMEROL ) injection (25 mg Intravenous Given 04/22/23 1656)  meperidine  (DEMEROL ) injection (25 mg Intravenous Given 04/22/23 1703)  meperidine  (DEMEROL ) injection (25 mg Intravenous Given 04/22/23 1705)  fentaNYL  (SUBLIMAZE ) injection (50 mcg Intravenous Given 04/22/23 1709)  midazolam  (VERSED ) injection (1 mg Intravenous Given 04/22/23 1657)  midazolam  (VERSED ) injection (1 mg Intravenous Given 04/22/23 1703)  midazolam  (VERSED ) injection (1 mg Intravenous Given 04/22/23 1705)  meperidine  (DEMEROL ) injection (25 mg Intravenous Given 04/22/23 1715)    Mobility walks  with person assist

## 2023-04-22 NOTE — ED Notes (Signed)
 Pt was assessed by this Clinical research associate. Pt able to correctly stated what year it is, how many quarters make a dollar, where is his and his birthday without incident.

## 2023-04-22 NOTE — ED Notes (Signed)
 Pt very agitated at this time, yelling and stating "I'm getting the fuck out of here". [facility] protocols being followed.

## 2023-04-22 NOTE — ED Notes (Addendum)
 Pt went to IR.

## 2023-04-22 NOTE — Progress Notes (Signed)
   Follow Up Note  HPI: Please see full H&P done earlier today Briefly, 63 year old male with extensive high-grade bladder cancer requiring right nephrostomy tube, alcohol  abuse, CAD s/p stents, presented to the ED due to worsening encephalopathy.  Of note, patient is a hospice patient, but was brought in for concerns that his nephrostomy tube was obstructed/dislodged.  Patient was already scheduled for nephrostomy tube replacement by IR on 1/7.  In the ED, patient noted to be septic with temp of 101.1 and a white count of 21.1.  Noted to have a hemoglobin of 5.7.  UA noted to be turbid, large hemoglobin, large leukocytes, few bacteria, WBC greater than 50, UC pending, BC x 2 pending.  Chest x-ray with small loculated left pleural effusion, similar findings to prior CT. CT head with no acute intracranial abnormality noted.  CT abdomen/pelvis showed dislodged right nephrostomy tube, noted increased marked right hydroureteronephrosis, noted new contained fluid and gas collection around the right kidney measuring about 5.2 x 1.3 cm likely related to dislodged nephrostomy tube.  Noted bronchial wall thickening and mucous plugging in lower lobes bilaterally.  Patient admitted for further management.   Later this morning, patient noted to be awake, more alert, oriented x 2.  Stated he was hungry.  Denied any new complaints, noted some productive cough as well as some mild suprapubic abdominal pain.   Exam: General: NAD, chronically appearing male Cardiovascular: S1, S2 present Respiratory: CTAB Abdomen: Soft, nontender, nondistended, bowel sounds present Musculoskeletal: No bilateral pedal edema noted Skin: Normal Psychiatry: Normal mood    Assessment and plan  Sepsis likely 2/2 UTI Continue broad-spectrum antibiotics Follow-up urine culture, blood culture  Acute on chronic anemia secondary to chronic blood loss/malignancy Hemoglobin 5.7 on admission S/p 2 units of PRBC transfusion, completed  on 04/22/2023 Trend CBC  Right nephrostomy tube dislodgment Noted as per CT IR on board for right nephrostomy tube replacement  Acute metabolic encephalopathy Likely 2/2 multifactorial including polypharmacy as well as possible sepsis CT head unremarkable Continue IV antibiotics Hold sedating medications for now

## 2023-04-22 NOTE — Progress Notes (Signed)
 PHARMACY - PHYSICIAN COMMUNICATION CRITICAL VALUE ALERT - BLOOD CULTURE IDENTIFICATION (BCID)  Noah Rosales is an 63 y.o. male who presented to [facility] on 04/21/2023 with a chief complaint of nephrostomy tube issue.   Assessment:   Tmax of 101.1 and WBC elevated. Blood cx positive for MRSA and staph epi.  Name of physician (or Provider) Contacted: Dr. Donnamarie  Current antibiotics: Vancomycin  and cefepime   Changes to prescribed antibiotics recommended:  Recommendations declined by provider due to severity of illness. ID will be auto-consulted and guide antibiotics. Continue vancomycin  and cefepime  at this time  Results for orders placed or performed during the hospital encounter of 04/21/23  Blood Culture ID Panel (Reflexed) (Collected: 04/21/2023  8:48 PM)  Result Value Ref Range   Enterococcus faecalis NOT DETECTED NOT DETECTED   Enterococcus Faecium NOT DETECTED NOT DETECTED   Listeria monocytogenes NOT DETECTED NOT DETECTED   Staphylococcus species DETECTED (A) NOT DETECTED   Staphylococcus aureus (BCID) DETECTED (A) NOT DETECTED   Staphylococcus epidermidis DETECTED (A) NOT DETECTED   Staphylococcus lugdunensis NOT DETECTED NOT DETECTED   Streptococcus species NOT DETECTED NOT DETECTED   Streptococcus agalactiae NOT DETECTED NOT DETECTED   Streptococcus pneumoniae NOT DETECTED NOT DETECTED   Streptococcus pyogenes NOT DETECTED NOT DETECTED   A.calcoaceticus-baumannii NOT DETECTED NOT DETECTED   Bacteroides fragilis NOT DETECTED NOT DETECTED   Enterobacterales NOT DETECTED NOT DETECTED   Enterobacter cloacae complex NOT DETECTED NOT DETECTED   Escherichia coli NOT DETECTED NOT DETECTED   Klebsiella aerogenes NOT DETECTED NOT DETECTED   Klebsiella oxytoca NOT DETECTED NOT DETECTED   Klebsiella pneumoniae NOT DETECTED NOT DETECTED   Proteus species NOT DETECTED NOT DETECTED   Salmonella species NOT DETECTED NOT DETECTED   Serratia marcescens NOT DETECTED NOT DETECTED    Haemophilus influenzae NOT DETECTED NOT DETECTED   Neisseria meningitidis NOT DETECTED NOT DETECTED   Pseudomonas aeruginosa NOT DETECTED NOT DETECTED   Stenotrophomonas maltophilia NOT DETECTED NOT DETECTED   Candida albicans NOT DETECTED NOT DETECTED   Candida auris NOT DETECTED NOT DETECTED   Candida glabrata NOT DETECTED NOT DETECTED   Candida krusei NOT DETECTED NOT DETECTED   Candida parapsilosis NOT DETECTED NOT DETECTED   Candida tropicalis NOT DETECTED NOT DETECTED   Cryptococcus neoformans/gattii NOT DETECTED NOT DETECTED   Methicillin resistance mecA/C DETECTED (A) NOT DETECTED   Meth resistant mecA/C and MREJ DETECTED (A) NOT DETECTED   Rankin Dee, PharmD, BCPS, BCIDP Clinical Pharmacist 04/22/2023 2:45 PM

## 2023-04-22 NOTE — Progress Notes (Signed)
 Pharmacy Antibiotic Note  Noah Rosales is a 63 y.o. male admitted on 04/21/2023 with sepsis.  Pharmacy has been consulted for vancomycin  and cefepime  dosing.  PMH includes extensive high-grade bladder cancer requiring right nephrostomy tube received declined aggressive cancer treatment and elected for serial scans and palliative care), alcohol  abuse, CAD with sten.   Pt currently in ED with right nephrostomy tube dislodgement with increased marked right hydronephrosis. Per notes, plan was tube placement today as outpt. IR to be notified so it can be done inpatient   No weight listed in this ED visit. Last visit from 02/28/23 has a listed weight of 95.4 kg. Will dose based off that weight   Plan: Cefepime  2 gr IV q8h  Vancomycin  1000 mg IV already given in ED. Will order additional 1000 mg IV for a total loading dose of 2000 mg. Then vancomycin  1750 mg IV q24h ( AUC 504, Scr 0.90 mg/dl, above weight of 04.5 kg)      Temp (24hrs), Avg:98.8 F (37.1 C), Min:97.6 F (36.4 C), Max:101.1 F (38.4 C)  Recent Labs  Lab 04/21/23 2031 04/21/23 2040 04/21/23 2207 04/21/23 2217 04/22/23 0710  WBC  --   --  21.1*  --   --   CREATININE 0.88  --   --   --  0.90  LATICACIDVEN  --  0.9  --  1.0  --     CrCl cannot be calculated (Unknown ideal weight.).    Allergies  Allergen Reactions   Sertraline Hcl Rash and Other (See Comments)    Zoloft    Antimicrobials this admission: 1/6 vancomycin  >>  1/6 cefepime  >>   Dose adjustments this admission:    Microbiology results: 1/6 BCx:  1/6 UCx:      Dolphus Roller, PharmD, BCPS 04/22/2023 8:20 AM

## 2023-04-22 NOTE — Progress Notes (Signed)
 Referring Physician(s): Ezenduka,N  Supervising Physician: Hughes Simmonds  Patient Status:  [facility] ED  Chief Complaint:  Dislodged right nephrostomy tube  Subjective: Patient known to IR team from right nephrostomy placement on 09/10/2022 followed by exchanges on 11/11/2022, 01/06/2023 and 02/26/23.  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  Patient has declined aggressive cancer treatment and currently on hospice care.  Patient presented to [facility] ED yesterday with weakness, recent falls, and dislodgment of right nephrostomy tube.  In the ED patient was noted to be febrile with elevated white count, hemoglobin 5.7.  Chest x-ray showed small loculated left pleural effusion, CT abdomen pelvis revealed dislodged right nephrostomy tube, increased size of right posterior bladder wall mass with increased moderate right hydroureteronephrosis, new contained fluid and gas collection along the right kidney likely related to dislodged nephrostomy tube, small right pleural effusion, fatty liver.  CT head was negative for acute process.  Patient currently afebrile, latest WBC 19.6, hemoglobin 7.9, platelets 665k, creatinine normal, total bilirubin 2.4, PT 17/INR 1.4, blood/urine cultures pending.  Request now received for right nephrostomy tube replacement.   Past Medical History:  Diagnosis Date   Alcohol  abuse    CAD (coronary artery disease)    Dyspnea    GERD (gastroesophageal reflux disease)    Myocardial infarction (HCC) 04/16/2003   s/p stent to right coronary artery   Past Surgical History:  Procedure Laterality Date   CYSTOSCOPY WITH RETROGRADE PYELOGRAM, URETEROSCOPY AND STENT PLACEMENT N/A 09/09/2022   Procedure: CYSTOSCOPY WITH CLOT EVACUATION, CYSTOGRAM CONVERTED TO  OPEN BLADDER EXPLORATION;  Surgeon: Devere Lonni Righter, MD;  Location: [facility];  Service: Urology;  Laterality: N/A;   Exploration of mediastinum, extracorporeal circulation,  11/23/10   Dr Lucas   IR NEPHROSTOMY EXCHANGE RIGHT  11/11/2022   IR NEPHROSTOMY EXCHANGE RIGHT  01/06/2023   IR NEPHROSTOMY PLACEMENT RIGHT  09/10/2022   IR NEPHROSTOMY PLACEMENT RIGHT  02/26/2023   s/p cagb x 4  11/22/10   Dr Lucas      Allergies: Sertraline hcl  Medications: Prior to Admission medications   Medication Sig Start Date End Date Taking? Authorizing Provider  albuterol  (PROVENTIL ) (2.5 MG/3ML) 0.083% nebulizer solution Take 3 mLs (2.5 mg total) by nebulization every 6 (six) hours as needed for wheezing or shortness of breath. 03/02/23   Pokhrel, Vernal, MD  baclofen  5 MG TABS Take 1 tablet (5 mg total) by mouth 3 (three) times daily as needed for muscle spasms. 03/02/23   Pokhrel, Laxman, MD  folic acid  (FOLVITE ) 1 MG tablet Take 1 tablet (1 mg total) by mouth daily. Patient not taking: Reported on 02/26/2023 09/14/22   Jillian Buttery, MD  ondansetron  (ZOFRAN ) 4 MG tablet Take 1 tablet (4 mg total) by mouth every 6 (six) hours as needed for nausea. 03/02/23   Pokhrel, Vernal, MD  oxyCODONE  (OXY IR/ROXICODONE ) 5 MG immediate release tablet Take 5-10 mg by mouth 2 (two) times daily as needed (for pain). 01/21/23   [provider]  polyethylene glycol powder (GLYCOLAX /MIRALAX ) 17 GM/SCOOP powder Take 17 g by mouth daily. Patient not taking: Reported on 02/26/2023 09/14/22   Adhikari, Amrit, MD  polyvinyl alcohol  (LIQUIFILM TEARS) 1.4 % ophthalmic solution Place 1 drop into both eyes 4 (four) times daily as needed for dry eyes. 03/02/23   Pokhrel, Laxman, MD  TYLENOL  500 MG tablet Take 500-1,000 mg by mouth every 6 (six) hours as needed for mild pain (pain score 1-3) or headache.    [provider]     Vital Signs: BP (!) 111/58   Pulse 77   Temp 98.8 F (37.1 C) (Oral)   Resp 17    SpO2 98%   Physical Exam: Patient awakens but still somewhat lethargic/confused, will follow some commands.  Chest with slightly diminished breath sounds bases.  Heart with regular rate and rhythm.  Abdomen soft, positive bowel sounds, currently nontender; right nephrostomy tube displaced with only small portion of tip of catheter in skin entry site; small amount of blood-tinged urine in bag; foley catheter with blood-tinged urine.  No significant lower extremity edema.  Imaging: CT ABDOMEN PELVIS W CONTRAST Result Date: 04/21/2023 CLINICAL DATA:  Abdominal/flank pain, hematuria EXAM: CT ABDOMEN AND PELVIS WITH CONTRAST TECHNIQUE: Multidetector CT imaging of the abdomen and pelvis was performed using the standard protocol following bolus administration of intravenous contrast. RADIATION DOSE REDUCTION: This exam was performed according to the departmental dose-optimization program which includes automated exposure control, adjustment of the mA and/or kV according to patient size and/or use of iterative reconstruction technique. CONTRAST:  OMNIPAQUE  IOHEXOL  300 MG/ML  SOLN COMPARISON:  CT abdomen and pelvis 02/26/2019 FINDINGS: Lower chest: Small right pleural effusion with pleural thickening. Associated atelectasis. Bronchial wall thickening and mucous plugging in the lower lobes bilaterally. Hepatobiliary: Hepatic steatosis. Cholelithiasis without evidence of acute cholecystitis. No biliary dilation. Pancreas: Unremarkable. Spleen: Unremarkable. Adrenals/Urinary Tract: Stable adrenal glands. Unremarkable appearance of the left kidney. Increased marked right hydroureteronephrosis upstream from an area of irregular bladder wall thickening at the right UVJ measuring 5.3 x 3.9 cm. This is increased from 10/26/2022 when it measured 5.2 x 2.4 cm. The right nephrostomy tube is not within the right renal collecting system and abuts the posterior right kidney. There is a contained fluid and gas collection about  the right kidney which measures 5.2 x 1.3 cm. Mild adjacent stranding. Foley catheter in the irregular thick-walled bladder. New focal bladder wall thickening along the anterior left bladder (circa series 9/image 76). Stomach/Bowel: Normal caliber large and small bowel. Colonic diverticulosis without diverticulitis. Moderate colonic stool burden. Stomach is within normal limits. Normal appendix. Vascular/Lymphatic: Aortic atherosclerosis. No enlarged abdominal or pelvic lymph nodes. Reproductive: Unremarkable. Other: Fat containing left inguinal hernia. Trace free fluid in the pelvis. No free intraperitoneal air. Musculoskeletal: No acute fracture or destructive osseous lesion. IMPRESSION: 1. Dislodged right nephrostomy tube, no longer within the right renal pelvis. 2. Increased size of the right posterior bladder wall mass with increased marked right hydroureteronephrosis. 3. New contained fluid and gas collection about the right kidney which measures 5.2 x 1.3 cm, likely related to the dislodged nephrostomy tube. 4. Small right pleural effusion with pleural thickening. 5. Bronchial wall thickening and mucous plugging in the lower lobes bilaterally. 6. Hepatic steatosis. Aortic Atherosclerosis (ICD10-I70.0). These results were called by telephone at the time of interpretation on 04/21/2023 at 10:52 pm to provider WHITNEY PLUNKETT , who verbally acknowledged these results. Electronically Signed   By: Norman Gatlin M.D.   On: 04/21/2023 23:05   CT Head Wo Contrast Result Date: 04/21/2023 CLINICAL DATA:  Altered mental status EXAM: CT HEAD WITHOUT CONTRAST TECHNIQUE: Contiguous axial images were obtained from the base of the skull through the vertex without intravenous contrast. RADIATION DOSE REDUCTION: This exam was performed according to the departmental dose-optimization program which includes automated exposure  control, adjustment of the mA and/or kV according to patient size and/or use of iterative  reconstruction technique. COMPARISON:  None Available. FINDINGS: Brain: No evidence of acute infarction, hemorrhage, hydrocephalus, extra-axial collection or mass lesion/mass effect. Vascular: No hyperdense vessel or unexpected calcification. Skull: Normal. Negative for fracture or focal lesion. Sinuses/Orbits: No acute finding. Other: None. IMPRESSION: No acute intracranial abnormality noted. Electronically Signed   By: Oneil Devonshire M.D.   On: 04/21/2023 22:45   DG Chest Port 1 View Result Date: 04/21/2023 CLINICAL DATA:  Questionable sepsis - evaluate for abnormality EXAM: PORTABLE CHEST 1 VIEW COMPARISON:  11/07/2021.  CT 09/13/2022 FINDINGS: Small loculated left pleural effusion at the left base and laterally, similar to prior CT. Linear opacities at the left base, scarring or atelectasis. Right lung clear. Heart and mediastinal contours within normal limits. Prior CABG. IMPRESSION: Small loculated left pleural effusion. Left base scarring or atelectasis. Findings similar to prior CT. No acute cardiopulmonary disease. Electronically Signed   By: Franky Crease M.D.   On: 04/21/2023 19:01    Labs:  CBC: Recent Labs    02/26/23 1406 02/27/23 1247 04/21/23 2207 04/22/23 0710  WBC 9.4 11.4* 21.1* 19.6*  HGB 6.3* 7.3* 5.7* 7.9*  HCT 23.8* 25.2* 22.5* 28.9*  PLT 277 250 802* 665*    COAGS: Recent Labs    09/16/22 2111 02/26/23 1423 02/27/23 1020 04/21/23 2031  INR 1.2 1.9* 1.2 1.4*  APTT  --   --   --  56*    BMP: Recent Labs    02/26/23 1406 02/27/23 0744 04/21/23 2031 04/22/23 0710  NA 129* 130* 134* 133*  K 4.7 3.6 3.7 3.7  CL 94* 94* 98 99  CO2 24 28 26 27   GLUCOSE 96 134* 102* 100*  BUN 14 13 20 19   CALCIUM 8.0* 7.9* 7.6* 7.6*  CREATININE 0.82 0.84 0.88 0.90  GFRNONAA >60 >60 >60 >60    LIVER FUNCTION TESTS: Recent Labs    09/10/22 0058 09/16/22 2111 04/21/23 2031 04/22/23 0710  BILITOT 2.4* 1.0 1.5* 2.4*  AST 38 35 28 23  ALT 24 34 83* 70*  ALKPHOS 45 73  77 66  PROT 4.8* 5.4* 6.7 6.2*  ALBUMIN  2.4* 2.4* 2.4* 2.2*    Assessment and Plan: 63 year old male with past medical history significant for alcohol  abuse, coronary artery disease with prior MI/stenting, GERD, and extensive high-grade bladder cancer with 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  Patient has declined aggressive cancer treatment and currently under hospice care.  Patient presented to [facility] ED yesterday with weakness, recent falls, and dislodgment of right nephrostomy tube.  In the ED patient was noted to be febrile with elevated white count, hemoglobin 5.7.  Chest x-ray showed small loculated left pleural effusion, CT abdomen pelvis revealed dislodged right nephrostomy tube, increased size of right posterior bladder wall mass with increased moderate right hydroureteronephrosis, new contained fluid and gas collection along the right kidney likely related to dislodged nephrostomy tube, small right pleural effusion, fatty liver.  CT head was negative for acute process.  Patient currently afebrile, latest WBC 19.6, hemoglobin 7.9, platelets 665k, creatinine normal, total bilirubin 2.4, PT 17.4/INR 1.4, blood/urine cultures pending.  Request now received for right nephrostomy tube replacement.  Imaging studies have been reviewed by Dr. Hughes. Risks and benefits of right PCN placement was discussed with the patient/brother/HCPOA Signe Rosales including, but not limited to, infection,  bleeding, significant bleeding causing loss or decrease in renal function or damage to adjacent structures.   All of the patient's questions were answered, patient is agreeable to proceed.  Consent signed and in chart.  Procedure scheduled for today; patient currently on IV vancomycin  and Maxipime     Electronically Signed: D. Franky Rakers, PA-C 04/22/2023, 9:25 AM   I spent a total of 25  minutes at the the patient's bedside AND on the patient's hospital floor or unit, greater than 50% of which was counseling/coordinating care for right percutaneous nephrostomy    Patient ID: Noah Rosales, male   DOB: 1960-06-20, 63 y.o.   MRN: [id number]

## 2023-04-22 NOTE — ED Notes (Signed)
 Pt very agitated at this time. Pt is wanting to go home and is cussing this clinical research associate. Pt seems to be confused and refuses to answer any of my questions. Pts brother at bedside trying to talk him down. CIWA assessment performed and protocol followed. Attending notified of pts  changed behavior.

## 2023-04-23 ENCOUNTER — Other Ambulatory Visit (HOSPITAL_COMMUNITY): Payer: Self-pay

## 2023-04-23 ENCOUNTER — Telehealth (HOSPITAL_COMMUNITY): Payer: Self-pay | Admitting: Pharmacy Technician

## 2023-04-23 DIAGNOSIS — N99528 Other complication of other external stoma of urinary tract: Secondary | ICD-10-CM

## 2023-04-23 DIAGNOSIS — N1330 Unspecified hydronephrosis: Secondary | ICD-10-CM | POA: Diagnosis not present

## 2023-04-23 LAB — CBC WITH DIFFERENTIAL/PLATELET
Abs Immature Granulocytes: 0.17 10*3/uL — ABNORMAL HIGH (ref 0.00–0.07)
Basophils Absolute: 0 10*3/uL (ref 0.0–0.1)
Basophils Relative: 0 %
Eosinophils Absolute: 0.1 10*3/uL (ref 0.0–0.5)
Eosinophils Relative: 0 %
HCT: 26.9 % — ABNORMAL LOW (ref 39.0–52.0)
Hemoglobin: 7.1 g/dL — ABNORMAL LOW (ref 13.0–17.0)
Immature Granulocytes: 1 %
Lymphocytes Relative: 7 %
Lymphs Abs: 1.2 10*3/uL (ref 0.7–4.0)
MCH: 19.8 pg — ABNORMAL LOW (ref 26.0–34.0)
MCHC: 26.4 g/dL — ABNORMAL LOW (ref 30.0–36.0)
MCV: 75.1 fL — ABNORMAL LOW (ref 80.0–100.0)
Monocytes Absolute: 1 10*3/uL (ref 0.1–1.0)
Monocytes Relative: 6 %
Neutro Abs: 14.9 10*3/uL — ABNORMAL HIGH (ref 1.7–7.7)
Neutrophils Relative %: 86 %
Platelets: 636 10*3/uL — ABNORMAL HIGH (ref 150–400)
RBC: 3.58 MIL/uL — ABNORMAL LOW (ref 4.22–5.81)
RDW: 24.8 % — ABNORMAL HIGH (ref 11.5–15.5)
WBC: 17.4 10*3/uL — ABNORMAL HIGH (ref 4.0–10.5)
nRBC: 0.3 % — ABNORMAL HIGH (ref 0.0–0.2)

## 2023-04-23 LAB — URINE CULTURE

## 2023-04-23 LAB — COMPREHENSIVE METABOLIC PANEL
ALT: 53 U/L — ABNORMAL HIGH (ref 0–44)
AST: 24 U/L (ref 15–41)
Albumin: 2.2 g/dL — ABNORMAL LOW (ref 3.5–5.0)
Alkaline Phosphatase: 56 U/L (ref 38–126)
Anion gap: 11 (ref 5–15)
BUN: 17 mg/dL (ref 8–23)
CO2: 22 mmol/L (ref 22–32)
Calcium: 7.6 mg/dL — ABNORMAL LOW (ref 8.9–10.3)
Chloride: 103 mmol/L (ref 98–111)
Creatinine, Ser: 0.77 mg/dL (ref 0.61–1.24)
GFR, Estimated: >60 mL/min (ref >60)
Glucose, Bld: 101 mg/dL — ABNORMAL HIGH (ref 70–99)
Potassium: 3.5 mmol/L (ref 3.5–5.1)
Sodium: 136 mmol/L (ref 135–145)
Total Bilirubin: 1.5 mg/dL — ABNORMAL HIGH (ref 0.0–1.2)
Total Protein: 5.9 g/dL — ABNORMAL LOW (ref 6.5–8.1)

## 2023-04-23 LAB — TYPE AND SCREEN
ABO/RH(D): O NEG
Antibody Screen: NEGATIVE
Unit division: 0
Unit division: 0

## 2023-04-23 LAB — BPAM RBC
Blood Product Expiration Date: 202501192359
Blood Product Unit Number: 202501232359
ISSUE DATE / TIME: 202501070338
PRODUCT CODE: 202501070031
PRODUCT CODE: 202501192359
Unit Type and Rh: 202501232359
Unit Type and Rh: 9500
Unit Type and Rh: 9500
Unit Type and Rh: 9500

## 2023-04-23 LAB — PROCALCITONIN: Procalcitonin: 0.5 ng/mL

## 2023-04-23 MED ORDER — BACLOFEN 10 MG PO TABS: 5.0000 mg | ORAL_TABLET | Freq: Three times a day (TID) | ORAL | Status: DC

## 2023-04-23 MED ORDER — LINEZOLID 600 MG PO TABS: 600.0000 mg | ORAL_TABLET | Freq: Two times a day (BID) | ORAL | 0 refills | Status: AC

## 2023-04-23 NOTE — Telephone Encounter (Signed)
 Pharmacy Patient Advocate Encounter  Insurance verification completed.    The patient is insured through [employer]. Patient has Medicare and is not eligible for a copay card, but may be able to apply for patient assistance or Medicare RX Payment Plan (Patient Must reach out to their plan, if eligible for payment plan), if available.    Ran test claim for Linezolid  600mg  and the current 14 day co-pay is $0.00.   This test claim was processed through [facility]- copay amounts may vary at other pharmacies due to pharmacy/plan contracts, or as the patient moves through the different stages of their insurance plan.

## 2023-04-23 NOTE — Discharge Summary (Signed)
 Physician Discharge Summary  Noah Rosales FMW:[id number] DOB: Feb 24, 1961 DOA: 04/21/2023  PCP: Pcp, No  Admit date: 04/21/2023 Discharge date: 04/23/2023  Admitted From: Home Disposition:  Home  Recommendations for Outpatient Follow-up:  Follow up with home hospice as scheduled  Home Health: None  Equipment/Devices: Bed rails per partner request  Discharge Condition: Poor  CODE STATUS:DNR  Diet recommendation: Comfort feedings as tolerated    Brief/Interim Summary: Noah Rosales is a 63 y.o. male with medical history significant of extensive high-grade bladder cancer requiring right nephrostomy tube received declined aggressive cancer treatment and elected for serial scans and palliative care), alcohol  abuse, CAD with stent.  He was admitted to the hospital 02/26/2023-03/02/2023 after his nephrostomy tube was accidentally dislodged and underwent tube placement by IR.  He was also noted to have hematuria with hemoglobin down to 6.3 and required blood transfusions.  He also had alcohol  withdrawal with seizure-like activity. Patient had declined aggressive treatment and was transitioned to hospice level of care and discharged home.   Patient readmitted for dislodged nephrostomy tube - successfully replaced 1/7 -follow-up in 6 weeks per interventional radiology recommendations, discussed protecting tube at discharge to prevent repeat removal given high risk procedure and patient's worsening clinical status.  Patient's labs initially noted positive for sepsis secondary to UTI with concurrent staph bacteremia, discussed case with ID will transition to linezolid  to complete 14-day course over the next 11 days.  He is otherwise stable and agreeable for discharge home to resume hospice care -discussed weaning baclofen  over the next few weeks however given patient is on hospice will defer to hospice team for symptom management and pain control.  Discharge Diagnoses:  Principal Problem:   UTI  (urinary tract infection) Active Problems:   Alcohol  abuse   CAD (coronary artery disease)   Bladder cancer (HCC)   Sepsis (HCC)   Acute on chronic anemia   Hydroureteronephrosis   Acute metabolic encephalopathy   Fever    Discharge Instructions  Discharge Instructions     Call MD for:   Complete by: As directed    If nephrostomy tube becomes dislodged or is not functioning appropriately   Call MD for:  redness, tenderness, or signs of infection (pain, swelling, redness, odor or green/yellow discharge around incision site)   Complete by: As directed    Diet general   Complete by: As directed    Discharge instructions   Complete by: As directed    Complete all antibiotics as discussed, protect nephrostomy tube, if it is removed again he would need to return to the hospital for an additional procedure which is against the patient's wishes.   Increase activity slowly   Complete by: As directed    No dressing needed   Complete by: As directed       Allergies as of 04/23/2023       Reactions   Sertraline Hcl Rash, Other (See Comments)   Zoloft        Medication List     TAKE these medications    Baclofen  5 MG Tabs Take 1 tablet (5 mg total) by mouth 3 (three) times daily as needed for muscle spasms.   gabapentin 100 MG capsule Commonly known as: NEURONTIN Take 100 mg by mouth 2 (two) times daily.   linezolid  600 MG tablet Commonly known as: ZYVOX  Take 1 tablet (600 mg total) by mouth 2 (two) times daily.   LORazepam  1 MG tablet Commonly known as: ATIVAN  Take 1 mg by mouth  every 4 (four) hours as needed for anxiety.   ondansetron  4 MG tablet Commonly known as: ZOFRAN  Take 1 tablet (4 mg total) by mouth every 6 (six) hours as needed for nausea.   oxybutynin  5 MG tablet Commonly known as: DITROPAN  Take 5 mg by mouth 3 (three) times daily.   polyethylene glycol powder 17 GM/SCOOP powder Commonly known as: GLYCOLAX /MIRALAX  Take 17 g by mouth daily.    TYLENOL  500 MG tablet Generic drug: acetaminophen  Take 500-1,000 mg by mouth every 6 (six) hours as needed for mild pain (pain score 1-3) or headache.               Discharge Care Instructions  (From admission, onward)           Start     Ordered   04/23/23 0000  No dressing needed        04/23/23 1300            Allergies  Allergen Reactions   Sertraline Hcl Rash and Other (See Comments)    Zoloft    Consultations: Interventional radiology, palliative care, ID  Procedures/Studies: IR NEPHROSTOMY PLACEMENT RIGHT Result Date: 04/23/2023 INDICATION: Catheter malfunction. 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 Most recent exchange 02/26/2023, with report dated inadvertent retraction. CT AP (04/21/2023) confirming extrarenal catheter positioning. EXAM: ULTRASOUND AND FLUOROSCOPIC GUIDED PLACEMENT OF RIGHT NEPHROSTOMY TUBE COMPARISON:  CT AP, 04/21/2023. IR fluoroscopy, 09/10/2022 and 02/26/2023. MEDICATIONS: The patient was on scheduled IV antibiotics 100 mg Demerol  IV was administered. ANESTHESIA/SEDATION: Moderate (conscious) sedation was employed during this procedure. A total of Versed  4 mg and Fentanyl  50 mcg was administered intravenously. Moderate Sedation Time: 23 minutes. The patient's level of consciousness and vital signs were monitored continuously by radiology nursing throughout the procedure under my direct supervision. CONTRAST:  20 mL Isovue 300 - administered into the renal collecting system FLUOROSCOPY TIME:  Fluoroscopic dose; 7 mGy COMPLICATIONS: None immediate. PROCEDURE: The procedure, risks, benefits, and alternatives were explained to the patient and/or patient's representative, questions were encouraged and answered and informed consent was obtained. A timeout was performed prior to the initiation of the procedure. The operative site was prepped and draped in the usual sterile  fashion and a sterile drape was applied covering the operative field. A sterile gown and sterile gloves were used for the procedure. Local anesthesia was provided with 1% Lidocaine  with epinephrine . Fluoroscopic imaging demonstrating a lateral position of pre-existing nephrostomy catheter. Contrast injection confirming extrarenal location. The pre-existing catheter was then cut and removed. Ultrasound was used to localize the RIGHT kidney. Under direct ultrasound guidance, a 20 gauge needle was advanced into the renal collecting system. An ultrasound image documentation was performed. Access within the collecting system was confirmed with the efflux of urine followed by limited contrast injection. Under intermittent fluoroscopic guidance, an 0.018 wire was advanced into the collecting system and the tract was dilated with an Accustick stent. Next, over a short Amplatz wire, the track was further dilated ultimately allowing placement of a 12 Fr percutaneous nephrostomy catheter with end coiled and locked within the renal pelvis. Contrast was injected and several spot fluoroscopic images were obtained in various obliquities. The catheter was secured at the skin entrance site with an interrupted suture and a stat lock device and connected to a gravity bag. Dressings were applied. The patient tolerated procedure well without immediate postprocedural complication. FINDINGS: *Retracted pre-existing RIGHT nephrostomy catheter with extrarenal position. This  was cut then removed. *Ultrasound scanning demonstrates a moderate to severely dilated RIGHT collecting system. *Under a combination of ultrasound and fluoroscopic guidance, a posterior inferior calix was targeted allowing placement of a 10 Fr percutaneous nephrostomy catheter with end coiled and locked within the renal pelvis. Contrast injection confirmed appropriate positioning. IMPRESSION: 1. Retracted pre-existing RIGHT nephrostomy catheter with extrarenal  positioning. 2. Successful ultrasound and fluoroscopic guided new placement of a RIGHT 10 Fr percutaneous nephrostomy drainage catheter. 3. Moderate hydronephrosis with purulent urine on return. Samples were submitted to microbiology for analysis. PLAN: The patient will return to Vascular Interventional Radiology (VIR) for routine drainage catheter evaluation and exchange in 6 weeks. Thom Hall, MD [facility] Electronically Signed   By: Thom Hall M.D.   On: 04/23/2023 07:14   CT ABDOMEN PELVIS W CONTRAST Result Date: 04/21/2023 CLINICAL DATA:  Abdominal/flank pain, hematuria EXAM: CT ABDOMEN AND PELVIS WITH CONTRAST TECHNIQUE: Multidetector CT imaging of the abdomen and pelvis was performed using the standard protocol following bolus administration of intravenous contrast. RADIATION DOSE REDUCTION: This exam was performed according to the departmental dose-optimization program which includes automated exposure control, adjustment of the mA and/or kV according to patient size and/or use of iterative reconstruction technique. CONTRAST:  OMNIPAQUE  IOHEXOL  300 MG/ML  SOLN COMPARISON:  CT abdomen and pelvis 02/26/2019 FINDINGS: Lower chest: Small right pleural effusion with pleural thickening. Associated atelectasis. Bronchial wall thickening and mucous plugging in the lower lobes bilaterally. Hepatobiliary: Hepatic steatosis. Cholelithiasis without evidence of acute cholecystitis. No biliary dilation. Pancreas: Unremarkable. Spleen: Unremarkable. Adrenals/Urinary Tract: Stable adrenal glands. Unremarkable appearance of the left kidney. Increased marked right hydroureteronephrosis upstream from an area of irregular bladder wall thickening at the right UVJ measuring 5.3 x 3.9 cm. This is increased from 10/26/2022 when it measured 5.2 x 2.4 cm. The right nephrostomy tube is not within the right renal collecting system and abuts the posterior right  kidney. There is a contained fluid and gas collection about the right kidney which measures 5.2 x 1.3 cm. Mild adjacent stranding. Foley catheter in the irregular thick-walled bladder. New focal bladder wall thickening along the anterior left bladder (circa series 9/image 76). Stomach/Bowel: Normal caliber large and small bowel. Colonic diverticulosis without diverticulitis. Moderate colonic stool burden. Stomach is within normal limits. Normal appendix. Vascular/Lymphatic: Aortic atherosclerosis. No enlarged abdominal or pelvic lymph nodes. Reproductive: Unremarkable. Other: Fat containing left inguinal hernia. Trace free fluid in the pelvis. No free intraperitoneal air. Musculoskeletal: No acute fracture or destructive osseous lesion. IMPRESSION: 1. Dislodged right nephrostomy tube, no longer within the right renal pelvis. 2. Increased size of the right posterior bladder wall mass with increased marked right hydroureteronephrosis. 3. New contained fluid and gas collection about the right kidney which measures 5.2 x 1.3 cm, likely related to the dislodged nephrostomy tube. 4. Small right pleural effusion with pleural thickening. 5. Bronchial wall thickening and mucous plugging in the lower lobes bilaterally. 6. Hepatic steatosis. Aortic Atherosclerosis (ICD10-I70.0). These results were called by telephone at the time of interpretation on 04/21/2023 at 10:52 pm to provider WHITNEY PLUNKETT , who verbally acknowledged these results. Electronically Signed   By: Norman Gatlin M.D.   On: 04/21/2023 23:05   CT Head Wo Contrast Result Date: 04/21/2023 CLINICAL DATA:  Altered mental status EXAM: CT HEAD WITHOUT CONTRAST TECHNIQUE: Contiguous axial images were obtained from the base of the skull through the vertex without intravenous contrast. RADIATION DOSE REDUCTION: This exam was performed according  to the departmental dose-optimization program which includes automated exposure control, adjustment of the mA and/or kV  according to patient size and/or use of iterative reconstruction technique. COMPARISON:  None Available. FINDINGS: Brain: No evidence of acute infarction, hemorrhage, hydrocephalus, extra-axial collection or mass lesion/mass effect. Vascular: No hyperdense vessel or unexpected calcification. Skull: Normal. Negative for fracture or focal lesion. Sinuses/Orbits: No acute finding. Other: None. IMPRESSION: No acute intracranial abnormality noted. Electronically Signed   By: Oneil Devonshire M.D.   On: 04/21/2023 22:45   DG Chest Port 1 View Result Date: 04/21/2023 CLINICAL DATA:  Questionable sepsis - evaluate for abnormality EXAM: PORTABLE CHEST 1 VIEW COMPARISON:  11/07/2021.  CT 09/13/2022 FINDINGS: Small loculated left pleural effusion at the left base and laterally, similar to prior CT. Linear opacities at the left base, scarring or atelectasis. Right lung clear. Heart and mediastinal contours within normal limits. Prior CABG. IMPRESSION: Small loculated left pleural effusion. Left base scarring or atelectasis. Findings similar to prior CT. No acute cardiopulmonary disease. Electronically Signed   By: Franky Crease M.D.   On: 04/21/2023 19:01     Subjective: No acute issues or events overnight, mental status appears to be approaching baseline, discussed with significant other/power of attorney who indicates patient would like to discharge home if possible on p.o. antibiotics.  Otherwise review of systems limited   Discharge Exam: Vitals:   04/23/23 0549 04/23/23 1041  BP: 115/71 111/66  Pulse: 81 79  Resp: 17 16  Temp: 98.3 F (36.8 C) 97.7 F (36.5 C)  SpO2: 95% 98%   Vitals:   04/23/23 0147 04/23/23 0510 04/23/23 0549 04/23/23 1041  BP: (!) 108/53  115/71 111/66  Pulse: 87  81 79  Resp: 18  17 16   Temp: 98.8 F (37.1 C)  98.3 F (36.8 C) 97.7 F (36.5 C)  TempSrc: Oral  Oral Oral  SpO2: 93%  95% 98%  Weight:  86.4 kg     General: Pt is alert, awake, not in acute  distress Cardiovascular: RRR, S1/S2 +, no rubs, no gallops Respiratory: CTA bilaterally, no wheezing, no rhonchi Abdominal: Soft, NT, ND, bowel sounds + Extremities: no edema, no cyanosis   The results of significant diagnostics from this hospitalization (including imaging, microbiology, ancillary and laboratory) are listed below for reference.     Microbiology: Recent Results (from the past 240 hours)  Urine Culture     Status: Abnormal   Collection Time: 04/21/23  6:07 AM   Specimen: Urine, Random  Result Value Ref Range Status   Specimen Description   Final    URINE, RANDOM Performed at [facility], [address] [place], [place] [address]    Special Requests   Final    NONE Reflexed from [id number] Performed at [facility], [address] [place], [place] [address]    Culture MULTIPLE SPECIES PRESENT, SUGGEST RECOLLECTION (A)  Final   Report Status 04/23/2023 FINAL  Final  Blood Culture (routine x 2)     Status: Abnormal (Preliminary result)   Collection Time: 04/21/23  8:32 PM   Specimen: BLOOD RIGHT ARM  Result Value Ref Range Status   Specimen Description   Final    BLOOD RIGHT ARM Performed at [facility], [address], [place], [place] [address]    Special Requests   Final    BOTTLES DRAWN AEROBIC AND ANAEROBIC Blood Culture adequate volume Performed at [facility], [address], [place], [place] [address]  Culture  Setup Time   Final    GRAM POSITIVE COCCI IN CLUSTERS IN BOTH AEROBIC AND ANAEROBIC BOTTLES CRITICAL VALUE NOTED.  VALUE IS CONSISTENT WITH PREVIOUSLY REPORTED AND CALLED VALUE. Performed at [facility], [address] [place], [place] [address]    Culture (A)  Final    STAPHYLOCOCCUS AUREUS STAPHYLOCOCCUS EPIDERMIDIS    Report Status PENDING  Incomplete  Blood Culture (routine x 2)     Status: Abnormal (Preliminary result)   Collection Time: 04/21/23   8:48 PM   Specimen: BLOOD RIGHT ARM  Result Value Ref Range Status   Specimen Description   Final    BLOOD RIGHT ARM Performed at [facility], [address], [place], [place] [address]    Special Requests   Final    BOTTLES DRAWN AEROBIC AND ANAEROBIC Blood Culture adequate volume Performed at [facility], [address], [place], [place] [address]    Culture  Setup Time   Final    GRAM POSITIVE COCCI IN CLUSTERS IN BOTH AEROBIC AND ANAEROBIC BOTTLES CRITICAL RESULT CALLED TO, READ BACK BY AND VERIFIED WITH: PHARMD N BATCHELDER [id number] AT 1419 BY CM    Culture (A)  Final    STAPHYLOCOCCUS AUREUS SUSCEPTIBILITIES TO FOLLOW STAPHYLOCOCCUS HOMINIS CULTURE REINCUBATED FOR BETTER GROWTH Performed at [facility], [address] [place], [place] [address]    Report Status PENDING  Incomplete  Blood Culture ID Panel (Reflexed)     Status: Abnormal   Collection Time: 04/21/23  8:48 PM  Result Value Ref Range Status   Enterococcus faecalis NOT DETECTED NOT DETECTED Final   Enterococcus Faecium NOT DETECTED NOT DETECTED Final   Listeria monocytogenes NOT DETECTED NOT DETECTED Final   Staphylococcus species DETECTED (A) NOT DETECTED Final    Comment: CRITICAL RESULT CALLED TO, READ BACK BY AND VERIFIED WITH: PHARMF N BATCHELDER [id number] AT 1420 BY CM    Staphylococcus aureus (BCID) DETECTED (A) NOT DETECTED Final    Comment: Methicillin (oxacillin)-resistant Staphylococcus aureus (MRSA). MRSA is predictably resistant to beta-lactam antibiotics (except ceftaroline). Preferred therapy is vancomycin  unless clinically contraindicated. Patient requires contact precautions if  hospitalized. CRITICAL RESULT CALLED TO, READ BACK BY AND VERIFIED WITH: PHARMD N BATCHELDER [id number] AT 1420 BY CM    Staphylococcus epidermidis DETECTED (A) NOT DETECTED Final    Comment: CRITICAL RESULT CALLED TO, READ BACK BY AND VERIFIED WITH: PHARMD N BATCHELDER [id number] AT 1420 BY  CM    Staphylococcus lugdunensis NOT DETECTED NOT DETECTED Final   Streptococcus species NOT DETECTED NOT DETECTED Final   Streptococcus agalactiae NOT DETECTED NOT DETECTED Final   Streptococcus pneumoniae NOT DETECTED NOT DETECTED Final   Streptococcus pyogenes NOT DETECTED NOT DETECTED Final   A.calcoaceticus-baumannii NOT DETECTED NOT DETECTED Final   Bacteroides fragilis NOT DETECTED NOT DETECTED Final   Enterobacterales NOT DETECTED NOT DETECTED Final   Enterobacter cloacae complex NOT DETECTED NOT DETECTED Final   Escherichia coli NOT DETECTED NOT DETECTED Final   Klebsiella aerogenes NOT DETECTED NOT DETECTED Final   Klebsiella oxytoca NOT DETECTED NOT DETECTED Final   Klebsiella pneumoniae NOT DETECTED NOT DETECTED Final   Proteus species NOT DETECTED NOT DETECTED Final   Salmonella species NOT DETECTED NOT DETECTED Final   Serratia marcescens NOT DETECTED NOT DETECTED Final   Haemophilus influenzae NOT DETECTED NOT DETECTED Final   Neisseria meningitidis NOT DETECTED NOT DETECTED Final   Pseudomonas aeruginosa NOT DETECTED NOT DETECTED Final   Stenotrophomonas  maltophilia NOT DETECTED NOT DETECTED Final   Candida albicans NOT DETECTED NOT DETECTED Final   Candida auris NOT DETECTED NOT DETECTED Final   Candida glabrata NOT DETECTED NOT DETECTED Final   Candida krusei NOT DETECTED NOT DETECTED Final   Candida parapsilosis NOT DETECTED NOT DETECTED Final   Candida tropicalis NOT DETECTED NOT DETECTED Final   Cryptococcus neoformans/gattii NOT DETECTED NOT DETECTED Final   Methicillin resistance mecA/C DETECTED (A) NOT DETECTED Final    Comment: CRITICAL RESULT CALLED TO, READ BACK BY AND VERIFIED WITH: PHARMD N BATCHELDER [id number] AT 1420 BY CM    Meth resistant mecA/C and MREJ DETECTED (A) NOT DETECTED Final    Comment: CRITICAL RESULT CALLED TO, READ BACK BY AND VERIFIED WITH: MAYA LOISE DEE [id number] AT 1420 CM Performed at [facility], [address]  [place], [place] [address]   Anaerobic culture w Gram Stain     Status: None (Preliminary result)   Collection Time: 04/22/23  4:59 PM   Specimen: Urine, Catheterized  Result Value Ref Range Status   Specimen Description URINE, CATHETERIZED RIGHT KIDNEY  Final   Special Requests NONE  Final   Gram Stain   Final    WBC PRESENT,BOTH PMN AND MONONUCLEAR GRAM POSITIVE COCCI IN CLUSTERS CYTOSPIN SMEAR Performed at [facility], [address], [place], [place] [address]    Culture PENDING  Incomplete   Report Status PENDING  Incomplete     Labs: BNP (last 3 results) No results for input(s): BNP in the last 8760 hours. Basic Metabolic Panel: Recent Labs  Lab 04/21/23 2031 04/22/23 0710 04/23/23 0327  NA 134* 133* 136  K 3.7 3.7 3.5  CL 98 99 103  CO2 26 27 22   GLUCOSE 102* 100* 101*  BUN 20 19 17   CREATININE 0.88 0.90 0.77  CALCIUM 7.6* 7.6* 7.6*   Liver Function Tests: Recent Labs  Lab 04/21/23 2031 04/22/23 0710 04/23/23 0327  AST 28 23 24   ALT 83* 70* 53*  ALKPHOS 77 66 56  BILITOT 1.5* 2.4* 1.5*  PROT 6.7 6.2* 5.9*  ALBUMIN  2.4* 2.2* 2.2*   No results for input(s): LIPASE, AMYLASE in the last 168 hours. Recent Labs  Lab 04/21/23 2048  AMMONIA 29   CBC: Recent Labs  Lab 04/21/23 2207 04/22/23 0710 04/23/23 0327  WBC 21.1* 19.6* 17.4*  NEUTROABS 18.2*  --  14.9*  HGB 5.7* 7.9* 7.1*  HCT 22.5* 28.9* 26.9*  MCV 69.7* 73.4* 75.1*  PLT 802* 665* 636*   Cardiac Enzymes: No results for input(s): CKTOTAL, CKMB, CKMBINDEX, TROPONINI in the last 168 hours. BNP: Invalid input(s): POCBNP CBG: No results for input(s): GLUCAP in the last 168 hours. D-Dimer No results for input(s): DDIMER in the last 72 hours. Hgb A1c No results for input(s): HGBA1C in the last 72 hours. Lipid Profile No results for input(s): CHOL, HDL, LDLCALC, TRIG, CHOLHDL, LDLDIRECT in the last 72 hours. Thyroid function studies No results for  input(s): TSH, T4TOTAL, T3FREE, THYROIDAB in the last 72 hours.  Invalid input(s): FREET3 Anemia work up No results for input(s): VITAMINB12, FOLATE, FERRITIN, TIBC, IRON, RETICCTPCT in the last 72 hours. Urinalysis    Component Value Date/Time   COLORURINE AMBER (A) 04/21/2023 0607   APPEARANCEUR TURBID (A) 04/21/2023 0607   LABSPEC 1.028 04/21/2023 0607   PHURINE 7.0 04/21/2023 0607   GLUCOSEU NEGATIVE 04/21/2023 0607   HGBUR LARGE (A) 04/21/2023 0607   BILIRUBINUR NEGATIVE 04/21/2023 0607   KETONESUR NEGATIVE  04/21/2023 0607   PROTEINUR 100 (A) 04/21/2023 0607   UROBILINOGEN 1.0 11/20/2010 1346   NITRITE NEGATIVE 04/21/2023 0607   LEUKOCYTESUR LARGE (A) 04/21/2023 0607   Sepsis Labs Recent Labs  Lab 04/21/23 2207 04/22/23 0710 04/23/23 0327  WBC 21.1* 19.6* 17.4*   Microbiology Recent Results (from the past 240 hours)  Urine Culture     Status: Abnormal   Collection Time: 04/21/23  6:07 AM   Specimen: Urine, Random  Result Value Ref Range Status   Specimen Description   Final    URINE, RANDOM Performed at [facility], [address] [place], [place] [address]    Special Requests   Final    NONE Reflexed from [id number] Performed at [facility], [address] [place], [place] [address]    Culture MULTIPLE SPECIES PRESENT, SUGGEST RECOLLECTION (A)  Final   Report Status 04/23/2023 FINAL  Final  Blood Culture (routine x 2)     Status: Abnormal (Preliminary result)   Collection Time: 04/21/23  8:32 PM   Specimen: BLOOD RIGHT ARM  Result Value Ref Range Status   Specimen Description   Final    BLOOD RIGHT ARM Performed at [facility], [address] [place], [place] [address]    Special Requests   Final    BOTTLES DRAWN AEROBIC AND ANAEROBIC Blood Culture adequate volume Performed at [facility], [address], [place], [place] [address]    Culture  Setup Time    Final    GRAM POSITIVE COCCI IN CLUSTERS IN BOTH AEROBIC AND ANAEROBIC BOTTLES CRITICAL VALUE NOTED.  VALUE IS CONSISTENT WITH PREVIOUSLY REPORTED AND CALLED VALUE. Performed at [facility], [address] [place], [place] [address]    Culture (A)  Final    STAPHYLOCOCCUS AUREUS STAPHYLOCOCCUS EPIDERMIDIS    Report Status PENDING  Incomplete  Blood Culture (routine x 2)     Status: Abnormal (Preliminary result)   Collection Time: 04/21/23  8:48 PM   Specimen: BLOOD RIGHT ARM  Result Value Ref Range Status   Specimen Description   Final    BLOOD RIGHT ARM Performed at [facility], [address] [place], [place] [address]    Special Requests   Final    BOTTLES DRAWN AEROBIC AND ANAEROBIC Blood Culture adequate volume Performed at [facility], [address] [place], [place] [address]    Culture  Setup Time   Final    GRAM POSITIVE COCCI IN CLUSTERS IN BOTH AEROBIC AND ANAEROBIC BOTTLES CRITICAL RESULT CALLED TO, READ BACK BY AND VERIFIED WITH: PHARMD N BATCHELDER [id number] AT 1419 BY CM    Culture (A)  Final    STAPHYLOCOCCUS AUREUS SUSCEPTIBILITIES TO FOLLOW STAPHYLOCOCCUS HOMINIS CULTURE REINCUBATED FOR BETTER GROWTH Performed at [facility], [address] [place], [place] [address]    Report Status PENDING  Incomplete  Blood Culture ID Panel (Reflexed)     Status: Abnormal   Collection Time: 04/21/23  8:48 PM  Result Value Ref Range Status   Enterococcus faecalis NOT DETECTED NOT DETECTED Final   Enterococcus Faecium NOT DETECTED NOT DETECTED Final   Listeria monocytogenes NOT DETECTED NOT DETECTED Final   Staphylococcus species DETECTED (A) NOT DETECTED Final    Comment: CRITICAL RESULT CALLED TO, READ BACK BY AND VERIFIED WITH: PHARMF N BATCHELDER [id number] AT 1420 BY CM    Staphylococcus aureus (BCID) DETECTED (A) NOT DETECTED Final  Comment: Methicillin (oxacillin)-resistant Staphylococcus aureus (MRSA). MRSA is  predictably resistant to beta-lactam antibiotics (except ceftaroline). Preferred therapy is vancomycin  unless clinically contraindicated. Patient requires contact precautions if  hospitalized. CRITICAL RESULT CALLED TO, READ BACK BY AND VERIFIED WITH: PHARMD N BATCHELDER [id number] AT 1420 BY CM    Staphylococcus epidermidis DETECTED (A) NOT DETECTED Final    Comment: CRITICAL RESULT CALLED TO, READ BACK BY AND VERIFIED WITH: PHARMD N BATCHELDER [id number] AT 1420 BY CM    Staphylococcus lugdunensis NOT DETECTED NOT DETECTED Final   Streptococcus species NOT DETECTED NOT DETECTED Final   Streptococcus agalactiae NOT DETECTED NOT DETECTED Final   Streptococcus pneumoniae NOT DETECTED NOT DETECTED Final   Streptococcus pyogenes NOT DETECTED NOT DETECTED Final   A.calcoaceticus-baumannii NOT DETECTED NOT DETECTED Final   Bacteroides fragilis NOT DETECTED NOT DETECTED Final   Enterobacterales NOT DETECTED NOT DETECTED Final   Enterobacter cloacae complex NOT DETECTED NOT DETECTED Final   Escherichia coli NOT DETECTED NOT DETECTED Final   Klebsiella aerogenes NOT DETECTED NOT DETECTED Final   Klebsiella oxytoca NOT DETECTED NOT DETECTED Final   Klebsiella pneumoniae NOT DETECTED NOT DETECTED Final   Proteus species NOT DETECTED NOT DETECTED Final   Salmonella species NOT DETECTED NOT DETECTED Final   Serratia marcescens NOT DETECTED NOT DETECTED Final   Haemophilus influenzae NOT DETECTED NOT DETECTED Final   Neisseria meningitidis NOT DETECTED NOT DETECTED Final   Pseudomonas aeruginosa NOT DETECTED NOT DETECTED Final   Stenotrophomonas maltophilia NOT DETECTED NOT DETECTED Final   Candida albicans NOT DETECTED NOT DETECTED Final   Candida auris NOT DETECTED NOT DETECTED Final   Candida glabrata NOT DETECTED NOT DETECTED Final   Candida krusei NOT DETECTED NOT DETECTED Final   Candida parapsilosis NOT DETECTED NOT DETECTED Final   Candida tropicalis NOT DETECTED NOT DETECTED Final    Cryptococcus neoformans/gattii NOT DETECTED NOT DETECTED Final   Methicillin resistance mecA/C DETECTED (A) NOT DETECTED Final    Comment: CRITICAL RESULT CALLED TO, READ BACK BY AND VERIFIED WITH: PHARMD N BATCHELDER [id number] AT 1420 BY CM    Meth resistant mecA/C and MREJ DETECTED (A) NOT DETECTED Final    Comment: CRITICAL RESULT CALLED TO, READ BACK BY AND VERIFIED WITH: MAYA LOISE DEE [id number] AT 1420 CM Performed at [facility], [address] [place], [place] [address]   Anaerobic culture w Gram Stain     Status: None (Preliminary result)   Collection Time: 04/22/23  4:59 PM   Specimen: Urine, Catheterized  Result Value Ref Range Status   Specimen Description URINE, CATHETERIZED RIGHT KIDNEY  Final   Special Requests NONE  Final   Gram Stain   Final    WBC PRESENT,BOTH PMN AND MONONUCLEAR GRAM POSITIVE COCCI IN CLUSTERS CYTOSPIN SMEAR Performed at [facility], [address], [place], [place] [address]    Culture PENDING  Incomplete   Report Status PENDING  Incomplete     Time coordinating discharge: Over 30 minutes  SIGNED:   Elsie JAYSON Montclair, DO [facility] 04/23/2023, 1:00 PM Pager   If 7PM-7AM, please contact night-coverage [email or web address]

## 2023-04-23 NOTE — TOC Transition Note (Signed)
 Transition of Care [facility]) - Discharge Note   Patient Details  Name: Noah Rosales MRN: [id number] Date of Birth: 05-04-1960  Transition of Care [facility] CM/SW Contact:  NORMAN ASPEN, LCSW Phone Number: 04/23/2023, 1:53 PM   Clinical Narrative:    Alerted by MD that pt is medically ready to return home.  Have confirmed with pt and s/o, [name] that pt is followed by [facility].  I have confirmed with [facility]) that pt has all needed DME in the home.  Per s/o,   there are things that need to be fixed on the bed and she understands that she needs to follow up with the hospice RN about those issues.  [name] note will need ambulance transport home and Hospice liaison asks that [facility] be used - called at 1:50pm.  No further TOC needs.     Final next level of care: Home w Hospice Care Barriers to Discharge: Barriers Resolved   Patient Goals and CMS Choice Patient states their goals for this hospitalization and ongoing recovery are:: return home          Discharge Placement                       Discharge Plan and Services Additional resources added to the After Visit Summary for                  DME Arranged: N/A DME Agency: NA                  Social Drivers of Health (SDOH) Interventions SDOH Screenings   Food Insecurity: No Food Insecurity (04/22/2023)  Housing: Low Risk  (04/22/2023)  Transportation Needs: No Transportation Needs (04/22/2023)  Utilities: Not At Risk (04/22/2023)  Tobacco Use: High Risk (04/21/2023)     Readmission Risk Interventions    04/23/2023    1:53 PM 02/27/2023    5:07 PM  Readmission Risk Prevention Plan  Transportation Screening Complete Complete  PCP or Specialist Appt within 3-5 Days Complete Complete  HRI or Home Care Consult Complete Complete  Social Work Consult for Recovery Care Planning/Counseling Complete Complete  Palliative Care Screening Complete Complete  Medication Review Oceanographer)  Complete Complete

## 2023-04-24 LAB — URINE CULTURE: Culture: >=100000 — AB

## 2023-04-26 LAB — CULTURE, BLOOD (ROUTINE X 2): Special Requests: ADEQUATE

## 2023-04-27 LAB — CULTURE, BLOOD (ROUTINE X 2): Special Requests: ADEQUATE

## 2023-04-27 LAB — ANAEROBIC CULTURE W GRAM STAIN

## 2023-04-28 LAB — CULTURE, BLOOD (ROUTINE X 2): Culture: NO GROWTH

## 2023-05-17 DEATH — deceased
